# Patient Record
Sex: Male | Born: 1940 | Race: Black or African American | Hispanic: No | Marital: Single | Smoking: Former smoker
Health system: Southern US, Community
[De-identification: ages and names within clinical notes are randomized; demographics above are authoritative.]

## PROBLEM LIST (undated history)

## (undated) DIAGNOSIS — C679 Malignant neoplasm of bladder, unspecified: Secondary | ICD-10-CM

## (undated) DIAGNOSIS — N183 Chronic kidney disease, stage 3 unspecified: Secondary | ICD-10-CM

## (undated) DIAGNOSIS — E119 Type 2 diabetes mellitus without complications: Secondary | ICD-10-CM

## (undated) DIAGNOSIS — I739 Peripheral vascular disease, unspecified: Secondary | ICD-10-CM

## (undated) DIAGNOSIS — Z9189 Other specified personal risk factors, not elsewhere classified: Secondary | ICD-10-CM

## (undated) DIAGNOSIS — I10 Essential (primary) hypertension: Secondary | ICD-10-CM

## (undated) DIAGNOSIS — E11319 Type 2 diabetes mellitus with unspecified diabetic retinopathy without macular edema: Secondary | ICD-10-CM

## (undated) DIAGNOSIS — Z87442 Personal history of urinary calculi: Secondary | ICD-10-CM

## (undated) DIAGNOSIS — H409 Unspecified glaucoma: Secondary | ICD-10-CM

## (undated) DIAGNOSIS — Z89519 Acquired absence of unspecified leg below knee: Secondary | ICD-10-CM

## (undated) HISTORY — PX: OTHER SURGICAL HISTORY: SHX169

## (undated) HISTORY — PX: CATARACT EXTRACTION W/ INTRAOCULAR LENS  IMPLANT, BILATERAL: SHX1307

## (undated) HISTORY — PX: TRANSURETHRAL RESECTION OF BLADDER TUMOR: SHX2575

---

## 1996-03-30 HISTORY — PX: BELOW KNEE LEG AMPUTATION: SUR23

## 1997-07-25 ENCOUNTER — Other Ambulatory Visit: Admission: RE | Admit: 1997-07-25 | Discharge: 1997-07-25 | Payer: Self-pay | Admitting: Nephrology

## 1997-07-27 ENCOUNTER — Other Ambulatory Visit: Admission: RE | Admit: 1997-07-27 | Discharge: 1997-07-27 | Payer: Self-pay | Admitting: Nephrology

## 1998-03-27 ENCOUNTER — Ambulatory Visit (HOSPITAL_COMMUNITY): Admission: RE | Admit: 1998-03-27 | Discharge: 1998-03-27 | Payer: Self-pay | Admitting: Gastroenterology

## 2001-03-04 ENCOUNTER — Encounter: Payer: Self-pay | Admitting: Ophthalmology

## 2001-03-07 ENCOUNTER — Ambulatory Visit (HOSPITAL_COMMUNITY): Admission: RE | Admit: 2001-03-07 | Discharge: 2001-03-07 | Payer: Self-pay | Admitting: Ophthalmology

## 2001-05-31 ENCOUNTER — Ambulatory Visit (HOSPITAL_COMMUNITY): Admission: RE | Admit: 2001-05-31 | Discharge: 2001-05-31 | Payer: Self-pay | Admitting: Ophthalmology

## 2002-04-29 ENCOUNTER — Encounter: Payer: Self-pay | Admitting: Nephrology

## 2002-04-29 ENCOUNTER — Ambulatory Visit (HOSPITAL_COMMUNITY): Admission: RE | Admit: 2002-04-29 | Discharge: 2002-04-29 | Payer: Self-pay | Admitting: Nephrology

## 2002-05-08 ENCOUNTER — Ambulatory Visit (HOSPITAL_COMMUNITY): Admission: RE | Admit: 2002-05-08 | Discharge: 2002-05-08 | Payer: Self-pay | Admitting: Ophthalmology

## 2002-05-08 ENCOUNTER — Encounter: Payer: Self-pay | Admitting: Ophthalmology

## 2002-11-27 ENCOUNTER — Ambulatory Visit (HOSPITAL_COMMUNITY): Admission: RE | Admit: 2002-11-27 | Discharge: 2002-11-27 | Payer: Self-pay | Admitting: Ophthalmology

## 2003-02-05 ENCOUNTER — Ambulatory Visit (HOSPITAL_COMMUNITY): Admission: RE | Admit: 2003-02-05 | Discharge: 2003-02-05 | Payer: Self-pay | Admitting: Ophthalmology

## 2004-03-06 ENCOUNTER — Ambulatory Visit (HOSPITAL_COMMUNITY): Admission: RE | Admit: 2004-03-06 | Discharge: 2004-03-06 | Payer: Self-pay | Admitting: Gastroenterology

## 2004-09-22 ENCOUNTER — Ambulatory Visit (HOSPITAL_COMMUNITY): Admission: RE | Admit: 2004-09-22 | Discharge: 2004-09-22 | Payer: Self-pay | Admitting: Ophthalmology

## 2004-10-28 ENCOUNTER — Ambulatory Visit (HOSPITAL_COMMUNITY): Admission: RE | Admit: 2004-10-28 | Discharge: 2004-10-28 | Payer: Self-pay | Admitting: Ophthalmology

## 2005-01-07 ENCOUNTER — Ambulatory Visit (HOSPITAL_COMMUNITY): Admission: RE | Admit: 2005-01-07 | Discharge: 2005-01-07 | Payer: Self-pay | Admitting: Ophthalmology

## 2005-06-09 ENCOUNTER — Ambulatory Visit (HOSPITAL_COMMUNITY): Admission: RE | Admit: 2005-06-09 | Discharge: 2005-06-09 | Payer: Self-pay | Admitting: Ophthalmology

## 2006-04-19 ENCOUNTER — Encounter: Payer: Self-pay | Admitting: Vascular Surgery

## 2006-04-19 ENCOUNTER — Ambulatory Visit: Admission: RE | Admit: 2006-04-19 | Discharge: 2006-04-19 | Payer: Self-pay | Admitting: Orthopedic Surgery

## 2006-04-22 ENCOUNTER — Ambulatory Visit (HOSPITAL_COMMUNITY): Admission: RE | Admit: 2006-04-22 | Discharge: 2006-04-23 | Payer: Self-pay | Admitting: Orthopedic Surgery

## 2006-04-22 HISTORY — PX: OTHER SURGICAL HISTORY: SHX169

## 2006-11-18 ENCOUNTER — Encounter: Admission: RE | Admit: 2006-11-18 | Discharge: 2006-11-18 | Payer: Self-pay | Admitting: Nephrology

## 2008-03-05 ENCOUNTER — Ambulatory Visit (HOSPITAL_COMMUNITY): Admission: RE | Admit: 2008-03-05 | Discharge: 2008-03-05 | Payer: Self-pay | Admitting: Ophthalmology

## 2009-01-07 ENCOUNTER — Encounter: Admission: RE | Admit: 2009-01-07 | Discharge: 2009-01-07 | Payer: Self-pay | Admitting: Orthopedic Surgery

## 2010-02-03 ENCOUNTER — Encounter: Admission: RE | Admit: 2010-02-03 | Discharge: 2010-02-03 | Payer: Self-pay | Admitting: Nephrology

## 2010-03-12 ENCOUNTER — Ambulatory Visit
Admission: RE | Admit: 2010-03-12 | Discharge: 2010-03-13 | Payer: Self-pay | Source: Home / Self Care | Attending: Urology | Admitting: Urology

## 2010-06-10 LAB — GLUCOSE, CAPILLARY
Glucose-Capillary: 171 mg/dL — ABNORMAL HIGH (ref 70–99)
Glucose-Capillary: 226 mg/dL — ABNORMAL HIGH (ref 70–99)

## 2010-06-10 LAB — POCT I-STAT 4, (NA,K, GLUC, HGB,HCT)
Hemoglobin: 11.6 g/dL — ABNORMAL LOW (ref 13.0–17.0)
Potassium: 3.7 mEq/L (ref 3.5–5.1)

## 2010-07-16 ENCOUNTER — Ambulatory Visit (HOSPITAL_BASED_OUTPATIENT_CLINIC_OR_DEPARTMENT_OTHER)
Admission: RE | Admit: 2010-07-16 | Discharge: 2010-07-16 | Disposition: A | Discharge status: Home / Self Care | Payer: Medicare Other | Source: Ambulatory Visit | Attending: Urology | Admitting: Urology

## 2010-07-16 DIAGNOSIS — Z01812 Encounter for preprocedural laboratory examination: Secondary | ICD-10-CM | POA: Insufficient documentation

## 2010-07-16 DIAGNOSIS — C679 Malignant neoplasm of bladder, unspecified: Secondary | ICD-10-CM | POA: Insufficient documentation

## 2010-07-16 LAB — POCT I-STAT 4, (NA,K, GLUC, HGB,HCT)
Glucose, Bld: 127 mg/dL — ABNORMAL HIGH (ref 70–99)
HCT: 35 % — ABNORMAL LOW (ref 39.0–52.0)

## 2010-07-22 NOTE — Op Note (Signed)
NAMEKRISTINE, TILEY NO.:  [id number]  MEDICAL RECORD NO.:  [id number]          PATIENT TYPE:  LOCATION:                                 FACILITY:  PHYSICIAN:  Bertram Millard. Mckaylee Dimalanta, M.D.DATE OF BIRTH:  1941/02/22  DATE OF PROCEDURE:  07/16/2010 DATE OF DISCHARGE:                              OPERATIVE REPORT   PREOPERATIVE DIAGNOSIS:  Recurrent stage Ta, low-grade urothelial carcinoma of the bladder.  POSTOPERATIVE DIAGNOSIS:  Recurrent stage Ta, low-grade urothelial carcinoma of the bladder.  PRINCIPAL PROCEDURE:  Transurethral resection of bladder tumor/cauterization of anterolateral bladder lesion.  SURGEON:  Bertram Millard. Navina Wohlers, MD  ANESTHESIA:  General with LMA.  COMPLICATIONS:  None.  BRIEF HISTORY:  This 70 year old male presents at this time for treatment of recurrent urothelial carcinoma.  He underwent TURBT on March 12, 2010.  Pathology revealed low-grade urothelial papillary carcinoma with several areas of focal high-grade morphology.  There was no evidence of high-grade disease.  Recent followup revealed the patient to have a recurrence in his anterolateral bladder neck.  It was recommended that he undergo resection/cauterization of this.  Risks and complications have been discussed with the patient who presents today for that procedure.  DESCRIPTION OF PROCEDURE:  The patient was identified in the holding area and received preoperative IV antibiotics.  He was taken to the operating room where general anesthetic was administered using the LMA. He was placed in the dorsal lithotomy position.  Genitalia and perineum were prepped and draped.  Time-out was then performed.  The procedure then commenced.  A 28-French resectoscope sheath was placed transurethrally with the obturator.  I then placed the resectoscope element with the cutting loop.  Survey of the bladder was then performed.  There were several areas of old resection scar,  but no evidence of recurrence except for the anterolateral bladder wall at approximately 2 o'clock.  This was pretty tough to get to with the resectoscope, but using suprapubic pressure, I was able to cauterize this area.  The recurrence looked fairly broad, but it was papillary in nature.  Due to the difficulty of resecting this, as the patient had voluntary inspirations, I basically just cauterized this area.  Attempt at resection was made, but because of the patient's breathing, a small perforation was made, and I felt it safest to just cauterize this.  The entire 2-3 cm long and 1 cm wide lesion was then cauterized down to the muscular layer.  One small specimen was primarily just cautery artifact and I did not send that for as permanent specimen.  I copiously irrigated the bladder with water following cauterization. No bleeding was seen.  No other lesions were noted.  At this point, the scope was removed, and a 20-French Foley catheter was placed and the balloon filled with 10 cc of water.  It was hooked to dependent drainage.  The patient tolerated this procedure well.  He was awakened and then taken to PACU in a stable condition.  He will follow up in my office on Aug 07, 2010, and was sent home on hydrocodone and Cipro.     Bertram Millard. Khaleesi Gruel, M.D.  SMD/MEDQ  D:  07/16/2010  T:  07/16/2010  Job:  [id number]  cc:   Mindi Slicker. Lowell Guitar, M.D. Fax: [phone]  Electronically Signed by Marcine Matar M.D. on 07/22/2010 08:36:00 PM

## 2010-08-12 NOTE — Op Note (Signed)
Hector Sloan, HINDERLITER             ACCOUNT NO.:  [id number]   MEDICAL RECORD NO.:  [id number]          PATIENT TYPE:  AMB   LOCATION:  SDS                          FACILITY:  [facility]   PHYSICIAN:  Alford Highland. Rankin, M.D.   DATE OF BIRTH:  November 04, 1940   DATE OF PROCEDURE:  DATE OF DISCHARGE:  03/05/2008                               OPERATIVE REPORT   PREOPERATIVE DIAGNOSIS:  1. 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  2. History of proliferative diabetic retinopathy, left eye and      previous vitrectomy.   POSTOPERATIVE DIAGNOSIS:  1. 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  2. History of proliferative diabetic retinopathy, left eye and      previous vitrectomy.  3. Mild vitreous hemorrhage of left eye from placement and insertion      of pars plana seton.  4. Retained silicone oil in the anterior chamber, which has resolved      with anterior chamber washout today.   PROCEDURE:  1. Posterior vitrectomy - 20 gauge left eye.  2. Insertion of glaucoma aqueous shunt - glaucoma seton, Baerveldt of      pars plana.   SURGEON:  Alford Highland. Rankin, MD   ANESTHESIA:  Local retrobulbar, monitored anesthesia control.   INDICATIONS FOR PROCEDURE:  The patient is a 70 year old man who has  profound visual loss from proliferative diabetic retinopathy and now has  progressive optic nerve peripheral vision loss, optic nerve diameter  from glaucoma, which is on maximum tolerated medical therapy in left  eye, but requires surgical intervention from his underlying scarring  disease.  He understands this is an attempt to place a glaucoma aqueous  shunt, so as to allow for intraocular pressure control measure.  He  understands the risk of anesthesia including rate occurrence of death,  loss to the eye including but not limited to hemorrhage, infection,  scarring, need for another surgery, no change in vision, loss of vision,  progressive disease despite intervention.   PROCEDURE IN  DETAIL:  Appropriate signed consent was obtained. The  patient was taken to the operating room.  In the operating room,  appropriate monitors followed by mild sedation.  A 2% Xylocaine  injected, 5 mL retrobulbar with additional 5 mL laterally in fashion of  modified Darel Hong. The left periocular region was then sterilely prepped  and draped in usual sterile fashion.  The lid speculum applied.  Conjunctival peritomy was then fashioned superiorly.  The superior  rectus and lateral rectus muscles isolated with 2-0 silk ties.  Superior  temporal quadrant was opened.  The BG 102-350 Baerveldt implant was  selected for placement via the pars plana.  This was secured into the  superior temporal quadrant with wings under each of the rectus muscles  as mentioned above.  A 4-mm posterior limbus MVR, 20-gauge MVR blade  were used to create an opening for the foot plate and a seton.  This was  placed and secured in place with three 2-0 nylon sutures.  Thereafter,  the appropriate placement of the foot plate was secured also with a 8-0  nylon suture to the sclera.  The intraocular pressure had been  controlled during this portion of procedure by placement of Lewicky  anterior chamber maintainer prior to placement of the seton into the  vitreous cavity.  Separate 20-gauge incisions were then made in superior  temporal and the superior nasal quadrant, so as to allow vitrectomy.  Vitrectomy was then carried out with the BIOM attachment on the  microscope.  It was clear that there was a small amount of vitreous  hemorrhage that must have been introduced with the glaucoma procedure.  Previous vitreous had been removed.  The preretinal hemorrhage was thus  removed in this fashion.  Peripheral retina inspected and found to be  free of retinal holes and tears.  Prior to placement of seton at the  sclerectomy site, vitrectomy was performed so as to clear any small  residual vitreous on the vitreous base.  This  was not confirmed  visually.   At this time, the instruments were removed from the interior of the eye,  superior sclerotomies were closed with 7-0 Vicryl sutures.  Infusion was  controlled and maintained so as to allow for egress through the seton  with pressure above 30.  This was titrated and suture was placed around  the free tube as so as to control flow below the pressures of 30.   A Tutoplast was then placed overlying the foot plate, secured in place  with 7-0 Vicryl sutures.  The Bugbee had been used to soak the Tutoplast  as well as the implant.  Now, the conjunctiva was then brought for and  closed without tension with multiple interrupted 7-0 Vicryl sutures.  Excellent closures were obtained.  Subconjunctival Decadron applied  inferonasally.  Intraocular pressures were assessed and were adequate.  Sterile patch and Fox shield were applied.  The patient tolerated the  procedure well without complication.  The patient was taken to the  recovery room in good stable condition.      Alford Highland Rankin, M.D.  Electronically Signed     GAR/MEDQ  D:  03/05/2008  T:  03/06/2008  Job:  [id number]

## 2010-08-15 NOTE — Op Note (Signed)
[place]  Patient:    Hector Sloan, Hector Sloan Visit Number: [id number] MRN: [id number]          Service Type: DSU Location: [facility] 2873 [id number] Attending Physician:  Karenann Cai Dictated by:   Marya Landry Carlyle Lipa., M.D. Admit Date:  03/07/2001                             Operative Report  PREOPERATIVE DIAGNOSIS:  Immature cataract, left eye.  POSTOPERATIVE DIAGNOSIS:  Immature cataract, left eye.  PROCEDURE:  Kelman phacoemulsification of cataract, left eye, with intraocular lens implantation.  ANESTHESIA:  Local using Xylocaine 2% with Marcaine 0.75% and Wydase.  JUSTIFICATION FOR PROCEDURE:  This is a 70 year old diabetic gentleman who complains of blurring of vision with difficulty seeing to read.  He has been treated in both eyes with laser treatment for diabetic eye disease.  His visual acuity is best corrected at 20/70 on the right, 20/100 on the left. There are bilateral immature cataracts.  Cataract extraction with intraocular lens implantation is recommended, and he is admitted at this time for that purpose.  DESCRIPTION OF PROCEDURE:  Under the influence of IV sedation and Van Lint akinesia, a retrobulbar anesthesia was given.  The patient was prepped and draped in the usual manner.  The lid speculum was inserted under the upper and lower lid of the left eye, and a 4-0 silk traction suture was passed through the belly of the superior rectus muscle for traction.  A fornix-based conjunctival flap was turned and hemostasis achieved by using cautery.  An incision made in the sclera approximately 1 mm posterior to the limbus.  This incision was dissected down to clear cornea using the crescent blade.  A side port incision was made at the 1:30 clock hour position, and Occucoat was injected into the eye through the side port incision.  The anterior chamber was then entered through the corneoscleral tunnel incision at the  11:30 position using a 2.7 mm keratome.  An anterior capsulotomy was done using a bent 25-gauge needle.  The nucleus was hydrodissected using Xylocaine.  The KPE handpiece was passed into the eye, and the nucleus was emulsified without difficulty.  The residual cortical material was aspirated.  The posterior capsule was polished using the olive-tip polisher.  The wound was widened slightly to accommodate an oval 5 x 6 intraocular lens.  This lens was seated into the eye behind the iris without difficulty.  The anterior chamber was reformed, and the pupil was constricted using Miochol.  The corneoscleral wound was closed by using a single horizontal suture of 10-0 nylon.  Before closing the suture, the residual Occucoat was aspirated from the eye. After it was ascertained that it was watertight, the conjunctiva was closed over the wounds using thermal cautery.  Celestone 1 cc and 0.5 cc of gentamicin were injected subconjunctivally.  Maxitrol ophthalmic ointment and Pilopine ointment were applied along with a patch and Fox shield.  The patient tolerated the procedure well, was discharged to postanesthesia recovery room in satisfactory condition.  He was instructed to rest today, to take Vicodin every four hours as needed for pain, and to see me in my office tomorrow for further evaluation.  DISCHARGE DIAGNOSIS:  Immature cataract, left eye. Dictated by:   Marya Landry Carlyle Lipa., M.D. Attending Physician:  Karenann Cai DD:  03/07/01 TD:  03/07/01 Job: [id number]

## 2010-08-15 NOTE — Op Note (Signed)
Hector Sloan, Hector Sloan             ACCOUNT NO.:  [id number]   MEDICAL RECORD NO.:  [id number]          PATIENT TYPE:  AMB   LOCATION:  SDS                          FACILITY:  [facility]   PHYSICIAN:  Alford Highland. Rankin, M.D.   DATE OF BIRTH:  04/30/1940   DATE OF PROCEDURE:  06/09/2005  DATE OF DISCHARGE:  06/09/2005                                 OPERATIVE REPORT   PREOPERATIVE DIAGNOSES:  1.  Retained silicone oil, foreign body, left eye.  2.  Progressive proliferative diabetic retinopathy, left eye.  3.  Secondary glaucoma, left eye, secondary to #1.   POSTOPERATIVE DIAGNOSES:  1.  Retained silicone oil, foreign body, left eye.  2.  Progressive proliferative diabetic retinopathy, left eye.  3.  Secondary glaucoma, left eye, secondary to #1.  4.  Oil retained in the anterior chamber, left eye.   OPERATION PERFORMED:  1.  Posterior vitrectomy with endolaser panphotocoagulation, left eye.  2.  Removal of retained oil, nonmagnetic foreign body, left eye.  3.  Anterior chamber washout, therapeutic.   SURGEON:  Alford Highland. Rankin, M.D.   ANESTHESIA:  Local retrobulbar with monitored anesthesia control.   INDICATIONS FOR PROCEDURE:  The patient is a 70 year old man who has complex  retinal detachment, proliferative diabetic retinopathy, tractional  detachment requiring previous vitrectomy, placement of silicone oil in the  vitreous cavity.  He now has early signs of secondary glaucoma with  elevation of intraocular pressure.  This is an attempt to remove the oil and  to diminish that risk of secondary glaucoma developing and worsening.  The  patient understands the risks of anesthesia including the rare occurrence of  death but also to the eye including but not limited to hemorrhage,  infection, scarring, need for another surgery, no change in vision, loss of  vision and progression of disease despite intervention.  After appropriate  signed consent was obtained, the patient was taken to the  operating room.   DESCRIPTION OF PROCEDURE:  In the operating room, appropriate monitoring was  followed by mild sedation.  0.75% Marcaine was delivered 5 mL retrobulbar  followed by an additional 5 mL laterally in the fashion of modified Darel Hong.  The  periocular region was sterilely prepped and draped in the usual  ophthalmic fashion.  A lid speculum was applied.  Conjunctival peritomy was  fashioned in each of the quadrants with the exception of inferonasally.  The  infusion cannula was placed in the infratemporal quadrant.  Placement in the  vitreous cavity verified visually.  At this time, viscous fluid extraction  was then carried out with an 18 gauge needle attached.  The oil was removed  rapidly and quickly.  Multiple emulsified bubbles were found in the vitreous  cavity.  Multiple fluid-air exchanges were then carried out to help mobilize  and to remove the surface oil that accumulates.  Under fluid, endolaser  panphotocoagulation was attempted and portions were completed but there did  appear later to be a laser failure.  This portion of the procedure had to be  halted.  There was no bleeding.  There was  no active NV.  An area nasal to  the optic nerve that had not been previously treated, would need to be  treated and was done partially so.  At this time  superior sclerotomies were then closed with 7-0 Vicryl.  The infusion  removed and similarly closed after an anterior chamber  washout had been  carried out.  Conjunctiva closed with 7-0 Vicryl.  Subconjunctival injection  of antibiotic and steroid applied.  The patient tolerated the procedure well  without complication.      Alford Highland Rankin, M.D.  Electronically Signed     GAR/MEDQ  D:  06/09/2005  T:  06/10/2005  Job:  [id number]
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
## 2010-08-15 NOTE — Op Note (Signed)
Hector Sloan, Hector Sloan NO.:  [id number]   MEDICAL RECORD NO.:  [id number]                   PATIENT TYPE:  OIB   LOCATION:  2861                                 FACILITY:  [facility]   PHYSICIAN:  Alford Highland. Rankin, M.D.                DATE OF BIRTH:  08/31/1940   DATE OF PROCEDURE:  05/08/2002  DATE OF DISCHARGE:                                 OPERATIVE REPORT   PREOPERATIVE DIAGNOSES:  1. Retinal detachment, left eye.  2. Vitreous hemorrhage, left eye, secondary to No. 1 and No. 4.  3. 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  4. Progressive proliferative diabetic retinopathy, left eye.   POSTOPERATIVE DIAGNOSES:  1. Retinal detachment, left eye.  2. Vitreous hemorrhage, left, eye, secondary to No. 1 and No. 4.  3. Rhegmatogenous, left eye, secondary to No. 1.  4. Progressive proliferative diabetic retinopathy, left eye.   PROCEDURES:  1. Posterior vitrectomy with membrane peel, left eye.  2. Endolaser photocoagulation, left eye.  3. Injection of vitreous substitute to reattached retina using SF6 by 15%.   SURGEON:  Alford Highland. Rankin, M.D.   ANESTHESIA:  Local retrobulbar anesthesia with control.   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  This is an attempt to release the dense  fibrovascular attachments in these areas and to reattach retina, as well as  preserve visual functioning and induce quiescence of proliferative diabetic  retinopathy.  He understands the risks of anesthesia including, but not  limited to, hemorrhage, infection, scarring, need for more surgery, no  change in vision, loss of vision, progression of disease despite  intervention, as well as death under the anesthesia.  The ocular conditions  are  obvious side effects of the progress disease and/or surgical  intervention in this attempt to induce quiescence.  He wishes to proceed  with anesthesia as well as surgical repair.   DESCRIPTION OF PROCEDURE:  After appropriate signed consent was obtained he  was taken to the operating room.  In the operating room, appropriate  monitoring was followed by mild sedation.  Then, 0.5% Marcaine 5 cc.  retrobulbar with additional 5 cc. in the fashion of modified Darel Hong.  The  left periocular regions were sterilely prepped and draped in the usual  sterile fashion.  A lid speculum was applied.  Conjunctival peritomy was  fashioned temporally and superonasally.  A 4 mm. infusion was secured 3.5  mm. posterior to the limbus in the inferotemporal quadrant.  Placement in  the vitreous cavity verified visually.  The superior sclerotomy was then  fashioned.  The [employer] was placed into position and BIOM attached.  Core vitrectomy was then begun.  Modified En bloc dissection was then  carried out to remove the dense fibrovascular attachments along the  supratemporal, infratemporal and the nasal vascular cage.  High anterior  posterior traction was released.  The vitreous skirt was trimmed 360 degrees  using scleral depression as well.  At this time, fluid exchange was then  carried out through the base of the retinal holes at the base of the  fibrovascular proliferations.  Thereafter, fluid air exchange was then  completed.  The retinal reattached nicely.  Endolaser photocoagulation was  then carried out peripherally, as well in the bed of the previous  detachments. The retina was reattached nicely.   At this time the instruments were removed from the eye.  Superior sclerotomy  was then closed.  An air _____ [facility] by 15% exchange was then completed.  At  this time the remaining sclerotomies were then closed, and the infusion  removed and similarly closed with 7-0 Vicryl suture. The conjunctivae  closed  with 7-0 Vicryl suture.  Subconjunctival injection of antibiotic and steroid  applied.  The patient tolerated the procedure without complication.                                                 Alford Highland Rankin, M.D.    GAR/MEDQ  D:  05/08/2002  T:  05/08/2002  Job:  [id number]

## 2010-08-15 NOTE — Op Note (Signed)
Hector Sloan, Hector Sloan NO.:  [id number]   MEDICAL RECORD NO.:  [id number]          PATIENT TYPE:  INP   LOCATION:  2899                         FACILITY:  [facility]   PHYSICIAN:  Alford Highland. Rankin, M.D.   DATE OF BIRTH:  April 08, 1940   DATE OF PROCEDURE:  01/07/2005  DATE OF DISCHARGE:  01/07/2005                                 OPERATIVE REPORT   PREOPERATIVE DIAGNOSIS:  1.  Neovascular glaucoma, OD.  2.  Progressive proliferative diabetic retinopathy OD despite previous      panphotocoagulation.  3.  Recent pseudophakia, OD.  4.  Vitreous hemorrhage, OD.   POSTOPERATIVE DIAGNOSIS:  1.  Neovascular glaucoma, OD.  2.  Progressive proliferative diabetic retinopathy OD despite previous      panphotocoagulation.  3.  Recent pseudophakia, OD.  4.  Vitreous hemorrhage, OD.   PROCEDURE:  1.  Posterior vitrectomy with endolaser panphotocoagulation, OD.  2.  Insertion of glaucoma-aqueous shunt, Baerveldt via the pars plana, OD.   ANESTHESIA:  Local retrobulbar block with monitored anesthesia care.   SURGEON:  Alford Highland. Rankin, M.D.   INDICATIONS FOR PROCEDURE:  The patient is a 70 year old man with recent  pseudophakia and rapid progressive diabetic retinopathy with neovascular  glaucoma and iris rubeosis.  This is an attempt to therapeutically lower the  intraocular pressure but also to deliver definitive panphotocoagulation  additional to previous that had been applied in order to induce quiescence  of diabetic retinopathy and to salvage the globe and to salvage useful  vision in this right eye.  The patient understands the risks of anesthesia  including the rare occurrence of death and also to the eye including but not  limited to hemorrhage, infection, scarring, need for further surgery, no  change in vision, loss of vision, progression of disease despite  intervention.  Appropriate signed consent was obtained.  The patient was  taken to the operating room.   DESCRIPTION OF PROCEDURE:  In the operating room, appropriate monitoring was  followed by mild sedation.  0.75% Marcaine was delivered 5 mL retrobulbar  and an additional 5 mL laterally fashioned in modified [employer]  The right  periocular region was sterilely prepped and draped in the usual ophthalmic  fashion.  A lid speculum was applied.  A conjunctival peritomy was then  fashioned superonasally and temporally.  Inferotemporally, as well.  The  infusion cannula was placed 3.5 mm posterior to the limbus in the  inferotemporal quadrant.  Placement was verified visually in the vitreous  cavity.  A superior sclerotomy was fashioned.  The rectus muscle was  isolated on 2-0 silk ties, the superior and lateral rectus muscles prior to  the placement of the superior sclerotomy.  At this time, core vitrectomy was  then begun using the BIOM attachment to the microscope.  It was necessary to  create iatrogenic posterior hyaloid detachment with active suction nasal to  the optic nerve and the hyaloid was then elevated off the optic nerve,  posterior pole, and elevated anterior to the equator 360 degrees.  The  vitreous skirt trimmed 360 degrees.  At this  time, endolaser  photocoagulation was placed 360 degrees.   At this time, care was taken to make sure that the superior vitreous was  clear of the superotemporal sclerotomy.  The instruments were removed from  the eye and the superonasal sclerotomy was closed with 7-0 Vicryl.  The  Baerveldt implant was placed, the wings were placed under each of the rectus  muscles in the superotemporal quadrant and the footplate was secured and fed  to the pars plana and secured with 8-0 nylon.  The implant had been  irrigated previous to this with BSS.  At this time, the baseplate was also  secured with 8-0 nylon with the appropriate tension applied to the tube.  A  ligature of 7-0 Vicryl was placed along the tube to allow for egress of  aqueous and fluid from  the vitreous cavity at pressures approximately above  30.  At this time, a Tutoplast was used to cover the footplate and secured  with vertical mattress sutures of 8-0 nylon.  The conjunctiva was then  irrigated with the bug juice.  The conjunctiva was then brought forward and  closed to the limbus with interrupted 7-0 Vicryl suture.  The 2-0 silks had  been previously removed.  The infusion was also removed and similarly closed  with 7-0 Vicryl.   At this time, subconjunctival injection of antibiotics were applied.  A  sterile patch and Fox shield were applied to the right eye.  The patient was  taken to the PACU in good, stable condition.      Alford Highland Rankin, M.D.  Electronically Signed     GAR/MEDQ  D:  01/07/2005  T:  01/07/2005  Job:  [id number]   cc:   Salley Scarlet., M.D.  Fax: [phone]

## 2010-08-15 NOTE — Op Note (Signed)
Hector Sloan, Hector Sloan NO.:  [id number]   MEDICAL RECORD NO.:  [id number]                   PATIENT TYPE:  OIB   LOCATION:  2899                                 FACILITY:  [facility]   PHYSICIAN:  Alford Highland. Rankin, M.D.                DATE OF BIRTH:  [birth year]   DATE OF PROCEDURE:  11/27/2002  DATE OF DISCHARGE:                                 OPERATIVE REPORT   PREOPERATIVE DIAGNOSES:  1. Epiretinal membrane, left eye, secondary to old fibrovascular     proliferative diabetic retinopathy.  2. Traction retinal detachment, macular region, left eye, as verified by     ultrasound and optical coherence tomography (OCT) testing.   POSTOPERATIVE DIAGNOSES:  1. Epiretinal membrane, left eye, secondary to old fibrovascular     proliferative diabetic retinopathy.  2. Traction retinal detachment, macular region, left eye, as verified by     ultrasound and optical coherence tomography (OCT) testing.   PROCEDURES:  1. Posterior vitrectomy with membrane peel, left eye.  2. Endolaser panretinal photocoagulation, left eye.  3. Injection of vitreous substitute, SF6 15%, OS.   SURGEON:  Alford Highland. Rankin, M.D.   ANESTHESIA:  Local retrobulbar with monitored anesthesia control.   INDICATION FOR PROCEDURE:  The patient is a 70 year old man who has  persistent profound vision loss on the basis of traction retinal detachment  in the macular region secondary to recurrent fibrosis, progressive over the  macular region from the underlying disease of proliferative diabetic  retinopathy.  Status post previous vitrectomy and laser photocoagulation.  The patient understands that this is an attempt to release the traction  changes, flatten the surface of the macula so as to allow for the retina to  flatten if not to at least stabilize vision and perhaps improve the vision.  He understands the risks of anesthesia, including the rare occurrence of  death, and losses to the eye,  including but not limited to hemorrhage,  infection, scarring, need for another surgery, no change in vision, loss of  vision, and progression of disease despite intervention.  After appropriate  signed consent was obtained he was taken to the operating room.   In the operating room appropriate monitoring was followed by mild sedation.  Marcaine 0.75% delivered 5 mL retrobulbar followed by an additional 5 mL  laterally in the fashion of modified Darel Hong.  The left periocular region  was sterilely prepped and draped in the usual ophthalmic fashion.  A lid  speculum applied.  A conjunctival peritomy was then fashioned in each of the  quadrants with the exception of the inferonasal.  A 4 mm infusion was  secured 3.5 mm posterior to the limbus in the inferotemporal quadrant.  Placement in the vitreous cavity verified visually.  Superior sclerotomy was  then fashioned, the wall microscope placed in position with BIOM attached.  Core vitrectomy was then begun.  The previous vitrectomy  had previously been  completed.  Vitreous skirt was then inspected and found to be free of  retinal holes or tears and further trimmed slightly.  The [facility] forceps were  then used to engage the epiretinal membrane which was in a plaque-like  fashion along the superotemporal arcade and superior to the temporal arcade.  Mobilization allowed for use of modified en bloc technique to remove these  portions of the membrane with scissors delamination.   No complications occurred.  At this time a fluid-air exchange then completed  to flatten the retina.  This was done without difficulty.  Endolaser  photocoagulation was then placed in the bed of the macular detachment  temporal to the foveal region.  It was also carried out superiorly.   At this time the instruments were removed from the eye.  The superior  sclerotomy was closed with 7-0 Vicryl suture.  The infusion removed and  similarly closed with 7-0 Vicryl suture.   Conjunctiva closed.  Subconjunctival injections of antibiotic and steroid applied.  It must be  noted that after the air-fluid exchange had been completed that an air-SF6  15% exchange had been completed.   At this time a sterile patch and Fox shield applied to the left eye.  The  patient tolerated the procedure well without complication.  Discharged as an  outpatient to the short stay unit.                                               Alford Highland Rankin, M.D.    GAR/MEDQ  D:  11/27/2002  T:  11/27/2002  Job:  [id number]

## 2010-08-15 NOTE — Op Note (Signed)
Hector Sloan, Hector Sloan             ACCOUNT NO.:  [id number]   MEDICAL RECORD NO.:  [id number]          PATIENT TYPE:  AMB   LOCATION:  ENDO                         FACILITY:  [facility]   PHYSICIAN:  Jordan Hawks. Elnoria Howard, MD    DATE OF BIRTH:  Aug 05, 1940   DATE OF PROCEDURE:  03/06/2004  DATE OF DISCHARGE:                                 OPERATIVE REPORT   PROCEDURE:  Colonoscopy.   INDICATIONS:  Screening and family history of colon cancer.   REFERRING PHYSICIAN:  Merlene Laughter. Renae Gloss, M.D.   CONSENT:  Informed consent was obtained from the patient, describing the  risks of bleeding, infection, perforation, medication reactions, a 10% miss  rate for a small colon cancer or polyp, the risk of death, all of which are  not exclusive of any other complications that may occur.   PHYSICAL EXAMINATION:  CARDIAC:  Regular rate and rhythm.  CHEST:  Lungs are clear to auscultation bilaterally.  ABDOMEN:  Soft, nontender, nondistended.   MEDICATIONS:  75 mcg of fentanyl IV and 7 mg of Versed IV.   PROCEDURE:  After adequate sedation was achieved, a rectal examination was  performed, which was negative for any palpable abnormalities.  The  colonoscope was then inserted under direct visualization to the terminal  ileum with ease.  The patient was noted to have a very good prep.  Upon slow  withdrawal of the colonoscope, there was no evidence of any masses, polyps,  inflammation, ulcerations, erosions, diverticula, or vascular abnormalities  in the cecum, ascending, transverse, descending, or sigmoid colon.  Retroflexion in the rectum revealed large internal and external hemorrhoids.  The colonoscope was then straightened and withdrawn from the patient.   PLAN:  1.  Repeat colonoscopy in five years because of the family history of colon      cancer.  2.  Maintain a high-fiber diet.       PDH/MEDQ  D:  03/06/2004  T:  03/06/2004  Job:  [id number]   cc:   Merlene Laughter. Renae Gloss, M.D.  [address]  [address]  [place]  [place] [address]  Fax: [phone]

## 2010-08-15 NOTE — Op Note (Signed)
NAMEREYNALD, WOODS NO.:  [id number]   MEDICAL RECORD NO.:  [id number]          PATIENT TYPE:  OIB   LOCATION:  2872                         FACILITY:  [facility]   PHYSICIAN:  Salley Scarlet., M.D.DATE OF BIRTH:  June 16, 1940   DATE OF PROCEDURE:  09/22/2004  DATE OF DISCHARGE:  09/22/2004                                 OPERATIVE REPORT   PREOPERATIVE DIAGNOSIS:  Immature cataract right eye.   POSTOPERATIVE DIAGNOSIS:  Immature cataract right eye.   OPERATION:  Extracapsular cataract extraction with intraoperative lens  implantation.   ANESTHESIA:  Local with the use of Xylocaine, 2% Marcaine, 0.75% Wydase.   JUSTIFICATION FOR PROCEDURE:  This is a 70 year old gentleman with diabetes  and glaucoma who underwent a cataract extraction from the left eye several  years ago.  He subsequently developed a retinal detachment in that eye  associated with severe diabetic retinopathy.  He has had retinal detachment  surgery as well as treatment of his diabetic eye disease by Dr. Luciana Axe.  He  has had a gradual decrease in visual acuity on the right side so that now he  has a visual acuity best corrected to 20/100 on the right and hand motion on  the left.  He has a dense posterior subcapsular cataract on the right,  intraocular lens present on the left.  The patient requested the cataract to  be extracted from the right eye.  After thoroughly counseling him of the  risks and benefits of cataract surgery in the only eye, the patient is  admitted now for cataract surgery with intraocular lens implantation.   PROCEDURE:  Under the influence of IV sedation, the Van Lint akinesia and  retrobulbar anesthesia was given.  The patient was prepped and draped in the  usual manner.  A lid speculum was inserted under the upper and lower lids of  the right eye, and a 4-0 silk traction suture was passed through the belly  of the superior rectus muscle retraction.  A fornix-based  conjunctival flap  was turned, and hemostasis was achieved using cautery.  An incision was made  in the sclera at the limbus.  This incision was dissected down into clear  cornea using a crescent blade.  A flap over the incision was made at the  1:30 o'clock position.  Ocucoat was injected into the eye through the  sideport incision.  Anterior chamber was then entered through the  corneoscleral tunnel incision at the 11:30 o'clock position.  An anterior  capsulotomy was then done with a bent 25-gauge needle.  The nucleus was  hydrosected using Xylocaine.  The lens was then begun to be emulsified with  the KPE handpiece, but then the pupil began to be extremely myotic.  Although the phacoemulsification procedure continued, we could not prolapse  the nucleus into the anterior chamber.  Therefore, the decision was made to  convert the procedure to an extracapsular procedure so that the remaining  nucleus could be expressed from the eye.  The corneoscleral wound was then  extended first toward the left, then toward the right, placing a single 8-0  Vicryl suture across the top of the incision, as it was made toward the  left, then toward the right.  The nucleus was then manually expressed from  the eye.  The two previously placed sutures were closed with a third one  placed at the 12:00 position.  The I/A handpiece was passed into the eye.  The residual material was aspirated.  The posterior capsule was polished  with the olive tipped polisher.  The eye was filled with Ocucoat, and an  EZ60 PMMA lens was seated into the posterior chamber without difficulty.  The anterior chamber was reformed and the pupil was constricted using  Miochol.  The corneoscleral wound was closed by using a combination of  interrupted sutures of 8-0 Vicryl and 10-0 nylon.  After ascertaining that  the wound was airtight and watertight, the conjunctiva was closed using  thermo cautery.  One cc of Celestone and 0.5 cc of  gentamicin were injected  in the subconjunctiva.  Maxitrol ophthalmic ointment and prilocaine ointment  were applied along with the patch and Fox shield.  The patient tolerated the  procedure well and was discharged to the postanesthesia care unit in  satisfactory condition.  He is instructed to keep the eye patched today, to  take Vicodin q.4 h. as needed for pain.  He is to have assistance for 24  hours around the clock today because he is unable to see from the other eye,  and his good eye is patched.  He is to see me in the office tomorrow for  further evaluation.   DISCHARGE DIAGNOSIS:  Immature cataract right eye.       TB/MEDQ  D:  09/22/2004  T:  09/23/2004  Job:  [id number]

## 2010-08-15 NOTE — Op Note (Signed)
NAMEADOLF, Hector Sloan                         ACCOUNT NO.:  [id number]   MEDICAL RECORD NO.:  [id number]                   PATIENT TYPE:  OIB   LOCATION:  2550                                 FACILITY:  [facility]   PHYSICIAN:  Alford Highland. Rankin, M.D.                DATE OF BIRTH:  June 12, 1940   DATE OF PROCEDURE:  02/05/2003  DATE OF DISCHARGE:  02/05/2003                                 OPERATIVE REPORT   PREOPERATIVE DIAGNOSIS:  1. Recurrent tractional rhegmatogenous detachment, left eye - macula.  2. Progressive proliferative diabetic retinopathy, left eye.   POSTOPERATIVE DIAGNOSIS:  1. Recurrent tractional rhegmatogenous detachment, left eye - macula.  2. Progressive proliferative diabetic retinopathy, left eye.   PROCEDURE:  1. Posterior vitrectomy with membrane peel left eye.  2. Endolaser pan photocoagulation left eye with reattachment of complex     retinal detachment.  3. Injection of vitreous substitute - silicon 1000 centistokes.   SURGEON:  Alford Highland. Rankin, M.D.   ANESTHESIA:  Local retrobulbar with monitored anesthesia care.   INDICATIONS FOR PROCEDURE:  The patient is a 70 year old man with profound  vision loss on the basis of combined tractional retinal detachment of the  left eye, recurrent.  This is an attempt to reattach the retina, removing  proliferative vitreal retinopathy tissues and to hold this in place with  laser retinopexy followed by instillation of permanent vitreous substitute.   The patient understands the risks of anesthesia including the rare  occurrence of death, but also to the eye including, but not limited to  hemorrhage, infection, scarring, need for another surgery, no change in  vision, loss of vision, and progression despite intervention.  Appropriate  signed consent was obtained.  The patient was taken to the operating room.   DESCRIPTION OF PROCEDURE:  In the operating room, appropriate monitors  followed by mild sedation.  0.75%  Marcaine delivered 5 mL retrobulbar and  additional 5 mL laterally in fashion modified Darel Hong.  Left periocular  region was sterilely prepped and draped in the usual ophthalmic fashion.  Lid speculum applied.  Conjunctival peritomy was then fashioned temporally  and superonasally.  A 4 mm infusion was secured 3.5 mm posterior to the  limbus in the inferotemporal quadrant.  Placement in the vitreous cavity  verified visually.  Superior sclerotomies were then fashion.  Wilde  microscope placed in position with Biome attachment.  Core vitrectomy was  then begun.  This had been done previously.  The vitreous skirt trimmed 360  degrees.  At this time, a large fibrovascular attachment which was atrophic  and overlying the retina which appeared to be contractile in nature and  occluding retinal attachment was then removed in modified en block  technique. This was carried out without difficulty.  Fluid/air exchange was  then completed.  The retina was reattached nicely.  Endolaser  photocoagulation was then applied in a very tight pattern  around the macula  posterior pole.  An air to silicon exchange 1000 centistokes was then  carried out.  The superior sclerotomies were then closed with 7-0 Vicryl  suture.  The infusion was removed and similarly closed with 7-0 Vicryl  suture.  At this time the superior sclerotomies were closed with 7-0 Vicryl.  Infusion  removed and similarly closed with 7-0 Vicryl.  The conjunctiva closed with 7-  0 Vicryl.  Subconjunctival injection with antibiotics and steroid applied.  The patient tolerated the procedure well without complications.  Taken to  the shortstay to be discharged and followed as an outpatient.                                               Alford Highland Rankin, M.D.    GAR/MEDQ  D:  02/05/2003  T:  02/05/2003  Job:  [id number]

## 2010-12-03 ENCOUNTER — Ambulatory Visit (HOSPITAL_BASED_OUTPATIENT_CLINIC_OR_DEPARTMENT_OTHER)
Admission: RE | Admit: 2010-12-03 | Discharge: 2010-12-03 | Disposition: A | Discharge status: Home / Self Care | Payer: Medicare Other | Source: Ambulatory Visit | Attending: Urology | Admitting: Urology

## 2010-12-03 DIAGNOSIS — I10 Essential (primary) hypertension: Secondary | ICD-10-CM | POA: Insufficient documentation

## 2010-12-03 DIAGNOSIS — E119 Type 2 diabetes mellitus without complications: Secondary | ICD-10-CM | POA: Insufficient documentation

## 2010-12-03 DIAGNOSIS — Z01812 Encounter for preprocedural laboratory examination: Secondary | ICD-10-CM | POA: Insufficient documentation

## 2010-12-03 DIAGNOSIS — C675 Malignant neoplasm of bladder neck: Secondary | ICD-10-CM | POA: Insufficient documentation

## 2010-12-03 LAB — POCT I-STAT 4, (NA,K, GLUC, HGB,HCT)
HCT: 37 % — ABNORMAL LOW (ref 39.0–52.0)
Hemoglobin: 12.6 g/dL — ABNORMAL LOW (ref 13.0–17.0)
Sodium: 137 mEq/L (ref 135–145)

## 2011-01-01 LAB — CBC
HCT: 39.5 % (ref 39.0–52.0)
Hemoglobin: 13 g/dL (ref 13.0–17.0)
MCV: 87.6 fL (ref 78.0–100.0)
RBC: 4.51 MIL/uL (ref 4.22–5.81)
RDW: 15.1 % (ref 11.5–15.5)

## 2011-01-01 LAB — BASIC METABOLIC PANEL
Calcium: 9.2 mg/dL (ref 8.4–10.5)
Potassium: 4.4 mEq/L (ref 3.5–5.1)
Sodium: 137 mEq/L (ref 135–145)

## 2011-01-01 LAB — GLUCOSE, CAPILLARY: Glucose-Capillary: 100 mg/dL — ABNORMAL HIGH (ref 70–99)

## 2011-01-01 NOTE — Op Note (Signed)
  NAMECASTEN, Hector Sloan NO.:  [id number]  MEDICAL RECORD NO.:  [id number]  LOCATION:                                 FACILITY:  PHYSICIAN:  Bertram Millard. Kerrington Sova, M.D.  DATE OF BIRTH:  DATE OF PROCEDURE:  12/03/2010 DATE OF DISCHARGE:                              OPERATIVE REPORT   PREOPERATIVE DIAGNOSIS:  Recurrent urothelial carcinoma of the bladder.  POSTOPERATIVE DIAGNOSIS:  Recurrent urothelial carcinoma of the bladder.  PRINCIPAL PROCEDURE:  TURBT, 3 cm lesion/bladder tumor fulguration.  SURGEON:  Bertram Millard. Amai Cappiello, M.D.  ANESTHESIA:  General LMA.  COMPLICATIONS:  None.  BRIEF HISTORY:  A 70 year old male who has recurrent urothelial carcinoma of the bladder.  He has had an initial resection of a very large tumor in 2011.  He had a recurrence in April 2012 and underwent repeat TURBT.  He was found on recent cystoscopy to have an anterior bladder recurrence.  There were several small tumors as well as a "carpet-like" effect with a papillary carcinoma.  At this point, as recommended that he undergo repeat resection.  He is aware of risks and complications and desires to proceed.  DESCRIPTION OF PROCEDURE:  The patient was identified in the holding area.  He received preoperative IV antibiotics.  He was taken to the operating room where general anesthetic was administered using LMA.  He was placed in the dorsal lithotomy position.  Genitalia and perineum were prepped and draped.  Time-out was then performed.  Procedure was then commenced.  28-French resectoscope sheath was then placed within the bladder.  The resectoscope loop was then placed.  The bladder was inspected circumferentially.  There were no further tumors either in the posterolateral bladder.  There were none at the dome, but anteriorly at the bladder neck, there was an approximate 2.5-3.0 cm area of recurrence.  There were several small papillary tumors, but most of the recurrence  was very low lying and had a carpet-like defect.  I used the loop to resect couple of the small papillary areas.  I was unable to recover any of these biopsies, however.  The cutting loop was then used to cauterize the entire recurrent area.  It was quite difficult to get through some of these, and I had to totally empty the bladder to get through some of these areas, as they were quite anterior.  Following adequate fulguration/resection, I did not see any further tumors.  At this point, the bladder was irrigated copiously with water and I eventually placed a 20-French Foley catheter.  The patient tolerated the procedure well.  The patient was awakened and then taken to PACU in stable condition.     Bertram Millard. Retta Diones, M.D.     SMD/MEDQ  D:  12/03/2010  T:  12/04/2010  Job:  [id number]  Electronically Signed by Marcine Matar M.D. on 01/01/2011 05:27:33 AM

## 2011-05-22 ENCOUNTER — Other Ambulatory Visit: Payer: Self-pay | Admitting: Urology

## 2011-05-28 ENCOUNTER — Encounter (HOSPITAL_BASED_OUTPATIENT_CLINIC_OR_DEPARTMENT_OTHER): Payer: Self-pay | Admitting: *Deleted

## 2011-05-29 ENCOUNTER — Encounter (HOSPITAL_BASED_OUTPATIENT_CLINIC_OR_DEPARTMENT_OTHER): Payer: Self-pay | Admitting: *Deleted

## 2011-05-29 NOTE — Progress Notes (Signed)
To [facility] at 1100.Istat,Ekg on arrival.Npo after mn-

## 2011-06-03 ENCOUNTER — Encounter (HOSPITAL_BASED_OUTPATIENT_CLINIC_OR_DEPARTMENT_OTHER): Payer: Self-pay | Admitting: Anesthesiology

## 2011-06-03 ENCOUNTER — Ambulatory Visit (HOSPITAL_BASED_OUTPATIENT_CLINIC_OR_DEPARTMENT_OTHER)
Admission: RE | Admit: 2011-06-03 | Discharge: 2011-06-03 | Disposition: A | Discharge status: Home Health | Payer: Medicare Other | Source: Ambulatory Visit | Attending: Urology | Admitting: Urology

## 2011-06-03 ENCOUNTER — Encounter (HOSPITAL_BASED_OUTPATIENT_CLINIC_OR_DEPARTMENT_OTHER): Payer: Self-pay | Admitting: *Deleted

## 2011-06-03 ENCOUNTER — Encounter (HOSPITAL_BASED_OUTPATIENT_CLINIC_OR_DEPARTMENT_OTHER)
Admission: RE | Disposition: A | Discharge status: Home Health | Payer: Self-pay | Source: Ambulatory Visit | Attending: Urology

## 2011-06-03 ENCOUNTER — Other Ambulatory Visit: Payer: Self-pay

## 2011-06-03 ENCOUNTER — Ambulatory Visit (HOSPITAL_BASED_OUTPATIENT_CLINIC_OR_DEPARTMENT_OTHER): Payer: Medicare Other | Admitting: Anesthesiology

## 2011-06-03 DIAGNOSIS — I10 Essential (primary) hypertension: Secondary | ICD-10-CM | POA: Insufficient documentation

## 2011-06-03 DIAGNOSIS — Z7982 Long term (current) use of aspirin: Secondary | ICD-10-CM | POA: Insufficient documentation

## 2011-06-03 DIAGNOSIS — E119 Type 2 diabetes mellitus without complications: Secondary | ICD-10-CM | POA: Insufficient documentation

## 2011-06-03 DIAGNOSIS — C679 Malignant neoplasm of bladder, unspecified: Secondary | ICD-10-CM

## 2011-06-03 DIAGNOSIS — Z79899 Other long term (current) drug therapy: Secondary | ICD-10-CM | POA: Insufficient documentation

## 2011-06-03 HISTORY — PX: CYSTOSCOPY WITH BIOPSY: SHX5122

## 2011-06-03 HISTORY — DX: Essential (primary) hypertension: I10

## 2011-06-03 LAB — POCT I-STAT, CHEM 8
Calcium, Ion: 1.29 mmol/L (ref 1.12–1.32)
HCT: 34 % — ABNORMAL LOW (ref 39.0–52.0)
TCO2: 29 mmol/L (ref 0–100)

## 2011-06-03 LAB — GLUCOSE, CAPILLARY: Glucose-Capillary: 150 mg/dL — ABNORMAL HIGH (ref 70–99)

## 2011-06-03 SURGERY — CYSTOSCOPY, WITH BIOPSY
Anesthesia: General | Site: Bladder | Wound class: Clean Contaminated

## 2011-06-03 MED ORDER — FENTANYL CITRATE 0.05 MG/ML IJ SOLN
INTRAMUSCULAR | Status: DC | PRN
  Administered 2011-06-03: 50 ug via INTRAVENOUS
  Administered 2011-06-03 (×2): 25 ug via INTRAVENOUS

## 2011-06-03 MED ORDER — STERILE WATER FOR IRRIGATION IR SOLN
Status: DC | PRN
  Administered 2011-06-03: 3000 mL

## 2011-06-03 MED ORDER — MEPERIDINE HCL 25 MG/ML IJ SOLN: 6.2500 mg | INTRAMUSCULAR | Status: DC | PRN

## 2011-06-03 MED ORDER — FENTANYL CITRATE 0.05 MG/ML IJ SOLN: 25.0000 ug | INTRAMUSCULAR | Status: DC | PRN

## 2011-06-03 MED ORDER — ONDANSETRON HCL 4 MG/2ML IJ SOLN
INTRAMUSCULAR | Status: DC | PRN
  Administered 2011-06-03: 4 mg via INTRAVENOUS

## 2011-06-03 MED ORDER — CEFAZOLIN SODIUM 1-5 GM-% IV SOLN: 1.0000 g | INTRAVENOUS | Status: DC

## 2011-06-03 MED ORDER — BELLADONNA-OPIUM 16.2-30 MG RE SUPP
RECTAL | Status: DC | PRN
  Administered 2011-06-03: 15 mg via RECTAL

## 2011-06-03 MED ORDER — LACTATED RINGERS IV SOLN: INTRAVENOUS | Status: DC

## 2011-06-03 MED ORDER — CEFAZOLIN SODIUM 1-5 GM-% IV SOLN
INTRAVENOUS | Status: DC | PRN
  Administered 2011-06-03: 2 g via INTRAVENOUS

## 2011-06-03 MED ORDER — SULFAMETHOXAZOLE-TRIMETHOPRIM 800-160 MG PO TABS: 1.0000 | ORAL_TABLET | Freq: Two times a day (BID) | ORAL | Status: AC

## 2011-06-03 MED ORDER — LIDOCAINE HCL (CARDIAC) 20 MG/ML IV SOLN
INTRAVENOUS | Status: DC | PRN
  Administered 2011-06-03: 80 mg via INTRAVENOUS

## 2011-06-03 MED ORDER — LACTATED RINGERS IV SOLN
INTRAVENOUS | Status: DC | PRN
  Administered 2011-06-03: 11:00:00 via INTRAVENOUS

## 2011-06-03 MED ORDER — LACTATED RINGERS IV SOLN
INTRAVENOUS | Status: DC
  Administered 2011-06-03: 11:00:00 via INTRAVENOUS

## 2011-06-03 MED ORDER — PROPOFOL 10 MG/ML IV EMUL
INTRAVENOUS | Status: DC | PRN
  Administered 2011-06-03: 180 mg via INTRAVENOUS

## 2011-06-03 MED ORDER — PROMETHAZINE HCL 25 MG/ML IJ SOLN: 6.2500 mg | INTRAMUSCULAR | Status: DC | PRN

## 2011-06-03 SURGICAL SUPPLY — 24 items
BAG DRAIN URO-CYSTO SKYTR STRL (DRAIN) ×2 IMPLANT
BAG URINE DRAINAGE (UROLOGICAL SUPPLIES) ×2 IMPLANT
CANISTER SUCT LVC 12 LTR MEDI- (MISCELLANEOUS) IMPLANT
CANISTER SUCTION 2500CC (MISCELLANEOUS) ×2 IMPLANT
CATH FOLEY 2WAY SLVR  5CC 20FR (CATHETERS) ×1
CATH FOLEY 2WAY SLVR 5CC 20FR (CATHETERS) ×1 IMPLANT
CLOTH BEACON ORANGE TIMEOUT ST (SAFETY) ×2 IMPLANT
DRAPE CAMERA CLOSED 9X96 (DRAPES) ×2 IMPLANT
ELECT REM PT RETURN 9FT ADLT (ELECTROSURGICAL) ×2
ELECTRODE REM PT RTRN 9FT ADLT (ELECTROSURGICAL) ×1 IMPLANT
GLOVE BIO SURGEON STRL SZ 6.5 (GLOVE) ×2 IMPLANT
GLOVE BIO SURGEON STRL SZ8 (GLOVE) ×2 IMPLANT
GLOVE BIOGEL M 6.5 STRL (GLOVE) ×2 IMPLANT
GLOVE ECLIPSE 6.0 STRL STRAW (GLOVE) ×4 IMPLANT
GOWN PREVENTION PLUS LG XLONG (DISPOSABLE) ×2 IMPLANT
GOWN STRL REIN XL XLG (GOWN DISPOSABLE) ×2 IMPLANT
HOLDER FOLEY CATH W/STRAP (MISCELLANEOUS) ×2 IMPLANT
NDL SAFETY ECLIPSE 18X1.5 (NEEDLE) IMPLANT
NEEDLE HYPO 18GX1.5 SHARP (NEEDLE)
NEEDLE HYPO 22GX1.5 SAFETY (NEEDLE) IMPLANT
NS IRRIG 500ML POUR BTL (IV SOLUTION) IMPLANT
PACK CYSTOSCOPY (CUSTOM PROCEDURE TRAY) ×2 IMPLANT
SYR 20CC LL (SYRINGE) IMPLANT
WATER STERILE IRR 3000ML UROMA (IV SOLUTION) ×2 IMPLANT

## 2011-06-03 NOTE — H&P (Signed)
Urology History and Physical Exam  CC: Recurrent bladder cancer  HPI: 71 year old male presents for repeat anesthetic cystoscopy, bladder biopsy and cauterization of bladder tumors.  He originally presented in December of 2011 with multifocal bladder tumors. He underwent TURBT on 03/12/2010. Pathology revealed cautery artifact, but mostly there was a low-grade urothelial, papillary carcinoma present. There were focal areas of high-grade morphology. There was no evidence of invasive carcinoma. He underwent repeat TURBT on 07/16/2010 at that time, his tumors were cauterized as this all seems superficial. He underwent repeat cauterization of superficial tumors on 12/03/2010.  Because of his recurrent urothelial carcinoma, he completed induction BCG on November 52, 2012.  Recent cystoscopy revealed several areas of small, confluent papillary lesions. To me, this appears to be early recurrences of his urothelial carcinoma.   PMH: Past Medical History  Diagnosis Date  . Hx of bladder cancer   . Hypertension   . Diabetes mellitus     oral agents    PSH: Past Surgical History  Procedure Date  . Transurethral resection of bladder tumor 12-03-2010 ,  APR 2012,  2011  . Multiple eye surg's (most of them of left) LAST ONE 2009  VITRECTOMY    INCLUDING VITRECTOMY/ REPAIR DETACHED RETINA  . 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 aspirin 81 MG tablet Take 81 mg by mouth as needed.      . bimatoprost (LUMIGAN) 0.03 % ophthalmic solution 1 drop at bedtime.      . brimonidine-timolol (COMBIGAN) 0.2-0.5 % ophthalmic solution Place 1 drop into both eyes every 12 (twelve) hours.      . cholecalciferol (VITAMIN D) 400 UNITS TABS Take 1,000 Units by mouth. Takes 2000units      . glyBURIDE-metformin (GLUCOVANCE) 5-500 MG per tablet Take 1 tablet by mouth 2 (two) times daily with a meal.       . olmesartan-hydrochlorothiazide (BENICAR HCT) 40-25 MG per tablet Take 1 tablet by mouth daily.      . pioglitazone (ACTOS) 15 MG tablet Take 15 mg by mouth daily.         Social History: History   Social History  . Marital Status: Single    Spouse Name: N/A    Number of Children: N/A  . Years of Education: N/A   Occupational History  . Not on file.   Social History Main Topics  . Smoking status: Former Smoker    Quit date: 05/29/1990  . Smokeless tobacco: Not on file  . Alcohol Use: No  . Drug Use: No  . Sexually Active:    Other Topics Concern  . Not on file   Social History Narrative  . No narrative on file    Family History: History reviewed. No pertinent family history.  Review of Systems: Positive:  Negative: .  A further 10 point review of systems was negative except what is listed in the HPI.  Physical Exam: @VITALS2 @ General: No acute distress.  Awake. Head:  Normocephalic.  Atraumatic. ENT:  EOMI.  Mucous membranes moist. He wears dentures Neck:  Supple.  No lymphadenopathy. CV:  S1 present. S2 present. Regular rate. Pulmonary: Equal effort bilaterally.  Clear to auscultation bilaterally. Abdomen: Soft.  Non- tender to palpation. Skin:  Normal turgor.  No visible rash. Extremity: No gross deformity of bilateral upper extremities.  No gross deformity of    bilateral  lower extremities. Neurologic: Alert. Appropriate mood.  Penis:  Uncircumcised.  No lesions. Urethra:   Orthotopic meatus. Scrotum: No lesions.  No ecchymosis.  No erythema. Testicles: Descended bilaterally.  No masses bilaterally. Epididymis: Palpable bilaterally.  Non Tender to palpation.  Studies:  No results found for this basename: HGB:2,WBC:2,PLT:2 in the last 72 hours  No results found for this basename: NA:2,K:2,CL:2,CO2:2,BUN:2,CREATININE:2,CALCIUM:2,MAGNESIUM:2,GFRNONAA:2,GFRAA:2 in the last 72 hours   No results found for this basename: PT:2,INR:2,APTT:2 in the last  72 hours   No components found with this basename: ABG:2    Assessment:  Recurrent urothelial carcinoma bladder, low grade, noninvasive  Plan: Cystoscopy, bladder biopsy, cautery of recurrent bladder tumors

## 2011-06-03 NOTE — Anesthesia Postprocedure Evaluation (Signed)
  Anesthesia Post-op Note  Patient: Hector Sloan  Procedure(s) Performed: Procedure(s) (LRB): CYSTOSCOPY WITH BIOPSY (N/A)  Patient Location: PACU  Anesthesia Type: General  Level of Consciousness: awake and alert   Airway and Oxygen Therapy: Patient Spontanous Breathing  Post-op Pain: mild  Post-op Assessment: Post-op Vital signs reviewed, Patient's Cardiovascular Status Stable, Respiratory Function Stable, Patent Airway and No signs of Nausea or vomiting  Post-op Vital Signs: stable  Complications: No apparent anesthesia complications

## 2011-06-03 NOTE — Op Note (Signed)
Preoperative diagnosis: Urothelial carcinoma the bladder, low grade, noninvasive, stage TA  Postoperative diagnosis: Same  Procedure: Anesthetic cystoscopy, TURBT of 15 mm bladder tumor, as well as satellite lesions, fulguration of bladder lesions  Surgeon: Skylar Priest  Anesthesia: Gen. with LMA  Specimen: Bladder tumors  Drain: 20 French Foley catheter to bedside bag  Complications: None  Indications: 71 year-old male with a history of recurrent urothelial carcinoma of the bladder. He has had induction BCG which was completed in December, 2012. Followup cystoscopy after that revealed obvious recurrence of bladder tumors. He presents at this time for TURBT and fulguration of what appear to be superficial lesions. He is aware of risks and complications, and desires to proceed.  Description of procedure: The patient was identified in the holding area and received preoperative IV antibiotics. He was taken to the operating room where general anesthetic was administered with the LMA. He was placed in the dorsolithotomy position. Genitalia and perineum were prepped and draped. Timeout was then performed.  A 22 French panendoscope was advanced to the urethra. Urethra was free of lesions or strictures. Prostate was nonobstructive and without lesions. The bladder was entered and inspected circumferentially. There were minimal trabeculations. No foreign bodies were noted. There was one dominant papillary lesion at the midline dome slightly posteriorly, approximately 15 mm in size. There were several other smaller lesions which appear to be recurrent urothelial carcinoma, anteriorly and to the right on the lateral side of the bladder. Approximately 5 cold cup biopsies were taken of the dominant tumor, with what I felt to be muscular biopsies as well. This totally removed the tumor there. The Bugbee electrode was then used to cauterize the base/biopsied area. One other recurrent area was biopsied, on the right  side of the bladder slightly posteriorly. These 2 specimens together were sent as "bladder tumor". The other smaller areas which I felt might represent recurrences were cauterized with the Bugbee electrode. Both the 12 and the 70 lenses were used to identify all areas that needed to be treated. I also used the Bugbee electrode to treat the trigone area in some areas it appeared to be slightly erythematous. These were totally obliterated with cautery. At this point, all urothelium appeared to be normal that remained. I felt that a catheter needed to be left in, and after irrigating the bladder for several minutes with water, a 20 [place], 5 cc balloon Foley catheter was placed and hooked to dependent drainage.  The patient tolerated the procedure well. He was awakened and taken to the PACU in stable condition.

## 2011-06-03 NOTE — Anesthesia Preprocedure Evaluation (Signed)
Anesthesia Evaluation  Patient identified by MRN, date of birth, ID band Patient awake    Reviewed: Allergy & Precautions, H&P , NPO status , Patient's Chart, lab work & pertinent test results  Airway Mallampati: II TM Distance: >3 FB Neck ROM: Full    Dental No notable dental hx.    Pulmonary neg pulmonary ROS,  breath sounds clear to auscultation  Pulmonary exam normal       Cardiovascular hypertension, negative cardio ROS  Rhythm:Regular Rate:Normal     Neuro/Psych negative neurological ROS  negative psych ROS   GI/Hepatic negative GI ROS, Neg liver ROS,   Endo/Other  negative endocrine ROSDiabetes mellitus-  Renal/GU negative Renal ROS  negative genitourinary   Musculoskeletal negative musculoskeletal ROS (+)   Abdominal   Peds negative pediatric ROS (+)  Hematology negative hematology ROS (+)   Anesthesia Other Findings   Reproductive/Obstetrics negative OB ROS                           Anesthesia Physical Anesthesia Plan  ASA: III  Anesthesia Plan: General   Post-op Pain Management:    Induction: Intravenous  Airway Management Planned:   Additional Equipment:   Intra-op Plan:   Post-operative Plan: Extubation in OR  Informed Consent: I have reviewed the patients History and Physical, chart, labs and discussed the procedure including the risks, benefits and alternatives for the proposed anesthesia with the patient or authorized representative who has indicated his/her understanding and acceptance.   Dental advisory given  Plan Discussed with: CRNA  Anesthesia Plan Comments:         Anesthesia Quick Evaluation

## 2011-06-03 NOTE — Progress Notes (Signed)
Report given to Matilde Haymaker for lunch relief

## 2011-06-03 NOTE — Discharge Instructions (Addendum)
1. You may see some blood in the urine and may have some burning with urination for 48-72 hours. You also may notice that you have to urinate more frequently or urgently after your procedure which is normal.   2. You should call should you develop an inability urinate, fever > 101, persistent nausea and vomiting that prevents you from eating or drinking to stay hydrated.   3. You can remove your catheter on Thursday morning. He will be instructed by the nurses how to do this. Cut the small tab off the end of the catheter, near where the catheter drains into the bag tubing. Let all the water drain out, then slowly removed the catheter  4. If you have a catheter, you will be taught how to take care of the catheter by the nursing staff prior to discharge from the hospital.  You may periodically feel a strong urge to void with the catheter in place.  This is a bladder spasm and most often can occur when having a bowel movement or moving around. It is typically self-limited and usually will stop after a few minutes.  You may use some Vaseline or Neosporin around the tip of the catheter to reduce friction at the tip of the penis. You may also see some blood in the urine.  A very small amount of blood can make the urine look quite red.  As long as the catheter is draining well, there usually is not a problem.  However, if the catheter is not draining well and is bloody, you should call the office [phone] to notify us.   5. Take the sulfa pill one twice daily for 3 days.

## 2011-06-03 NOTE — Transfer of Care (Signed)
Immediate Anesthesia Transfer of Care Note  Patient: Hector Sloan  Procedure(s) Performed: Procedure(s) (LRB): CYSTOSCOPY WITH BIOPSY (N/A)  Patient Location: PACU  Anesthesia Type: General  Level of Consciousness: awake, sedated, patient cooperative and responds to stimulation  Airway & Oxygen Therapy: Patient Spontanous Breathing and Patient connected to face mask oxygen  Post-op Assessment: Report given to PACU RN, Post -op Vital signs reviewed and stable and Patient moving all extremities  Post vital signs: Reviewed and stable  Complications: No apparent anesthesia complications

## 2011-06-03 NOTE — Anesthesia Procedure Notes (Signed)
Procedure Name: LMA Insertion Date/Time: 06/03/2011 11:27 AM Performed by: Iline Oven Pre-anesthesia Checklist: Patient identified, Emergency Drugs available, Suction available and Patient being monitored Patient Re-evaluated:Patient Re-evaluated prior to inductionOxygen Delivery Method: Circle System Utilized Preoxygenation: Pre-oxygenation with 100% oxygen Intubation Type: IV induction Ventilation: Mask ventilation without difficulty LMA: LMA with gastric port inserted LMA Size: 5.0 Number of attempts: 1 Placement Confirmation: positive ETCO2 Tube secured with: Tape Dental Injury: Teeth and Oropharynx as per pre-operative assessment

## 2011-06-03 NOTE — Op Note (Signed)
Preoperative diagnosis: Urothelial carcinoma the bladder, low grade, noninvasive, stage TA  Postoperative diagnosis: Same  Procedure: Anesthetic cystoscopy, TURBT of 15 mm bladder tumor, as well as satellite lesions, fulguration of bladder lesions  Surgeon: Marya Lowden  Anesthesia: Gen. with LMA  Specimen: Bladder tumors  Drain: 20 French Foley catheter to bedside bag  Complications: None  Indications: 70 year-old male with a history of recurrent urothelial carcinoma of the bladder. He has had induction BCG which was completed in December, 2012. Followup cystoscopy after that revealed obvious recurrence of bladder tumors. He presents at this time for TURBT and fulguration of what appear to be superficial lesions. He is aware of risks and complications, and desires to proceed.  Description of procedure: The patient was identified in the holding area and received preoperative IV antibiotics. He was taken to the operating room where general anesthetic was administered with the LMA. He was placed in the dorsolithotomy position. Genitalia and perineum were prepped and draped. Timeout was then performed.  A 22 French panendoscope was advanced to the urethra. Urethra was free of lesions or strictures. Prostate was nonobstructive and without lesions. The bladder was entered and inspected circumferentially. There were minimal trabeculations. No foreign bodies were noted. There was one dominant papillary lesion at the midline dome slightly posteriorly, approximately 15 mm in size. There were several other smaller lesions which appear to be recurrent urothelial carcinoma, anteriorly and to the right on the lateral side of the bladder. Approximately 5 cold cup biopsies were taken of the dominant tumor, with what I felt to be muscular biopsies as well. This totally removed the tumor there. The Bugbee electrode was then used to cauterize the base/biopsied area. One other recurrent area was biopsied, on the right  side of the bladder slightly posteriorly. These 2 specimens together were sent as "bladder tumor". The other smaller areas which I felt might represent recurrences were cauterized with the Bugbee electrode. Both the 12 and the 70 lenses were used to identify all areas that needed to be treated. I also used the Bugbee electrode to treat the trigone area in some areas it appeared to be slightly erythematous. These were totally obliterated with cautery. At this point, all urothelium appeared to be normal that remained. I felt that a catheter needed to be left in, and after irrigating the bladder for several minutes with water, a 20 French, 5 cc balloon Foley catheter was placed and hooked to dependent drainage.  The patient tolerated the procedure well. He was awakened and taken to the PACU in stable condition.  

## 2011-06-04 ENCOUNTER — Encounter (HOSPITAL_BASED_OUTPATIENT_CLINIC_OR_DEPARTMENT_OTHER): Payer: Self-pay | Admitting: Urology

## 2011-10-22 ENCOUNTER — Other Ambulatory Visit: Payer: Self-pay | Admitting: Urology

## 2011-10-23 MED ORDER — MITOMYCIN CHEMO FOR BLADDER INSTILLATION 40 MG: 40.0000 mg | Freq: Once | INTRAVENOUS | Status: DC

## 2011-10-26 ENCOUNTER — Encounter (HOSPITAL_BASED_OUTPATIENT_CLINIC_OR_DEPARTMENT_OTHER): Payer: Self-pay | Admitting: *Deleted

## 2011-10-26 NOTE — Progress Notes (Signed)
NPO AFTER MN WITH EXCEPTION CLEAR LIQUIDS UNTIL 0800 (NO CREAM/ MILK PRODUCTS). ARRIVES AT 1300. NEEDS ISTAT. CURRENT EKG IN EPIC AND CHART.

## 2011-10-29 ENCOUNTER — Encounter (HOSPITAL_BASED_OUTPATIENT_CLINIC_OR_DEPARTMENT_OTHER): Payer: Self-pay | Admitting: *Deleted

## 2011-10-29 ENCOUNTER — Encounter (HOSPITAL_BASED_OUTPATIENT_CLINIC_OR_DEPARTMENT_OTHER): Payer: Self-pay | Admitting: Anesthesiology

## 2011-10-29 ENCOUNTER — Ambulatory Visit (HOSPITAL_BASED_OUTPATIENT_CLINIC_OR_DEPARTMENT_OTHER): Payer: Medicare Other | Admitting: Anesthesiology

## 2011-10-29 ENCOUNTER — Encounter (HOSPITAL_BASED_OUTPATIENT_CLINIC_OR_DEPARTMENT_OTHER)
Admission: RE | Disposition: A | Discharge status: Home / Self Care | Payer: Self-pay | Source: Ambulatory Visit | Attending: Urology

## 2011-10-29 ENCOUNTER — Ambulatory Visit (HOSPITAL_BASED_OUTPATIENT_CLINIC_OR_DEPARTMENT_OTHER)
Admission: RE | Admit: 2011-10-29 | Discharge: 2011-10-29 | Disposition: A | Discharge status: Home / Self Care | Payer: Medicare Other | Source: Ambulatory Visit | Attending: Urology | Admitting: Urology

## 2011-10-29 DIAGNOSIS — E119 Type 2 diabetes mellitus without complications: Secondary | ICD-10-CM | POA: Insufficient documentation

## 2011-10-29 DIAGNOSIS — Z79899 Other long term (current) drug therapy: Secondary | ICD-10-CM | POA: Insufficient documentation

## 2011-10-29 DIAGNOSIS — I10 Essential (primary) hypertension: Secondary | ICD-10-CM | POA: Insufficient documentation

## 2011-10-29 DIAGNOSIS — C679 Malignant neoplasm of bladder, unspecified: Secondary | ICD-10-CM | POA: Insufficient documentation

## 2011-10-29 DIAGNOSIS — S88119A Complete traumatic amputation at level between knee and ankle, unspecified lower leg, initial encounter: Secondary | ICD-10-CM | POA: Insufficient documentation

## 2011-10-29 HISTORY — DX: Peripheral vascular disease, unspecified: I73.9

## 2011-10-29 HISTORY — DX: Acquired absence of unspecified leg below knee: Z89.519

## 2011-10-29 HISTORY — PX: TRANSURETHRAL RESECTION OF BLADDER TUMOR: SHX2575

## 2011-10-29 HISTORY — DX: Personal history of urinary calculi: Z87.442

## 2011-10-29 HISTORY — DX: Malignant neoplasm of bladder, unspecified: C67.9

## 2011-10-29 HISTORY — PX: CYSTOSCOPY W/ RETROGRADES: SHX1426

## 2011-10-29 HISTORY — DX: Unspecified glaucoma: H40.9

## 2011-10-29 LAB — POCT I-STAT 4, (NA,K, GLUC, HGB,HCT)
Glucose, Bld: 227 mg/dL — ABNORMAL HIGH (ref 70–99)
Hemoglobin: 11.9 g/dL — ABNORMAL LOW (ref 13.0–17.0)
Potassium: 4.1 mEq/L (ref 3.5–5.1)

## 2011-10-29 SURGERY — CYSTOSCOPY, WITH RETROGRADE PYELOGRAM
Anesthesia: General

## 2011-10-29 MED ORDER — IOHEXOL 350 MG/ML SOLN
INTRAVENOUS | Status: DC | PRN
  Administered 2011-10-29: 50 mL via INTRAVENOUS

## 2011-10-29 MED ORDER — FENTANYL CITRATE 0.05 MG/ML IJ SOLN: 25.0000 ug | INTRAMUSCULAR | Status: DC | PRN

## 2011-10-29 MED ORDER — EPHEDRINE SULFATE 50 MG/ML IJ SOLN
INTRAMUSCULAR | Status: DC | PRN
  Administered 2011-10-29: 15 mg via INTRAVENOUS

## 2011-10-29 MED ORDER — PROPOFOL 10 MG/ML IV EMUL
INTRAVENOUS | Status: DC | PRN
  Administered 2011-10-29: 180 mg via INTRAVENOUS
  Administered 2011-10-29 (×2): 10 mg via INTRAVENOUS

## 2011-10-29 MED ORDER — ONDANSETRON HCL 4 MG/2ML IJ SOLN
INTRAMUSCULAR | Status: DC | PRN
  Administered 2011-10-29: 4 mg via INTRAVENOUS

## 2011-10-29 MED ORDER — FENTANYL CITRATE 0.05 MG/ML IJ SOLN
INTRAMUSCULAR | Status: DC | PRN
  Administered 2011-10-29: 25 ug via INTRAVENOUS
  Administered 2011-10-29: 50 ug via INTRAVENOUS
  Administered 2011-10-29: 25 ug via INTRAVENOUS

## 2011-10-29 MED ORDER — CEFAZOLIN SODIUM 1-5 GM-% IV SOLN: 1.0000 g | INTRAVENOUS | Status: DC

## 2011-10-29 MED ORDER — STERILE WATER FOR IRRIGATION IR SOLN
Status: DC | PRN
  Administered 2011-10-29: 3000 mL

## 2011-10-29 MED ORDER — CEFAZOLIN SODIUM-DEXTROSE 2-3 GM-% IV SOLR
2.0000 g | INTRAVENOUS | Status: AC
  Administered 2011-10-29: 2 g via INTRAVENOUS

## 2011-10-29 MED ORDER — LIDOCAINE HCL (CARDIAC) 20 MG/ML IV SOLN
INTRAVENOUS | Status: DC | PRN
  Administered 2011-10-29: 75 mg via INTRAVENOUS

## 2011-10-29 MED ORDER — LACTATED RINGERS IV SOLN
INTRAVENOUS | Status: DC
  Administered 2011-10-29 (×3): via INTRAVENOUS

## 2011-10-29 MED ORDER — LACTATED RINGERS IV SOLN: INTRAVENOUS | Status: DC

## 2011-10-29 MED ORDER — BELLADONNA ALKALOIDS-OPIUM 16.2-60 MG RE SUPP
RECTAL | Status: DC | PRN
  Administered 2011-10-29: 1 via RECTAL

## 2011-10-29 MED ORDER — CEPHALEXIN 250 MG PO CAPS: 250.0000 mg | ORAL_CAPSULE | Freq: Four times a day (QID) | ORAL | Status: AC

## 2011-10-29 SURGICAL SUPPLY — 39 items
ADAPTER CATH URET PLST 4-6FR (CATHETERS) ×3 IMPLANT
BAG DRAIN URO-CYSTO SKYTR STRL (DRAIN) ×3 IMPLANT
BAG URINE DRAINAGE (UROLOGICAL SUPPLIES) IMPLANT
BAG URINE LEG 19OZ MD ST LTX (BAG) ×3 IMPLANT
CANISTER SUCT LVC 12 LTR MEDI- (MISCELLANEOUS) ×3 IMPLANT
CATH FOLEY 2WAY SLVR  5CC 20FR (CATHETERS) ×1
CATH FOLEY 2WAY SLVR  5CC 22FR (CATHETERS)
CATH FOLEY 2WAY SLVR 5CC 20FR (CATHETERS) ×2 IMPLANT
CATH FOLEY 2WAY SLVR 5CC 22FR (CATHETERS) IMPLANT
CATH INTERMIT  6FR 70CM (CATHETERS) ×3 IMPLANT
CATH URET 5FR 28IN CONE TIP (BALLOONS)
CATH URET 5FR 28IN OPEN ENDED (CATHETERS) IMPLANT
CATH URET 5FR 70CM CONE TIP (BALLOONS) IMPLANT
CLOTH BEACON ORANGE TIMEOUT ST (SAFETY) ×3 IMPLANT
DRAPE CAMERA CLOSED 9X96 (DRAPES) ×3 IMPLANT
ELECT BUTTON BIOP 24F 90D PLAS (MISCELLANEOUS) IMPLANT
ELECT LOOP HF 26F 30D .35MM (CUTTING LOOP) IMPLANT
ELECT NEEDLE 45D HF 24-28F 12D (CUTTING LOOP) IMPLANT
ELECT REM PT RETURN 9FT ADLT (ELECTROSURGICAL) ×3
ELECTRODE REM PT RTRN 9FT ADLT (ELECTROSURGICAL) ×2 IMPLANT
EVACUATOR MICROVAS BLADDER (UROLOGICAL SUPPLIES) IMPLANT
GLOVE BIO SURGEON STRL SZ8 (GLOVE) ×3 IMPLANT
GOWN PREVENTION PLUS LG XLONG (DISPOSABLE) ×3 IMPLANT
GOWN PREVENTION PLUS XLARGE (GOWN DISPOSABLE) ×3 IMPLANT
GOWN STRL NON-REIN LRG LVL3 (GOWN DISPOSABLE) ×3 IMPLANT
GOWN STRL REIN XL XLG (GOWN DISPOSABLE) ×3 IMPLANT
GOWN XL W/COTTON TOWEL STD (GOWNS) ×3 IMPLANT
GUIDEWIRE 0.038 PTFE COATED (WIRE) IMPLANT
GUIDEWIRE ANG ZIPWIRE 038X150 (WIRE) IMPLANT
GUIDEWIRE STR DUAL SENSOR (WIRE) ×3 IMPLANT
HOLDER FOLEY CATH W/STRAP (MISCELLANEOUS) IMPLANT
KIT ASPIRATION TUBING (SET/KITS/TRAYS/PACK) IMPLANT
LOOP CUTTING 24FR OLYMPUS (CUTTING LOOP) IMPLANT
NS IRRIG 500ML POUR BTL (IV SOLUTION) IMPLANT
PACK CYSTOSCOPY (CUSTOM PROCEDURE TRAY) ×3 IMPLANT
PLUG CATH AND CAP STER (CATHETERS) IMPLANT
SET ASPIRATION TUBING (TUBING) IMPLANT
SYRINGE 10CC LL (SYRINGE) ×3 IMPLANT
SYRINGE IRR TOOMEY STRL 70CC (SYRINGE) IMPLANT

## 2011-10-29 NOTE — Anesthesia Preprocedure Evaluation (Addendum)
Anesthesia Evaluation  Patient identified by MRN, date of birth, ID band Patient awake    Reviewed: Allergy & Precautions, H&P , NPO status , Patient's Chart, lab work & pertinent test results, reviewed documented beta blocker date and time   Airway Mallampati: II TM Distance: >3 FB Neck ROM: full    Dental No notable dental hx. (+) Teeth Intact and Dental Advisory Given   Pulmonary neg pulmonary ROS,  breath sounds clear to auscultation  Pulmonary exam normal       Cardiovascular Exercise Tolerance: Good hypertension, Pt. on medications negative cardio ROS  Rhythm:regular Rate:Normal     Neuro/Psych negative neurological ROS  negative psych ROS   GI/Hepatic negative GI ROS, Neg liver ROS,   Endo/Other  negative endocrine ROSWell Controlled, Type 2, Oral Hypoglycemic Agents  Renal/GU negative Renal ROS  negative genitourinary   Musculoskeletal   Abdominal   Peds  Hematology negative hematology ROS (+)   Anesthesia Other Findings   Reproductive/Obstetrics negative OB ROS                          Anesthesia Physical Anesthesia Plan  ASA: III  Anesthesia Plan: General   Post-op Pain Management:    Induction: Intravenous  Airway Management Planned: LMA  Additional Equipment:   Intra-op Plan:   Post-operative Plan:   Informed Consent: I have reviewed the patients History and Physical, chart, labs and discussed the procedure including the risks, benefits and alternatives for the proposed anesthesia with the patient or authorized representative who has indicated his/her understanding and acceptance.   Dental Advisory Given  Plan Discussed with: CRNA and Surgeon  Anesthesia Plan Comments:         Anesthesia Quick Evaluation

## 2011-10-29 NOTE — Op Note (Signed)
Preoperative diagnosis:recurrent urothelial carcinoma of the bladder Postoperative diagnosis:same  Procedure:cystoscopy, left retrograde ureteral pyelogram, attempted right retrograde ureteral pyelogram, bladder biopsy/TURBT   Surgeon: Bertram Millard. Cartier Mapel, M.D.  Anesthesia: Gen.  Specimen: Bladder biopsies to pathology Indications:71 year old male with recurrent urothelial carcinoma the bladder. He presents at this time for repeat biopsy/TURBT, as well as bilateral retrograde ureteropyelograms to rule out upper tract pathology.     Technique and findings:the patient was properly identified in the holding area and received preoperative IV antibiotics. He was taken to the operating room where general anesthetic was administered using the LMA. He was placed in the dorsal lithotomy position. Genitalia and perineum were prepped and draped. Timeout was then performed.  The procedure then commenced. A 22 French panendoscope was advanced under direct vision through his urethra. The prostate was nonobstructive and there were no urethral lesions. The bladder was entered and inspected circumferentially. There were multiple scarred areas from prior biopsies. The left renal orifice was quite small and easily identified. The right ureteral orifice was not easily identified. There were scattered areas around the bladder with small micropapillary configurations which were fairly flat. These were approximately 5-6 mm in size each, and there were approximately 4of these, most on the right bladder wall and anterior bladder.no significanty large bladder lesions were seen. All these lesions were taken off with the cold cup biopsy forceps, and sent as "bladder biopsies". This essentially excised all the abnormal urothelium seen. I then cauterized the base of the biopsied areas with the Bugbee electrode. I attempted a right retrograde ureteral pyelogram, but the ureteral orifice was not seen due to some small urothelial  abnormality in the right trigonal region. After multiple attempts at cannulating, I then cauterized this area. The left ureteral orifice was cannulated for retrograde which was then performed.  The retrograde revealed a left ureter which was totally normal, without filling defects. The left renal pelvis and calyceal system was normal.  Following this, and after inspection of the bladder and seeing no bleeding, 20 French Foley catheter was placed with 10 cc of water placed in the balloon. This was then hooked to dependent drainage. The patient was awakened and taken to the PACU in stable condition.
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
## 2011-10-29 NOTE — Transfer of Care (Signed)
Immediate Anesthesia Transfer of Care Note  Patient: Hector Sloan  Procedure(s) Performed: Procedure(s) (LRB): CYSTOSCOPY WITH RETROGRADE PYELOGRAM (Bilateral) TRANSURETHRAL RESECTION OF BLADDER TUMOR (TURBT) (N/A)  Patient Location: Patient transported to PACU with oxygen via face mask at 4 Liters / Min  Anesthesia Type: General  Level of Consciousness: awake and alert   Airway & Oxygen Therapy: Patient Spontanous Breathing and Patient connected to face mask oxygen  Post-op Assessment: Report given to PACU RN and Post -op Vital signs reviewed and stable  Post vital signs: Reviewed and stable  Dentition: Teeth and oropharynx remain in pre-op condition  Complications: No apparent anesthesia complications

## 2011-10-29 NOTE — Anesthesia Postprocedure Evaluation (Addendum)
  Anesthesia Post-op Note  Patient: Hector Sloan  Procedure(s) Performed: Procedure(s) (LRB): CYSTOSCOPY WITH RETROGRADE PYELOGRAM (Bilateral) TRANSURETHRAL RESECTION OF BLADDER TUMOR (TURBT) (N/A)  Patient Location: PACU  Anesthesia Type: General  Level of Consciousness: awake and alert   Airway and Oxygen Therapy: Patient Spontanous Breathing  Post-op Pain: mild  Post-op Assessment: Post-op Vital signs reviewed, Patient's Cardiovascular Status Stable, Respiratory Function Stable, Patent Airway and No signs of Nausea or vomiting  Post-op Vital Signs: stable  Complications: No apparent anesthesia complications

## 2011-10-29 NOTE — Anesthesia Procedure Notes (Signed)
Procedure Name: LMA Insertion Date/Time: 10/29/2011 2:19 PM Performed by: Fran Lowes Pre-anesthesia Checklist: Patient identified, Emergency Drugs available, Suction available and Patient being monitored Patient Re-evaluated:Patient Re-evaluated prior to inductionOxygen Delivery Method: Circle System Utilized Preoxygenation: Pre-oxygenation with 100% oxygen Intubation Type: IV induction Ventilation: Mask ventilation without difficulty LMA: LMA inserted LMA Size: 4.0 Number of attempts: 1 Airway Equipment and Method: bite block Placement Confirmation: positive ETCO2 Tube secured with: Tape Dental Injury: Teeth and Oropharynx as per pre-operative assessment

## 2011-10-30 ENCOUNTER — Encounter (HOSPITAL_BASED_OUTPATIENT_CLINIC_OR_DEPARTMENT_OTHER): Payer: Self-pay | Admitting: Urology

## 2011-10-30 LAB — GLUCOSE, CAPILLARY: Glucose-Capillary: 181 mg/dL — ABNORMAL HIGH (ref 70–99)

## 2012-03-14 ENCOUNTER — Other Ambulatory Visit: Payer: Self-pay | Admitting: Urology

## 2012-04-01 ENCOUNTER — Encounter (HOSPITAL_BASED_OUTPATIENT_CLINIC_OR_DEPARTMENT_OTHER): Payer: Self-pay | Admitting: *Deleted

## 2012-04-01 NOTE — Progress Notes (Signed)
Pt instructed npo p mn 1/5.  To [facility] 1/6 @ 0900.  Needs istat on arrival.  ekg in epic

## 2012-04-04 ENCOUNTER — Ambulatory Visit (HOSPITAL_BASED_OUTPATIENT_CLINIC_OR_DEPARTMENT_OTHER)
Admission: RE | Admit: 2012-04-04 | Discharge: 2012-04-04 | Disposition: A | Discharge status: Home / Self Care | Payer: Medicare Other | Source: Ambulatory Visit | Attending: Urology | Admitting: Urology

## 2012-04-04 ENCOUNTER — Encounter (HOSPITAL_BASED_OUTPATIENT_CLINIC_OR_DEPARTMENT_OTHER): Payer: Self-pay | Admitting: Anesthesiology

## 2012-04-04 ENCOUNTER — Encounter (HOSPITAL_BASED_OUTPATIENT_CLINIC_OR_DEPARTMENT_OTHER): Payer: Self-pay | Admitting: *Deleted

## 2012-04-04 ENCOUNTER — Ambulatory Visit (HOSPITAL_BASED_OUTPATIENT_CLINIC_OR_DEPARTMENT_OTHER): Payer: Medicare Other | Admitting: Anesthesiology

## 2012-04-04 ENCOUNTER — Encounter (HOSPITAL_BASED_OUTPATIENT_CLINIC_OR_DEPARTMENT_OTHER)
Admission: RE | Disposition: A | Discharge status: Home / Self Care | Payer: Self-pay | Source: Ambulatory Visit | Attending: Urology

## 2012-04-04 DIAGNOSIS — N289 Disorder of kidney and ureter, unspecified: Secondary | ICD-10-CM | POA: Insufficient documentation

## 2012-04-04 DIAGNOSIS — Z79899 Other long term (current) drug therapy: Secondary | ICD-10-CM | POA: Insufficient documentation

## 2012-04-04 DIAGNOSIS — I10 Essential (primary) hypertension: Secondary | ICD-10-CM | POA: Insufficient documentation

## 2012-04-04 DIAGNOSIS — C672 Malignant neoplasm of lateral wall of bladder: Secondary | ICD-10-CM | POA: Insufficient documentation

## 2012-04-04 DIAGNOSIS — S88119A Complete traumatic amputation at level between knee and ankle, unspecified lower leg, initial encounter: Secondary | ICD-10-CM | POA: Insufficient documentation

## 2012-04-04 DIAGNOSIS — H409 Unspecified glaucoma: Secondary | ICD-10-CM | POA: Insufficient documentation

## 2012-04-04 DIAGNOSIS — E119 Type 2 diabetes mellitus without complications: Secondary | ICD-10-CM | POA: Insufficient documentation

## 2012-04-04 DIAGNOSIS — I739 Peripheral vascular disease, unspecified: Secondary | ICD-10-CM | POA: Insufficient documentation

## 2012-04-04 DIAGNOSIS — Z7982 Long term (current) use of aspirin: Secondary | ICD-10-CM | POA: Insufficient documentation

## 2012-04-04 DIAGNOSIS — Z87891 Personal history of nicotine dependence: Secondary | ICD-10-CM | POA: Insufficient documentation

## 2012-04-04 DIAGNOSIS — C679 Malignant neoplasm of bladder, unspecified: Secondary | ICD-10-CM

## 2012-04-04 HISTORY — PX: TRANSURETHRAL RESECTION OF BLADDER TUMOR: SHX2575

## 2012-04-04 LAB — POCT I-STAT, CHEM 8
BUN: 25 mg/dL — ABNORMAL HIGH (ref 6–23)
Calcium, Ion: 1.28 mmol/L (ref 1.13–1.30)
Chloride: 106 mEq/L (ref 96–112)
Glucose, Bld: 247 mg/dL — ABNORMAL HIGH (ref 70–99)
TCO2: 24 mmol/L (ref 0–100)

## 2012-04-04 SURGERY — TURBT (TRANSURETHRAL RESECTION OF BLADDER TUMOR)
Anesthesia: General | Site: Bladder | Wound class: Clean Contaminated

## 2012-04-04 MED ORDER — ONDANSETRON HCL 4 MG/2ML IJ SOLN
4.0000 mg | Freq: Four times a day (QID) | INTRAMUSCULAR | Status: DC | PRN
  Filled 2012-04-04: qty 2

## 2012-04-04 MED ORDER — SODIUM CHLORIDE 0.9 % IJ SOLN
3.0000 mL | Freq: Two times a day (BID) | INTRAMUSCULAR | Status: DC
  Filled 2012-04-04: qty 3

## 2012-04-04 MED ORDER — OXYCODONE HCL 5 MG/5ML PO SOLN
5.0000 mg | Freq: Once | ORAL | Status: DC | PRN
  Filled 2012-04-04: qty 5

## 2012-04-04 MED ORDER — ACETAMINOPHEN 10 MG/ML IV SOLN
1000.0000 mg | Freq: Four times a day (QID) | INTRAVENOUS | Status: DC
  Filled 2012-04-04: qty 100

## 2012-04-04 MED ORDER — LACTATED RINGERS IV SOLN
INTRAVENOUS | Status: DC
  Administered 2012-04-04 (×2): via INTRAVENOUS
  Filled 2012-04-04: qty 1000

## 2012-04-04 MED ORDER — SODIUM CHLORIDE 0.9 % IJ SOLN
3.0000 mL | INTRAMUSCULAR | Status: DC | PRN
  Filled 2012-04-04: qty 3

## 2012-04-04 MED ORDER — PROMETHAZINE HCL 25 MG/ML IJ SOLN
6.2500 mg | INTRAMUSCULAR | Status: DC | PRN
  Filled 2012-04-04: qty 1

## 2012-04-04 MED ORDER — DEXAMETHASONE SODIUM PHOSPHATE 4 MG/ML IJ SOLN
INTRAMUSCULAR | Status: DC | PRN
  Administered 2012-04-04: 8 mg via INTRAVENOUS

## 2012-04-04 MED ORDER — ACETAMINOPHEN 10 MG/ML IV SOLN
1000.0000 mg | Freq: Once | INTRAVENOUS | Status: DC | PRN
  Filled 2012-04-04: qty 100

## 2012-04-04 MED ORDER — FENTANYL CITRATE 0.05 MG/ML IJ SOLN
INTRAMUSCULAR | Status: DC | PRN
  Administered 2012-04-04: 25 ug via INTRAVENOUS
  Administered 2012-04-04: 50 ug via INTRAVENOUS
  Administered 2012-04-04: 25 ug via INTRAVENOUS

## 2012-04-04 MED ORDER — CEFAZOLIN SODIUM 1-5 GM-% IV SOLN
1.0000 g | INTRAVENOUS | Status: DC
  Filled 2012-04-04: qty 50

## 2012-04-04 MED ORDER — PROPOFOL 10 MG/ML IV BOLUS
INTRAVENOUS | Status: DC | PRN
  Administered 2012-04-04: 180 mg via INTRAVENOUS

## 2012-04-04 MED ORDER — ACETAMINOPHEN 325 MG PO TABS
650.0000 mg | ORAL_TABLET | ORAL | Status: DC | PRN
  Filled 2012-04-04: qty 2

## 2012-04-04 MED ORDER — STERILE WATER FOR IRRIGATION IR SOLN
Status: DC | PRN
  Administered 2012-04-04: 3000 mL

## 2012-04-04 MED ORDER — EPHEDRINE SULFATE 50 MG/ML IJ SOLN
INTRAMUSCULAR | Status: DC | PRN
  Administered 2012-04-04 (×2): 10 mg via INTRAVENOUS

## 2012-04-04 MED ORDER — CEFAZOLIN SODIUM-DEXTROSE 2-3 GM-% IV SOLR
INTRAVENOUS | Status: DC | PRN
  Administered 2012-04-04: 2 g via INTRAVENOUS

## 2012-04-04 MED ORDER — KETOROLAC TROMETHAMINE 30 MG/ML IJ SOLN
INTRAMUSCULAR | Status: DC | PRN
  Administered 2012-04-04: 15 mg via INTRAVENOUS

## 2012-04-04 MED ORDER — HYDROMORPHONE HCL PF 1 MG/ML IJ SOLN
0.2500 mg | INTRAMUSCULAR | Status: DC | PRN
  Filled 2012-04-04: qty 1

## 2012-04-04 MED ORDER — LIDOCAINE HCL (CARDIAC) 20 MG/ML IV SOLN
INTRAVENOUS | Status: DC | PRN
  Administered 2012-04-04: 80 mg via INTRAVENOUS

## 2012-04-04 MED ORDER — MEPERIDINE HCL 25 MG/ML IJ SOLN
6.2500 mg | INTRAMUSCULAR | Status: DC | PRN
  Filled 2012-04-04: qty 1

## 2012-04-04 MED ORDER — OXYCODONE HCL 5 MG PO TABS
5.0000 mg | ORAL_TABLET | ORAL | Status: DC | PRN
  Filled 2012-04-04: qty 2

## 2012-04-04 MED ORDER — CEFAZOLIN SODIUM-DEXTROSE 2-3 GM-% IV SOLR
2.0000 g | INTRAVENOUS | Status: DC
  Filled 2012-04-04: qty 50

## 2012-04-04 MED ORDER — MITOMYCIN CHEMO FOR BLADDER INSTILLATION 40 MG
40.0000 mg | Freq: Once | INTRAVENOUS | Status: DC
  Administered 2012-04-04: 40 mg via INTRAVESICAL
  Filled 2012-04-04: qty 40

## 2012-04-04 MED ORDER — ACETAMINOPHEN 650 MG RE SUPP
650.0000 mg | RECTAL | Status: DC | PRN
  Filled 2012-04-04: qty 1

## 2012-04-04 MED ORDER — OXYCODONE HCL 5 MG PO TABS
5.0000 mg | ORAL_TABLET | Freq: Once | ORAL | Status: DC | PRN
  Filled 2012-04-04: qty 1

## 2012-04-04 MED ORDER — CEFAZOLIN SODIUM-DEXTROSE 2-3 GM-% IV SOLR: INTRAVENOUS | Status: DC | PRN

## 2012-04-04 MED ORDER — ONDANSETRON HCL 4 MG/2ML IJ SOLN
INTRAMUSCULAR | Status: DC | PRN
  Administered 2012-04-04: 4 mg via INTRAVENOUS

## 2012-04-04 MED ORDER — SODIUM CHLORIDE 0.9 % IV SOLN
250.0000 mL | INTRAVENOUS | Status: DC | PRN
  Filled 2012-04-04: qty 250

## 2012-04-04 SURGICAL SUPPLY — 28 items
BAG DRAIN URO-CYSTO SKYTR STRL (DRAIN) ×3 IMPLANT
BAG URINE DRAINAGE (UROLOGICAL SUPPLIES) ×3 IMPLANT
BAG URINE LEG 19OZ MD ST LTX (BAG) IMPLANT
CANISTER SUCT LVC 12 LTR MEDI- (MISCELLANEOUS) ×3 IMPLANT
CATH FOLEY 2WAY SLVR  5CC 20FR (CATHETERS) ×1
CATH FOLEY 2WAY SLVR 5CC 20FR (CATHETERS) ×2 IMPLANT
CLOTH BEACON ORANGE TIMEOUT ST (SAFETY) ×3 IMPLANT
DRAPE CAMERA CLOSED 9X96 (DRAPES) ×3 IMPLANT
ELECT LOOP HF 26F 30D .35MM (CUTTING LOOP) IMPLANT
ELECT REM PT RETURN 9FT ADLT (ELECTROSURGICAL) ×3
ELECTRODE REM PT RTRN 9FT ADLT (ELECTROSURGICAL) ×2 IMPLANT
GLOVE BIO SURGEON STRL SZ8 (GLOVE) ×3 IMPLANT
GLOVE ECLIPSE 7.0 STRL STRAW (GLOVE) ×3 IMPLANT
GLOVE INDICATOR 7.0 STRL GRN (GLOVE) ×3 IMPLANT
GOWN PREVENTION PLUS LG XLONG (DISPOSABLE) ×3 IMPLANT
GOWN STRL REIN XL XLG (GOWN DISPOSABLE) ×3 IMPLANT
GOWN XL W/COTTON TOWEL STD (GOWNS) ×3 IMPLANT
HOLDER FOLEY CATH W/STRAP (MISCELLANEOUS) IMPLANT
NDL SAFETY ECLIPSE 18X1.5 (NEEDLE) IMPLANT
NEEDLE HYPO 18GX1.5 SHARP (NEEDLE)
NEEDLE HYPO 22GX1.5 SAFETY (NEEDLE) IMPLANT
NS IRRIG 500ML POUR BTL (IV SOLUTION) IMPLANT
PACK CYSTOSCOPY (CUSTOM PROCEDURE TRAY) ×3 IMPLANT
PLUG CATH AND CAP STER (CATHETERS) ×3 IMPLANT
SET ASPIRATION TUBING (TUBING) IMPLANT
SYR 20CC LL (SYRINGE) IMPLANT
SYRINGE IRR TOOMEY STRL 70CC (SYRINGE) IMPLANT
WATER STERILE IRR 3000ML UROMA (IV SOLUTION) ×3 IMPLANT

## 2012-04-04 NOTE — H&P (Signed)
Urology History and Physical Exam  CC: Recurrent bladder cancer  HPI: 72 year old male presents for repeat TUR-BT for recurrent urothelial carcinoma of the bladder. He has had several resections as well as BCG Rx's.  PMH: Past Medical History  Diagnosis Date  . Hypertension   . Diabetes mellitus     oral agents  . 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 Hx of bladder cancer   . Bladder cancer RECURRENT UROTHELIAL CARCINOMA  . History of kidney stones   . Renal insufficiency   . Peripheral vascular disease S/P LEFT BKA 1998  . Glaucoma of both eyes     PSH: Past Surgical History  Procedure Date  . Transurethral resection of bladder tumor 12-03-2010 ;   07-16-2010;,  03-12-2010    BLADDER CANCER (2011 W/ INSTILLATION CHEMO IN BLADDER)  . Multiple eye surg's (most of them of left) LAST ONE 2009  (LEFT EYE)    INCLUDING VITRECTOMY'S / REPAIR'S  DETACHED RETINA/  GLAUCOMA SETON'S  . Below knee leg amputation 1998    RIGHT  . Cystoscopy with biopsy 06/03/2011    Procedure: CYSTOSCOPY WITH BIOPSY;  Surgeon: Marcine Matar, MD;  Location: [facility];  Service: Urology;  Laterality: N/A;  . I & d left foot 04-22-2006    SECOND DEGREE BURN  . Cataract extraction w/ intraocular lens  implant, bilateral   . Cystoscopy w/ retrogrades 10/29/2011    Procedure: CYSTOSCOPY WITH RETROGRADE PYELOGRAM;  Surgeon: Marcine Matar, MD;  Location: [facility];  Service: Urology;  Laterality: Bilateral;  30 MIN REQUESTED   . Transurethral resection of bladder tumor 10/29/2011    Procedure: TRANSURETHRAL RESECTION OF BLADDER TUMOR (TURBT);  Surgeon: Marcine Matar, MD;  Location: [facility];  Service: Urology;  Laterality: N/A;  . I&d right foot, not left     Allergies: No Known Allergies  Medications: Prescriptions prior to admission  Medication Sig Dispense Refill  . aspirin 81 MG tablet Take 81 mg by mouth as needed.       . bimatoprost (LUMIGAN) 0.03 % ophthalmic solution Place 1 drop into both eyes at bedtime.       . brimonidine-timolol (COMBIGAN) 0.2-0.5 % ophthalmic solution Place 1 drop into both eyes every 12 (twelve) hours.      . Cholecalciferol (VITAMIN D3) 1000 UNITS CAPS Take 1 capsule by mouth daily.      Marland Kitchen glyBURIDE-metformin (GLUCOVANCE) 5-500 MG per tablet Take 1 tablet by mouth 2 (two) times daily with a meal.      . olmesartan-hydrochlorothiazide (BENICAR HCT) 40-25 MG per tablet Take 1 tablet by mouth daily.      . pioglitazone (ACTOS) 15 MG tablet Take 15 mg by mouth daily.         Social History: History   Social History  . Marital Status: Single    Spouse Name: N/A    Number of Children: N/A  . Years of Education: N/A   Occupational History  . Not on file.   Social History Main Topics  . Smoking status: Former Smoker -- 1.0 packs/day for 5 years    Quit date: 05/29/1990  . Smokeless tobacco: Not on file  . Alcohol Use: No  . Drug Use: No  . Sexually Active:    Other Topics Concern  . Not on file   Social History Narrative  . No narrative on file    Family History: History reviewed. No pertinent  family history.  Review of Systems: Positive: N/A Negative:   A further 10 point review of systems was negative except what is listed in the HPI.  Physical Exam: @VITALS2 @ General: No acute distress.  Awake. Head:  Normocephalic.  Atraumatic. ENT:  EOMI.  Mucous membranes moist Neck:  Supple.  No lymphadenopathy. CV:  S1 present. S2 present. Regular rate. Pulmonary: Equal effort bilaterally.  Clear to auscultation bilaterally. Abdomen: Soft.  Non tender to palpation. Skin:  Normal turgor.  No visible rash. Extremity: No gross deformity of bilateral upper extremities.  Neurologic: Alert. Appropriate mood.  .  Studies:  No results found for this basename: HGB:2,WBC:2,PLT:2 in the last 72 hours  No results found for this basename:  NA:2,K:2,CL:2,CO2:2,BUN:2,CREATININE:2,CALCIUM:2,MAGNESIUM:2,GFRNONAA:2,GFRAA:2 in the last 72 hours   No results found for this basename: PT:2,INR:2,APTT:2 in the last 72 hours   No components found with this basename: ABG:2    Assessment:  Recurrent urothelial carcinoma of the bladder  Plan: Repeat TUR-BT

## 2012-04-04 NOTE — Anesthesia Preprocedure Evaluation (Addendum)
Anesthesia Evaluation  Patient identified by MRN, date of birth, ID band Patient awake    Reviewed: Allergy & Precautions, H&P , NPO status , Patient's Chart, lab work & pertinent test results, reviewed documented beta blocker date and time   Airway Mallampati: II TM Distance: >3 FB Neck ROM: full    Dental No notable dental hx. (+) Dental Advisory Given, Edentulous Upper and Partial Lower   Pulmonary neg pulmonary ROS, former smoker,  breath sounds clear to auscultation  Pulmonary exam normal       Cardiovascular Exercise Tolerance: Good hypertension, Pt. on medications + Peripheral Vascular Disease negative cardio ROS  Rhythm:regular Rate:Normal     Neuro/Psych negative neurological ROS  negative psych ROS   GI/Hepatic negative GI ROS, Neg liver ROS,   Endo/Other  negative endocrine ROSdiabetes, Poorly Controlled, Type 2, Oral Hypoglycemic Agents  Renal/GU Renal InsufficiencyRenal diseasenegative Renal ROS  negative genitourinary   Musculoskeletal negative musculoskeletal ROS (+)   Abdominal (+) - obese,   Peds  Hematology negative hematology ROS (+)   Anesthesia Other Findings   Reproductive/Obstetrics negative OB ROS                        Anesthesia Physical Anesthesia Plan  ASA: III  Anesthesia Plan: General   Post-op Pain Management:    Induction: Intravenous  Airway Management Planned: LMA  Additional Equipment:   Intra-op Plan:   Post-operative Plan: Extubation in OR  Informed Consent: I have reviewed the patients History and Physical, chart, labs and discussed the procedure including the risks, benefits and alternatives for the proposed anesthesia with the patient or authorized representative who has indicated his/her understanding and acceptance.   Dental advisory given  Plan Discussed with: CRNA  Anesthesia Plan Comments:         Anesthesia Quick Evaluation                                   Anesthesia Evaluation  Patient identified by MRN, date of birth, ID band Patient awake    Reviewed: Allergy & Precautions, H&P , NPO status , Patient's Chart, lab work & pertinent test results, reviewed documented beta blocker date and time   Airway Mallampati: II TM Distance: >3 FB Neck ROM: full    Dental No notable dental hx. (+) Teeth Intact and Dental Advisory Given   Pulmonary neg pulmonary ROS,  breath sounds clear to auscultation  Pulmonary exam normal       Cardiovascular Exercise Tolerance: Good hypertension, Pt. on medications negative cardio ROS  Rhythm:regular Rate:Normal     Neuro/Psych negative neurological ROS  negative psych ROS   GI/Hepatic negative GI ROS, Neg liver ROS,   Endo/Other  negative endocrine ROSWell Controlled, Type 2, Oral Hypoglycemic Agents  Renal/GU negative Renal ROS  negative genitourinary   Musculoskeletal   Abdominal   Peds  Hematology negative hematology ROS (+)   Anesthesia Other Findings   Reproductive/Obstetrics negative OB ROS                          Anesthesia Physical Anesthesia Plan  ASA: III  Anesthesia Plan: General   Post-op Pain Management:    Induction: Intravenous  Airway Management Planned: LMA  Additional Equipment:   Intra-op Plan:   Post-operative Plan:   Informed Consent: I have reviewed the patients History and Physical, chart, labs  and discussed the procedure including the risks, benefits and alternatives for the proposed anesthesia with the patient or authorized representative who has indicated his/her understanding and acceptance.   Dental Advisory Given  Plan Discussed with: CRNA and Surgeon  Anesthesia Plan Comments:         Anesthesia Quick Evaluation

## 2012-04-04 NOTE — Transfer of Care (Signed)
Immediate Anesthesia Transfer of Care Note  Patient: Hector Sloan  Procedure(s) Performed: Procedure(s) (LRB): TRANSURETHRAL RESECTION OF BLADDER TUMOR (TURBT) (N/A)  Patient Location: PACU  Anesthesia Type: General  Level of Consciousness: awake, sedated, patient cooperative and responds to stimulation  Airway & Oxygen Therapy: Patient Spontanous Breathing and Patient connected to face mask oxygen  Post-op Assessment: Report given to PACU RN, Post -op Vital signs reviewed and stable and Patient moving all extremities  Post vital signs: Reviewed and stable  Complications: No apparent anesthesia complications

## 2012-04-04 NOTE — Anesthesia Postprocedure Evaluation (Signed)
Anesthesia Post Note  Patient: Hector Sloan  Procedure(s) Performed: Procedure(s) (LRB): TRANSURETHRAL RESECTION OF BLADDER TUMOR (TURBT) (N/A)  Anesthesia type: General  Patient location: PACU  Post pain: Pain level controlled  Post assessment: Post-op Vital signs reviewed  Last Vitals: BP 175/90  Pulse 96  Temp 36.8 C (Oral)  Resp 14  Ht 6\' 1"  (1.854 m)  Wt 182 lb (82.555 kg)  BMI 24.01 kg/m2  SpO2 99%  Post vital signs: Reviewed  Level of consciousness: sedated  Complications: No apparent anesthesia complications

## 2012-04-04 NOTE — Anesthesia Procedure Notes (Signed)
Procedure Name: LMA Insertion Date/Time: 04/04/2012 10:26 AM Performed by: Jessica Priest Pre-anesthesia Checklist: Patient identified, Emergency Drugs available, Suction available and Patient being monitored Patient Re-evaluated:Patient Re-evaluated prior to inductionOxygen Delivery Method: Circle System Utilized Preoxygenation: Pre-oxygenation with 100% oxygen Intubation Type: IV induction Ventilation: Mask ventilation without difficulty LMA: LMA with gastric port inserted LMA Size: 5.0 Number of attempts: 1 Placement Confirmation: positive ETCO2 Tube secured with: Tape Dental Injury: Teeth and Oropharynx as per pre-operative assessment

## 2012-04-05 ENCOUNTER — Encounter (HOSPITAL_BASED_OUTPATIENT_CLINIC_OR_DEPARTMENT_OTHER): Payer: Self-pay | Admitting: Urology

## 2012-04-22 NOTE — Op Note (Signed)
Preoperative diagnosis:recurrent urothelial carcinoma of the bladder  Postoperative diagnosis:same   Procedure:TURBT 3 cm lesion  , placement of mitomycin  Surgeon: Bertram Millard. Aristea Posada, M.D.   Anesthesia: Gen.   Complications:none  Specimen(s):bladder tumor, to pathology  Drains: Foley catheter  Indications:this 72 year old male presents for repeat TURBT. He has a long history of superficial, low-grade urothelial carcinoma the bladder appeared on routine surveillance recently he was found to have a recurrence on his right bladder wall approximately 3 cm in size. He presents for repeat TURBT.    Technique and findings:the patient was properly identified in the holding area and was taken of the operating room following antibiotic administration. He received general anesthetic and was placed in the dorsal lithotomy position. Genitalia and perineum were prepped and draped. Timeout was then performed.  I then passed a 22 [place] cystoscope. Urethra was normal without lesions or obstruction. Prostate was nonobstructive. The bladder was entered and inspected circumferentially. Several scars were noted from prior resections. On the right lateral wall, there was a 3 cm x 1 cm Pap leary lesion. No other lesions were seen following circumferentialinspection of the bladder. Ureteral orifices were normal in configuration location. I then removed the cystoscope and placed a 27 French resectoscope sheath with the obturator. The cutting loop was placed with the resectoscope, and the 12 lens. I then resected the right lateral wall lesion down to the muscle layer. The entire lesion was resected, and then cauterized using the loop.there was no evidence of perforation grossly. Small tumor fragments were sent for pathology. The bladder was again inspected no further lesions were seen. And placed a 20 French Foley catheter, filled the balloon with 10 cc water, and drain the bladder. A plug was then placed.  The  patient was awakened and taken to the PACU in stable condition. 40 mg of mitomycin were then placed within the bladder and the PACU, and left indwelling for one hour.  The patient tolerated the procedure well and was sent home in stable condition.

## 2012-09-06 IMAGING — US US RENAL
1 series · 14 of 25 positions shown · non-contrast
Comparison: None.

CLINICAL DATA: Microhematuria, chronic kidney disease

RENAL/URINARY TRACT ULTRASOUND COMPLETE

[Series 1: us renal · 0.27mm/px · 14 of 29 slices shown]
[im 1/29]
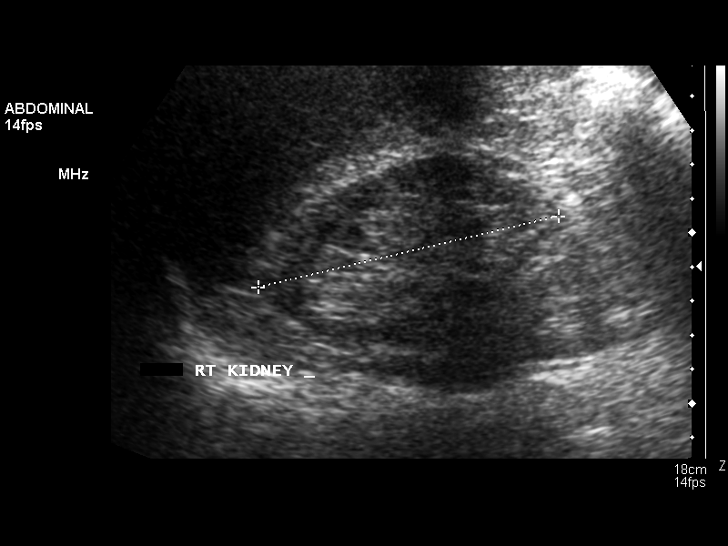
[im 3/29]
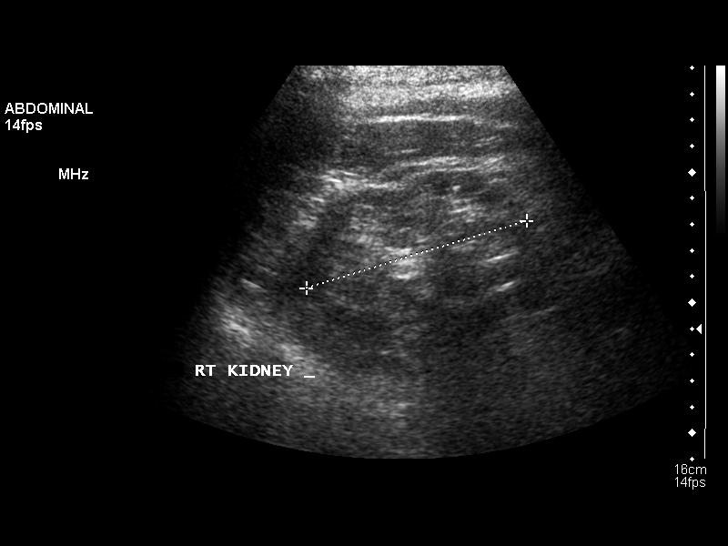
[im 5/29]
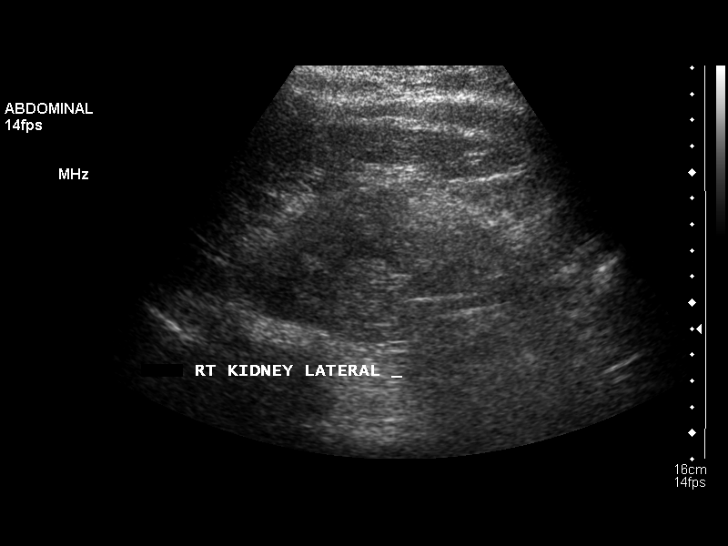
[im 8/29]
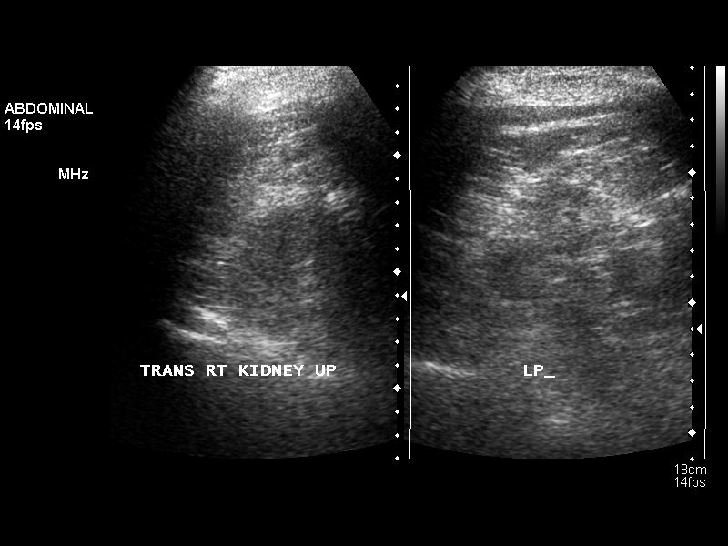
[im 10/29]
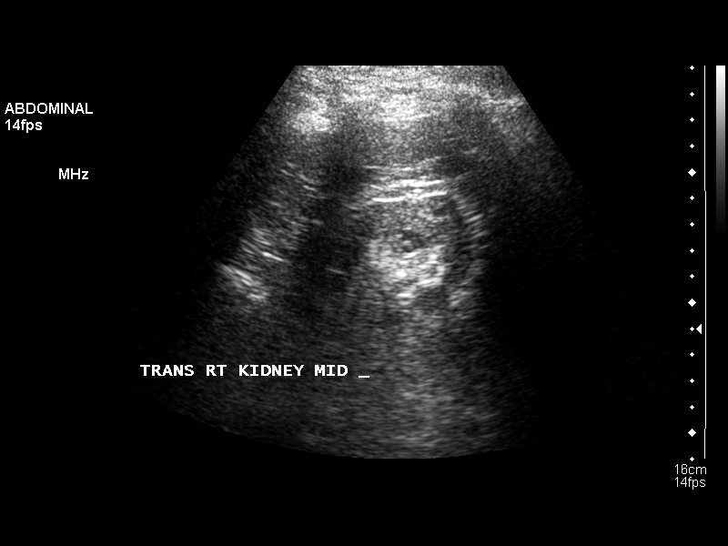
[im 11/29]
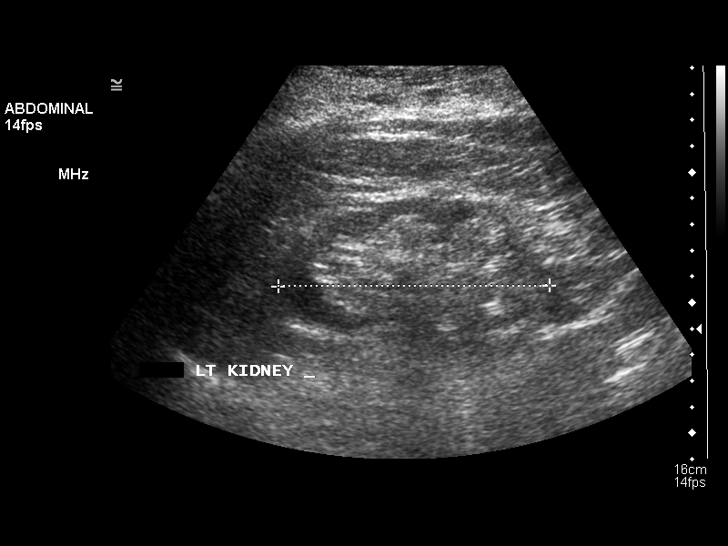
[im 13/29]
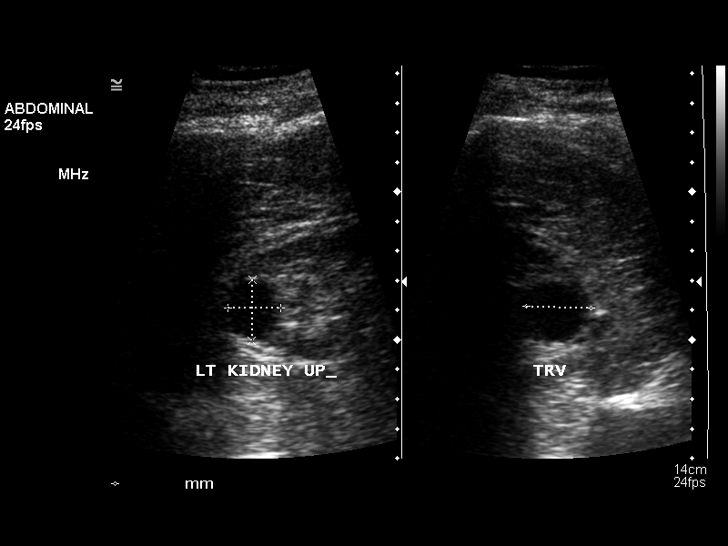
[im 16/29]
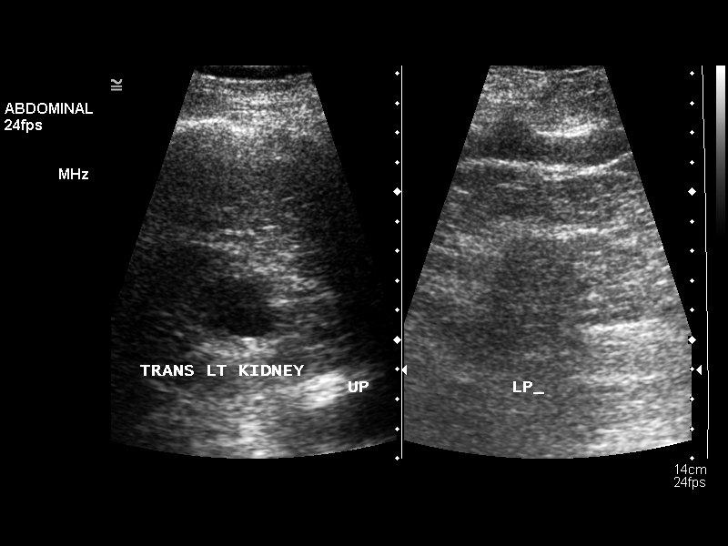
[im 18/29]
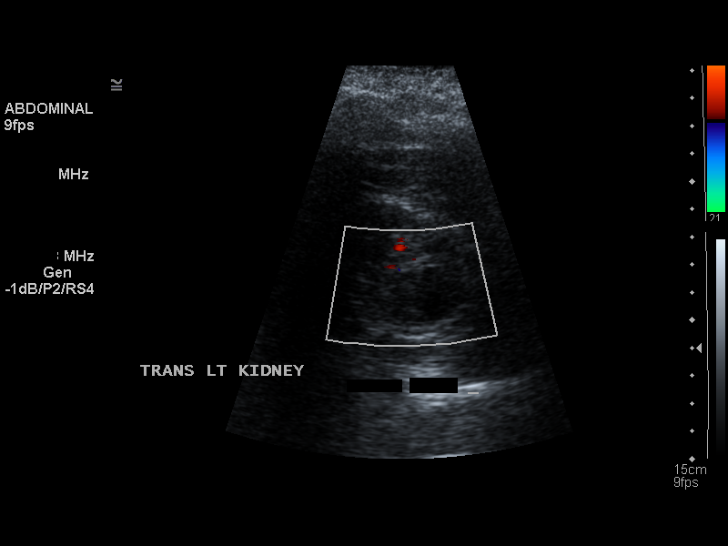
[im 19/29]
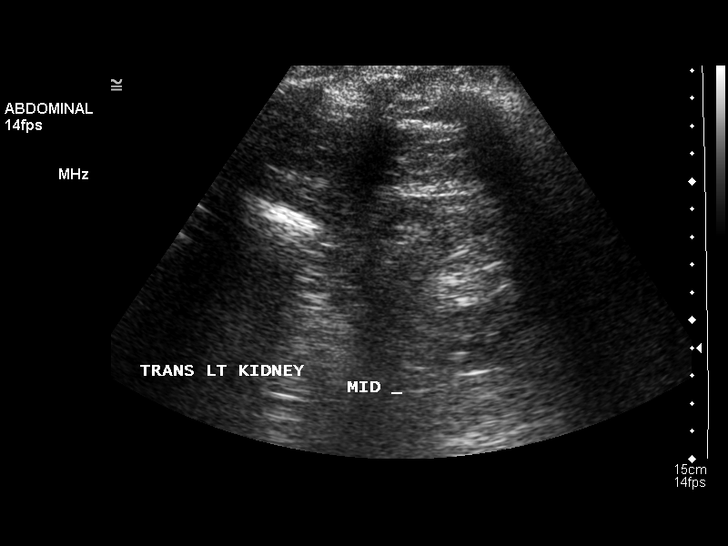
[im 22/29]
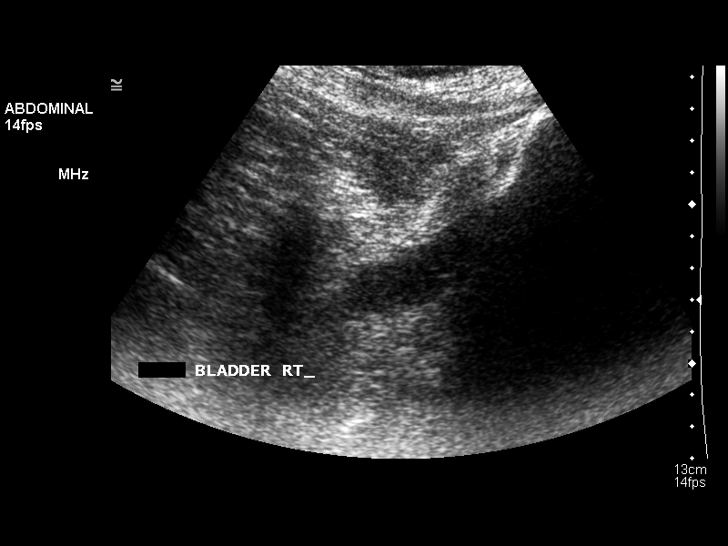
[im 24/29]
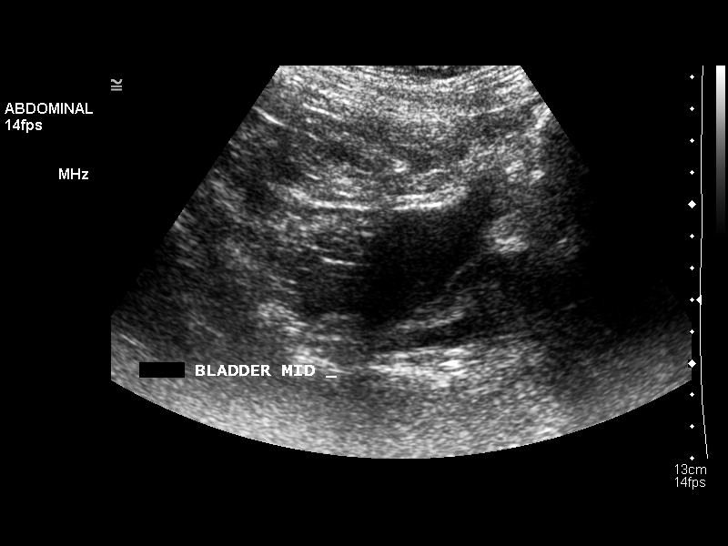
[im 26/29]
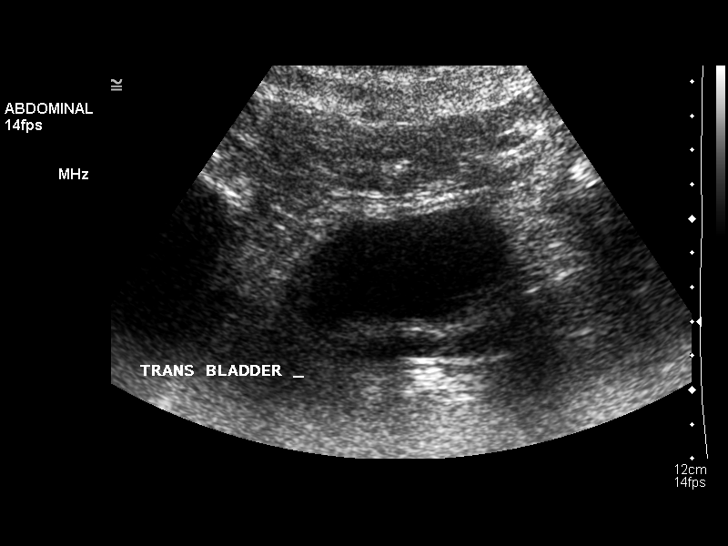
[im 29/29]
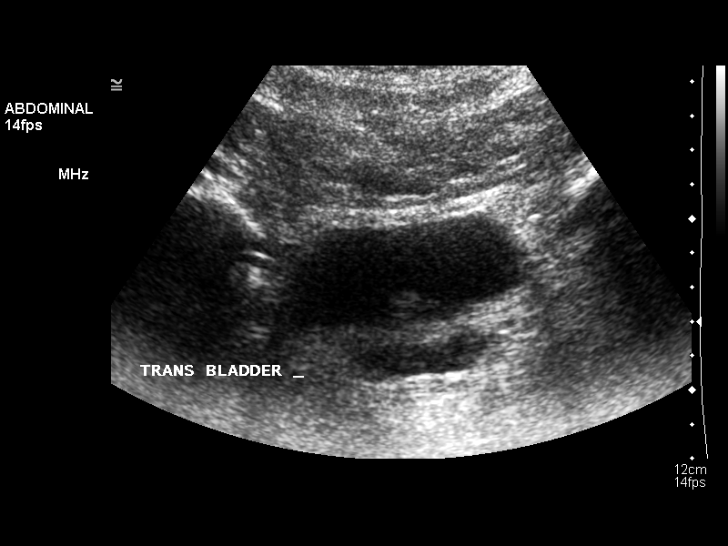

[14 of 25 positions shown; findings below may reference images not displayed]

FINDINGS: Right Kidney:  No hydronephrosis is seen.  The right kidney
measures 9.1 cm sagittally.  There is some renal cortical thinning
present.

Left Kidney:  No hydronephrosis.  The left kidney measures 10.4 cm.
There is a tiny cyst in the upper pole of 2.2 cm maximum diameter.
Some renal cortical thinning is noted.

Bladder:  The urinary bladder is not well distended but is
unremarkable for
IMPRESSION: It no hydronephrosis.  Some renal cortical thinning bilaterally.

## 2013-02-13 ENCOUNTER — Other Ambulatory Visit: Payer: Self-pay | Admitting: Urology

## 2013-02-27 ENCOUNTER — Encounter (HOSPITAL_BASED_OUTPATIENT_CLINIC_OR_DEPARTMENT_OTHER): Payer: Self-pay | Admitting: *Deleted

## 2013-02-28 ENCOUNTER — Encounter (HOSPITAL_BASED_OUTPATIENT_CLINIC_OR_DEPARTMENT_OTHER): Payer: Self-pay | Admitting: *Deleted

## 2013-02-28 NOTE — Progress Notes (Signed)
NPO AFTER MN.  ARRIVE AT 0715.  NEEDS ISTAT AND EKG.  

## 2013-03-01 ENCOUNTER — Encounter (HOSPITAL_BASED_OUTPATIENT_CLINIC_OR_DEPARTMENT_OTHER): Payer: Self-pay | Admitting: *Deleted

## 2013-03-01 NOTE — H&P (Signed)
Urology History and Physical Exam  CC: Bladder cancer  HPI: 72 year old male presents for repeat cystoscopy and bladder biopsy. His history is as follows:   This man comes in today for followup of urothelial carcinoma of the bladder. He originally presented in December of 2011 with multifocal bladder tumors. He underwent TURBT on 03/12/2010. Pathology revealed cautery artifact, but mostly there was a low-grade urothelial, papillary carcinoma present. There were focal areas of high-grade morphology. There was no evidence of invasive carcinoma. He underwent repeat TURBT on 07/16/2010 at that time, his tumors were cauterized as this all seems superficial. He underwent repeat cauterization of superficial tumors on 12/03/2010.  Because of his recurrent urothelial carcinoma, he completed induction BCG on January 30, 2011.  Followup cystoscopy in February, 2012 revealed multifocal areas of recurrence. He underwent repeat anesthetic cystoscopy, cauterization and TURBT on 06/03/2011.  He completed his second induction course of BCG on 08/21/2011.  On cystoscopy in late July, 2013 was found to have several areas of recurrence. He underwent repeat biopsy/cauterization on 10/29/2011. Left retrograde pyelogram was normal, right was unable to be completed to inability to cannulate the ureteral orifice.  His last maintenance BCG was in September, 2013. With those 3 of maintenance BCG as, he was given one third dose of the BCG +50,000,000 units of Intron.  He had cystoscopy in December, 2013. This revealed a recurrence on the right wall. He underwent resection on 04/04/2012. Pathology revealed high-grade urothelial carcinoma without invasion. This was a fairly small recurrence. Muscular tissue is not identified in the specimen.  He started BCG with Intron, 50 million units on 05/18/2012. He received 6 treatments, the last of which was 07/05/2012. He tolerated these well.  Cystoscopy on September 19, 2012 revealed a couple of  small recurrences.  These were treated in the office.Cystoscopy 01/20/2013 revealed a nodular growth on his bladder neck as well as a positive FISH.  PMH: Past Medical History  Diagnosis Date  . Hypertension   . S/P BKA (below knee amputation) IN 1998    LEFT/  HAS PROTHESIS  . History of kidney stones   . Peripheral vascular disease S/P LEFT BKA 1998  . Glaucoma of both eyes   . Type 2 diabetes mellitus   . Diabetic retinopathy   . Bladder cancer RECURRENT UROTHELIAL CARCINOMA    UROLOGIST--  DR Retta Diones---  s/p turbt's   and  bcg tx's  . CKD (chronic kidney disease), stage III     nephrologist--  dr Lowell Guitar    PSH: Past Surgical History  Procedure Laterality Date  . Transurethral resection of bladder tumor  12-03-2010 ;   07-16-2010;,  03-12-2010    BLADDER CANCER (2011 W/ INSTILLATION CHEMO IN BLADDER)  . Multiple eye surg's (most of them of left)  LAST ONE 2009  (LEFT EYE)    INCLUDING VITRECTOMY'S / REPAIR'S  DETACHED RETINA/  GLAUCOMA SETON'S  . Below knee leg amputation Left 1998  . Cystoscopy with biopsy  06/03/2011    Procedure: CYSTOSCOPY WITH BIOPSY;  Surgeon: Marcine Matar, MD;  Location: [facility];  Service: Urology;  Laterality: N/A;  . Cataract extraction w/ intraocular lens  implant, bilateral    . Cystoscopy w/ retrogrades  10/29/2011    Procedure: CYSTOSCOPY WITH RETROGRADE PYELOGRAM;  Surgeon: Marcine Matar, MD;  Location: [facility];  Service: Urology;  Laterality: Bilateral;  30 MIN REQUESTED   . Transurethral resection of bladder tumor  10/29/2011    Procedure: TRANSURETHRAL  RESECTION OF BLADDER TUMOR (TURBT);  Surgeon: Marcine Matar, MD;  Location: [facility];  Service: Urology;  Laterality: N/A;  . Transurethral resection of bladder tumor  04/04/2012    Procedure: TRANSURETHRAL RESECTION OF BLADDER TUMOR (TURBT);  Surgeon: Marcine Matar, MD;  Location: [facility];  Service: Urology;   Laterality: N/A;  COLD CUP BIOPSY   . I  &  d right foot  04-22-2006    SECOND DEGREE BURN    Allergies: No Known Allergies  Medications: No prescriptions prior to admission     Social History: History   Social History  . Marital Status: Single    Spouse Name: N/A    Number of Children: N/A  . Years of Education: N/A   Occupational History  . Not on file.   Social History Main Topics  . Smoking status: Former Smoker -- 1.00 packs/day for 5 years    Types: Cigarettes    Quit date: 05/29/1990  . Smokeless tobacco: Never Used  . Alcohol Use: No  . Drug Use: No  . Sexual Activity: Not on file   Other Topics Concern  . Not on file   Social History Narrative  . No narrative on file    Family History: History reviewed. No pertinent family history.  Review of Systems: Positive:N/A Negative:   A further 10 point review of systems was negative except what is listed in the HPI.  Physical Exam: @VITALS2 @ General: No acute distress.  Awake. Head:  Normocephalic.  Atraumatic. ENT:  EOMI.  Mucous membranes moist Neck:  Supple.  No lymphadenopathy. CV:  S1 present. S2 present. Regular rate. Pulmonary: Equal effort bilaterally.  Clear to auscultation bilaterally. Abdomen: Soft.  Non tender to palpation. Skin:  Normal turgor.  No visible rash. Extremity: No gross deformity of bilateral upper extremities.  No gross deformity of    bilateral lower extremities. Neurologic: Alert. Appropriate mood.   Studies:  No results found for this basename: HGB, WBC, PLT,  in the last 72 hours  No results found for this basename: NA, K, CL, CO2, BUN, CREATININE, CALCIUM, MAGNESIUM, GFRNONAA, GFRAA,  in the last 72 hours   No results found for this basename: PT, INR, APTT,  in the last 72 hours   No components found with this basename: ABG,     Assessment:  Recurrent bladder cancer  Plan: Cystoscopy, bilateral retrograde pyelograms, bladder biopsy

## 2013-03-02 ENCOUNTER — Encounter (HOSPITAL_BASED_OUTPATIENT_CLINIC_OR_DEPARTMENT_OTHER): Payer: Medicare Other | Admitting: Anesthesiology

## 2013-03-02 ENCOUNTER — Other Ambulatory Visit: Payer: Self-pay

## 2013-03-02 ENCOUNTER — Ambulatory Visit (HOSPITAL_BASED_OUTPATIENT_CLINIC_OR_DEPARTMENT_OTHER)
Admission: RE | Admit: 2013-03-02 | Discharge: 2013-03-02 | Disposition: A | Discharge status: Home / Self Care | Payer: Medicare Other | Source: Ambulatory Visit | Attending: Urology | Admitting: Urology

## 2013-03-02 ENCOUNTER — Encounter (HOSPITAL_BASED_OUTPATIENT_CLINIC_OR_DEPARTMENT_OTHER): Payer: Self-pay

## 2013-03-02 ENCOUNTER — Encounter (HOSPITAL_BASED_OUTPATIENT_CLINIC_OR_DEPARTMENT_OTHER)
Admission: RE | Disposition: A | Discharge status: Home / Self Care | Payer: Self-pay | Source: Ambulatory Visit | Attending: Urology

## 2013-03-02 ENCOUNTER — Ambulatory Visit (HOSPITAL_BASED_OUTPATIENT_CLINIC_OR_DEPARTMENT_OTHER): Payer: Medicare Other | Admitting: Anesthesiology

## 2013-03-02 DIAGNOSIS — C679 Malignant neoplasm of bladder, unspecified: Secondary | ICD-10-CM

## 2013-03-02 DIAGNOSIS — N183 Chronic kidney disease, stage 3 unspecified: Secondary | ICD-10-CM | POA: Insufficient documentation

## 2013-03-02 DIAGNOSIS — E11319 Type 2 diabetes mellitus with unspecified diabetic retinopathy without macular edema: Secondary | ICD-10-CM | POA: Insufficient documentation

## 2013-03-02 DIAGNOSIS — I129 Hypertensive chronic kidney disease with stage 1 through stage 4 chronic kidney disease, or unspecified chronic kidney disease: Secondary | ICD-10-CM | POA: Insufficient documentation

## 2013-03-02 DIAGNOSIS — C675 Malignant neoplasm of bladder neck: Secondary | ICD-10-CM | POA: Insufficient documentation

## 2013-03-02 DIAGNOSIS — Z87891 Personal history of nicotine dependence: Secondary | ICD-10-CM | POA: Insufficient documentation

## 2013-03-02 DIAGNOSIS — E1139 Type 2 diabetes mellitus with other diabetic ophthalmic complication: Secondary | ICD-10-CM | POA: Insufficient documentation

## 2013-03-02 HISTORY — PX: CYSTOSCOPY W/ RETROGRADES: SHX1426

## 2013-03-02 HISTORY — DX: Chronic kidney disease, stage 3 unspecified: N18.30

## 2013-03-02 HISTORY — DX: Type 2 diabetes mellitus with unspecified diabetic retinopathy without macular edema: E11.319

## 2013-03-02 HISTORY — DX: Chronic kidney disease, stage 3 (moderate): N18.3

## 2013-03-02 HISTORY — DX: Type 2 diabetes mellitus without complications: E11.9

## 2013-03-02 LAB — POCT I-STAT 4, (NA,K, GLUC, HGB,HCT)
HCT: 31 % — ABNORMAL LOW (ref 39.0–52.0)
Hemoglobin: 10.5 g/dL — ABNORMAL LOW (ref 13.0–17.0)
Potassium: 4 mEq/L (ref 3.5–5.1)

## 2013-03-02 LAB — GLUCOSE, CAPILLARY: Glucose-Capillary: 111 mg/dL — ABNORMAL HIGH (ref 70–99)

## 2013-03-02 SURGERY — CYSTOSCOPY, WITH RETROGRADE PYELOGRAM
Anesthesia: General | Site: Bladder | Laterality: Bilateral

## 2013-03-02 MED ORDER — CEPHALEXIN 500 MG PO CAPS: 500.0000 mg | ORAL_CAPSULE | Freq: Two times a day (BID) | ORAL | Status: DC

## 2013-03-02 MED ORDER — LACTATED RINGERS IV SOLN
INTRAVENOUS | Status: DC | PRN
  Administered 2013-03-02: 08:00:00 via INTRAVENOUS

## 2013-03-02 MED ORDER — CEFAZOLIN SODIUM 1-5 GM-% IV SOLN
1.0000 g | INTRAVENOUS | Status: DC
  Filled 2013-03-02: qty 50

## 2013-03-02 MED ORDER — STERILE WATER FOR IRRIGATION IR SOLN
Status: DC | PRN
  Administered 2013-03-02: 3000 mL

## 2013-03-02 MED ORDER — IOHEXOL 350 MG/ML SOLN
INTRAVENOUS | Status: DC | PRN
  Administered 2013-03-02: 7 mL

## 2013-03-02 MED ORDER — LIDOCAINE HCL (CARDIAC) 20 MG/ML IV SOLN
INTRAVENOUS | Status: DC | PRN
  Administered 2013-03-02: 80 mg via INTRAVENOUS

## 2013-03-02 MED ORDER — ONDANSETRON HCL 4 MG/2ML IJ SOLN
INTRAMUSCULAR | Status: DC | PRN
  Administered 2013-03-02: 4 mg via INTRAVENOUS

## 2013-03-02 MED ORDER — FENTANYL CITRATE 0.05 MG/ML IJ SOLN
25.0000 ug | INTRAMUSCULAR | Status: DC | PRN
  Filled 2013-03-02: qty 1

## 2013-03-02 MED ORDER — CEFAZOLIN SODIUM-DEXTROSE 2-3 GM-% IV SOLR
2.0000 g | INTRAVENOUS | Status: DC
  Filled 2013-03-02: qty 50

## 2013-03-02 MED ORDER — CEFAZOLIN SODIUM-DEXTROSE 2-3 GM-% IV SOLR
INTRAVENOUS | Status: DC | PRN
  Administered 2013-03-02: 2 g via INTRAVENOUS

## 2013-03-02 MED ORDER — MITOMYCIN CHEMO FOR BLADDER INSTILLATION 40 MG
40.0000 mg | Freq: Once | INTRAVENOUS | Status: AC
  Administered 2013-03-02: 40 mg via INTRAVESICAL
  Filled 2013-03-02: qty 40

## 2013-03-02 MED ORDER — PROPOFOL 10 MG/ML IV BOLUS
INTRAVENOUS | Status: DC | PRN
  Administered 2013-03-02: 150 mg via INTRAVENOUS

## 2013-03-02 MED ORDER — LACTATED RINGERS IV SOLN
INTRAVENOUS | Status: DC
  Administered 2013-03-02: 08:00:00 via INTRAVENOUS
  Filled 2013-03-02: qty 1000

## 2013-03-02 MED ORDER — FENTANYL CITRATE 0.05 MG/ML IJ SOLN
INTRAMUSCULAR | Status: DC | PRN
  Administered 2013-03-02 (×6): 25 ug via INTRAVENOUS

## 2013-03-02 MED ORDER — FENTANYL CITRATE 0.05 MG/ML IJ SOLN
INTRAMUSCULAR | Status: AC
  Filled 2013-03-02: qty 4

## 2013-03-02 MED ORDER — BELLADONNA ALKALOIDS-OPIUM 16.2-60 MG RE SUPP
RECTAL | Status: AC
  Filled 2013-03-02: qty 1

## 2013-03-02 MED ORDER — KETOROLAC TROMETHAMINE 30 MG/ML IJ SOLN
INTRAMUSCULAR | Status: DC | PRN
  Administered 2013-03-02: 30 mg via INTRAVENOUS

## 2013-03-02 MED ORDER — HYDROCODONE-ACETAMINOPHEN 5-325 MG PO TABS: 1.0000 | ORAL_TABLET | ORAL | Status: DC | PRN

## 2013-03-02 MED ORDER — LACTATED RINGERS IV SOLN
INTRAVENOUS | Status: DC
  Filled 2013-03-02: qty 1000

## 2013-03-02 MED ORDER — BELLADONNA ALKALOIDS-OPIUM 16.2-60 MG RE SUPP
RECTAL | Status: DC | PRN
  Administered 2013-03-02: 1 via RECTAL

## 2013-03-02 SURGICAL SUPPLY — 24 items
ADAPTER CATH URET PLST 4-6FR (CATHETERS) IMPLANT
BAG DRAIN URO-CYSTO SKYTR STRL (DRAIN) ×2 IMPLANT
BARD ×2 IMPLANT
CANISTER SUCT LVC 12 LTR MEDI- (MISCELLANEOUS) ×2 IMPLANT
CATH INTERMIT  6FR 70CM (CATHETERS) ×2 IMPLANT
CATH URET 5FR 28IN CONE TIP (BALLOONS)
CATH URET 5FR 28IN OPEN ENDED (CATHETERS) IMPLANT
CATH URET 5FR 70CM CONE TIP (BALLOONS) IMPLANT
CLOTH BEACON ORANGE TIMEOUT ST (SAFETY) ×2 IMPLANT
DRAPE CAMERA CLOSED 9X96 (DRAPES) ×2 IMPLANT
GLOVE BIO SURGEON STRL SZ 6 (GLOVE) ×2 IMPLANT
GLOVE BIO SURGEON STRL SZ 6.5 (GLOVE) ×4 IMPLANT
GLOVE BIO SURGEON STRL SZ8 (GLOVE) ×2 IMPLANT
GOWN PREVENTION PLUS LG XLONG (DISPOSABLE) ×2 IMPLANT
GOWN STRL REIN XL XLG (GOWN DISPOSABLE) ×2 IMPLANT
GUIDEWIRE 0.038 PTFE COATED (WIRE) IMPLANT
GUIDEWIRE ANG ZIPWIRE 038X150 (WIRE) IMPLANT
GUIDEWIRE STR DUAL SENSOR (WIRE) ×2 IMPLANT
LOOP CUTTING 24FR OLYMPUS (CUTTING LOOP) ×2 IMPLANT
NS IRRIG 500ML POUR BTL (IV SOLUTION) IMPLANT
PACK CYSTOSCOPY (CUSTOM PROCEDURE TRAY) ×2 IMPLANT
SYRINGE IRR TOOMEY STRL 70CC (SYRINGE) ×2 IMPLANT
WATER STERILE IRR 3000ML UROMA (IV SOLUTION) ×4 IMPLANT
WATER STERILE IRR 500ML POUR (IV SOLUTION) ×2 IMPLANT

## 2013-03-02 NOTE — Transfer of Care (Signed)
Immediate Anesthesia Transfer of Care Note  Patient: Hector Sloan  Procedure(s) Performed: Procedure(s) (LRB): CYSTOSCOPY WITH RETROGRADE PYELOGRAM AND BLADDER BIOPSY (Bilateral)  Patient Location: PACU  Anesthesia Type: General  Level of Consciousness: awake, sedated, patient cooperative and responds to stimulation  Airway & Oxygen Therapy: Patient Spontanous Breathing and Patient connected to face mask oxygen  Post-op Assessment: Report given to PACU RN, Post -op Vital signs reviewed and stable and Patient moving all extremities  Post vital signs: Reviewed and stable  Complications: No apparent anesthesia complications

## 2013-03-02 NOTE — Interval H&P Note (Signed)
History and Physical Interval Note:  03/02/2013 8:21 AM  Hector Sloan  has presented today for surgery, with the diagnosis of BLADDER CANCER  The various methods of treatment have been discussed with the patient and family. After consideration of risks, benefits and other options for treatment, the patient has consented to  Procedure(s): CYSTOSCOPY WITH RETROGRADE PYELOGRAM AND BLADDER BIOPSY (N/A) as a surgical intervention .  The patient's history has been reviewed, patient examined, no change in status, stable for surgery.  I have reviewed the patient's chart and labs.  Questions were answered to the patient's satisfaction.     Chelsea Aus

## 2013-03-02 NOTE — Anesthesia Postprocedure Evaluation (Signed)
  Anesthesia Post-op Note  Patient: Hector Sloan  Procedure(s) Performed: Procedure(s) (LRB): CYSTOSCOPY WITH RETROGRADE PYELOGRAM AND BLADDER BIOPSY (Bilateral)  Patient Location: PACU  Anesthesia Type: General  Level of Consciousness: awake and alert   Airway and Oxygen Therapy: Patient Spontanous Breathing  Post-op Pain: mild  Post-op Assessment: Post-op Vital signs reviewed, Patient's Cardiovascular Status Stable, Respiratory Function Stable, Patent Airway and No signs of Nausea or vomiting  Last Vitals:  Filed Vitals:   03/02/13 0915  BP: 146/77  Pulse: 93  Temp:   Resp: 9    Post-op Vital Signs: stable   Complications: No apparent anesthesia complications

## 2013-03-02 NOTE — Anesthesia Procedure Notes (Signed)
Procedure Name: LMA Insertion Date/Time: 03/02/2013 8:29 AM Performed by: Jessica Priest Pre-anesthesia Checklist: Patient identified, Emergency Drugs available, Suction available and Patient being monitored Patient Re-evaluated:Patient Re-evaluated prior to inductionOxygen Delivery Method: Circle System Utilized Preoxygenation: Pre-oxygenation with 100% oxygen Intubation Type: IV induction Ventilation: Mask ventilation without difficulty LMA: LMA inserted LMA Size: 5.0 Number of attempts: 1 Airway Equipment and Method: bite block Placement Confirmation: positive ETCO2 Tube secured with: Tape Dental Injury: Teeth and Oropharynx as per pre-operative assessment

## 2013-03-02 NOTE — Op Note (Signed)
Preoperative diagnosis: History of bladder cancer with recent positive cytologies  Postoperative diagnosis: Same   Procedure: Cystoscopy, bilateral retrograde ureteropyelograms, interpretive fluoroscopy, TUR of bladder neck/biopsies, installation of mitomycin    Surgeon: Bertram Millard. Vanity Larsson, M.D.   Anesthesia: Gen.   Complications: None  Specimen(s):Bladder neck biopsies  Drain(s): 20 French Foley catheter  Indications: 72 year old male with recurrent urothelial carcinoma the bladder. Please see his H&P for his prior history. Because of recent positive cytologies on followup cystoscopy, as well as some nodular growth at his bladder neck, he presents at this time for cystoscopy, retrogrades, bladder biopsy and mitomycin instillation.    Technique and findings: The patient was properly identified in the holding area. He received preoperative Ancef intravenously. He was taken to the operating room where general anesthetic was administered with the LMA. He was placed in the dorsolithotomy position. Genitalia and perineum were prepped and draped. Proper timeout was performed.  I then passed a 22 [place] panendoscope under direct vision through his urethra which was free of lesions or strictures. As there was minimal bladder neck contracture. Bladder was entered and inspected circumferentially with both the 12 and 70 lenses. There were multiple areas of scarring consistent with prior bladder biopsy/TUR. I did not see any urothelial lesions in the bladder. The bladder neck, especially on the right side just at the junction of the prostate, there was minimal nodularity and some irregular urothelium. There was also some and is located at the 12:00 position. I felt that it would be best to resect this with the cutting loop, rather than just performed cold cup biopsies.  At this point, using the 12 lens, I performed bilateral retrograde ureteropyelograms using a 6 [place] open-ended catheter.  On the  right, the ureter was normal throughout. There was no evidence of filling defect or hydroureter. The pyelocalyceal system was normal, free of any pyelocaliectasis or filling defects.  The left side looked the same-no evidence of filling defects or hydroureter. Pyelo-calyceal system was normal as well. I elected not to perform renal washings.  Following this, I placed a resectoscope sheath, and using the cutting loop of the resectoscope, resected the patient's bladder neck circumferentially. This encompassed all the tissue anteriorly and on the right bladder neck, as well as a left bladder neck. This was resected well into the prostatic urethra. There was a significant amount of bleeding, but care was taken to achieve immaculate hemostasis with the coag unit. Following this resection, down into the prostatic urethra, the chips were irrigated from the bladder. Inspection was made of the prostatic fossa/bladder neck area, with hemostasis seen. At this point, the scope was removed. I placed a 20 French Foley catheter and hooked this to a bedside bag.  The patient tolerated procedure well. He was taken to the PACU in stable condition. In the PACU, I placed 40 mg of mitomycin in 40 cc of diluent. This will be left indwelling for one hour.

## 2013-03-02 NOTE — Anesthesia Preprocedure Evaluation (Signed)
Anesthesia Evaluation  Patient identified by MRN, date of birth, ID band Patient awake    Reviewed: Allergy & Precautions, H&P , NPO status , Patient's Chart, lab work & pertinent test results  Airway Mallampati: II TM Distance: >3 FB Neck ROM: full    Dental  (+) Edentulous Upper, Dental Advisory Given and Missing Only has several front lower teeth left:   Pulmonary neg pulmonary ROS, former smoker,  breath sounds clear to auscultation  Pulmonary exam normal       Cardiovascular Exercise Tolerance: Good hypertension, Pt. on medications Rhythm:regular Rate:Normal     Neuro/Psych glaucoma negative neurological ROS  negative psych ROS   GI/Hepatic negative GI ROS, Neg liver ROS,   Endo/Other  diabetes, Well Controlled, Type 2, Oral Hypoglycemic Agents  Renal/GU negative Renal ROS  negative genitourinary   Musculoskeletal   Abdominal   Peds  Hematology negative hematology ROS (+) anemia , hgb 10.5   Anesthesia Other Findings   Reproductive/Obstetrics negative OB ROS                           Anesthesia Physical Anesthesia Plan  ASA: III  Anesthesia Plan: General   Post-op Pain Management:    Induction: Intravenous  Airway Management Planned: LMA  Additional Equipment:   Intra-op Plan:   Post-operative Plan:   Informed Consent: I have reviewed the patients History and Physical, chart, labs and discussed the procedure including the risks, benefits and alternatives for the proposed anesthesia with the patient or authorized representative who has indicated his/her understanding and acceptance.   Dental Advisory Given  Plan Discussed with: CRNA and Surgeon  Anesthesia Plan Comments:         Anesthesia Quick Evaluation

## 2013-03-03 ENCOUNTER — Encounter (HOSPITAL_BASED_OUTPATIENT_CLINIC_OR_DEPARTMENT_OTHER): Payer: Self-pay | Admitting: Urology

## 2013-11-14 ENCOUNTER — Other Ambulatory Visit: Payer: Self-pay | Admitting: Urology

## 2013-11-22 ENCOUNTER — Encounter (HOSPITAL_BASED_OUTPATIENT_CLINIC_OR_DEPARTMENT_OTHER): Payer: Self-pay | Admitting: *Deleted

## 2013-11-22 NOTE — Progress Notes (Signed)
NPO AFTER MN. ARRIVE AT 0600. NEEDS ISTAT.  CURRENT EKG IN CHART AND EPIC.

## 2013-11-22 NOTE — Progress Notes (Signed)
11/22/13 0938  OBSTRUCTIVE SLEEP APNEA  Have you ever been diagnosed with sleep apnea through a sleep study? No  Do you snore loudly (loud enough to be heard through closed doors)?  0  Do you often feel tired, fatigued, or sleepy during the daytime? 0  Has anyone observed you stop breathing during your sleep? 0  Do you have, or are you being treated for high blood pressure? 1  BMI more than 35 kg/m2? 0  Age over 75 years old? 1  Neck circumference greater than 40 cm/16 inches? 1  Gender: 1  Obstructive Sleep Apnea Score 4  Score 4 or greater  Results sent to PCP

## 2013-11-26 NOTE — Anesthesia Preprocedure Evaluation (Addendum)
Anesthesia Evaluation  Patient identified by MRN, date of birth, ID band Patient awake    Reviewed: Allergy & Precautions, H&P , NPO status , Patient's Chart, lab work & pertinent test results  Airway Mallampati: II TM Distance: >3 FB Neck ROM: Full    Dental no notable dental hx.    Pulmonary neg pulmonary ROS, former smoker,  breath sounds clear to auscultation  Pulmonary exam normal       Cardiovascular hypertension, Pt. on medications Rhythm:Regular Rate:Normal     Neuro/Psych negative neurological ROS  negative psych ROS   GI/Hepatic negative GI ROS, Neg liver ROS,   Endo/Other  diabetes, Type 2  Renal/GU Renal InsufficiencyRenal disease  negative genitourinary   Musculoskeletal negative musculoskeletal ROS (+)   Abdominal   Peds negative pediatric ROS (+)  Hematology negative hematology ROS (+)   Anesthesia Other Findings   Reproductive/Obstetrics negative OB ROS                          Anesthesia Physical Anesthesia Plan  ASA: III  Anesthesia Plan: General   Post-op Pain Management:    Induction: Intravenous  Airway Management Planned: LMA  Additional Equipment:   Intra-op Plan:   Post-operative Plan: Extubation in OR  Informed Consent: I have reviewed the patients History and Physical, chart, labs and discussed the procedure including the risks, benefits and alternatives for the proposed anesthesia with the patient or authorized representative who has indicated his/her understanding and acceptance.   Dental advisory given  Plan Discussed with: CRNA and Surgeon  Anesthesia Plan Comments:         Anesthesia Quick Evaluation

## 2013-11-27 ENCOUNTER — Encounter (HOSPITAL_BASED_OUTPATIENT_CLINIC_OR_DEPARTMENT_OTHER): Payer: Self-pay | Admitting: Anesthesiology

## 2013-11-27 ENCOUNTER — Encounter (HOSPITAL_BASED_OUTPATIENT_CLINIC_OR_DEPARTMENT_OTHER): Payer: Medicare Other | Admitting: Anesthesiology

## 2013-11-27 ENCOUNTER — Ambulatory Visit (HOSPITAL_BASED_OUTPATIENT_CLINIC_OR_DEPARTMENT_OTHER)
Admission: RE | Admit: 2013-11-27 | Discharge: 2013-11-27 | Disposition: A | Discharge status: Home / Self Care | Payer: Medicare Other | Source: Ambulatory Visit | Attending: Urology | Admitting: Urology

## 2013-11-27 ENCOUNTER — Ambulatory Visit (HOSPITAL_BASED_OUTPATIENT_CLINIC_OR_DEPARTMENT_OTHER): Payer: Medicare Other | Admitting: Anesthesiology

## 2013-11-27 ENCOUNTER — Encounter (HOSPITAL_BASED_OUTPATIENT_CLINIC_OR_DEPARTMENT_OTHER)
Admission: RE | Disposition: A | Discharge status: Home / Self Care | Payer: Self-pay | Source: Ambulatory Visit | Attending: Urology

## 2013-11-27 DIAGNOSIS — N183 Chronic kidney disease, stage 3 unspecified: Secondary | ICD-10-CM | POA: Diagnosis not present

## 2013-11-27 DIAGNOSIS — Z87891 Personal history of nicotine dependence: Secondary | ICD-10-CM | POA: Insufficient documentation

## 2013-11-27 DIAGNOSIS — I129 Hypertensive chronic kidney disease with stage 1 through stage 4 chronic kidney disease, or unspecified chronic kidney disease: Secondary | ICD-10-CM | POA: Insufficient documentation

## 2013-11-27 DIAGNOSIS — H409 Unspecified glaucoma: Secondary | ICD-10-CM | POA: Diagnosis not present

## 2013-11-27 DIAGNOSIS — Z7982 Long term (current) use of aspirin: Secondary | ICD-10-CM | POA: Insufficient documentation

## 2013-11-27 DIAGNOSIS — E11319 Type 2 diabetes mellitus with unspecified diabetic retinopathy without macular edema: Secondary | ICD-10-CM | POA: Insufficient documentation

## 2013-11-27 DIAGNOSIS — Z8551 Personal history of malignant neoplasm of bladder: Secondary | ICD-10-CM | POA: Insufficient documentation

## 2013-11-27 DIAGNOSIS — E1139 Type 2 diabetes mellitus with other diabetic ophthalmic complication: Secondary | ICD-10-CM | POA: Diagnosis not present

## 2013-11-27 DIAGNOSIS — S88119A Complete traumatic amputation at level between knee and ankle, unspecified lower leg, initial encounter: Secondary | ICD-10-CM | POA: Diagnosis not present

## 2013-11-27 DIAGNOSIS — Z79899 Other long term (current) drug therapy: Secondary | ICD-10-CM | POA: Insufficient documentation

## 2013-11-27 HISTORY — PX: CYSTOSCOPY W/ RETROGRADES: SHX1426

## 2013-11-27 HISTORY — DX: Other specified personal risk factors, not elsewhere classified: Z91.89

## 2013-11-27 HISTORY — PX: CYSTOSCOPY WITH BIOPSY: SHX5122

## 2013-11-27 LAB — POCT I-STAT 4, (NA,K, GLUC, HGB,HCT)
Glucose, Bld: 104 mg/dL — ABNORMAL HIGH (ref 70–99)
HEMATOCRIT: 31 % — AB (ref 39.0–52.0)
Hemoglobin: 10.5 g/dL — ABNORMAL LOW (ref 13.0–17.0)
Potassium: 5.2 mEq/L (ref 3.7–5.3)
SODIUM: 138 meq/L (ref 137–147)

## 2013-11-27 LAB — GLUCOSE, CAPILLARY: Glucose-Capillary: 111 mg/dL — ABNORMAL HIGH (ref 70–99)

## 2013-11-27 SURGERY — CYSTOSCOPY, WITH RETROGRADE PYELOGRAM
Anesthesia: General | Site: Ureter

## 2013-11-27 MED ORDER — ACETAMINOPHEN 325 MG PO TABS
650.0000 mg | ORAL_TABLET | ORAL | Status: DC | PRN
  Filled 2013-11-27: qty 2

## 2013-11-27 MED ORDER — BELLADONNA ALKALOIDS-OPIUM 16.2-60 MG RE SUPP
RECTAL | Status: AC
  Filled 2013-11-27: qty 1

## 2013-11-27 MED ORDER — IOHEXOL 350 MG/ML SOLN
INTRAVENOUS | Status: DC | PRN
  Administered 2013-11-27: 10 mL

## 2013-11-27 MED ORDER — EPHEDRINE SULFATE 50 MG/ML IJ SOLN
INTRAMUSCULAR | Status: DC | PRN
  Administered 2013-11-27 (×2): 10 mg via INTRAVENOUS

## 2013-11-27 MED ORDER — CEFAZOLIN SODIUM-DEXTROSE 2-3 GM-% IV SOLR
INTRAVENOUS | Status: AC
  Filled 2013-11-27: qty 50

## 2013-11-27 MED ORDER — DEXAMETHASONE SODIUM PHOSPHATE 4 MG/ML IJ SOLN
INTRAMUSCULAR | Status: DC | PRN
  Administered 2013-11-27: 4 mg via INTRAVENOUS

## 2013-11-27 MED ORDER — CEPHALEXIN 500 MG PO CAPS: 500.0000 mg | ORAL_CAPSULE | Freq: Two times a day (BID) | ORAL | Status: DC

## 2013-11-27 MED ORDER — OXYCODONE HCL 5 MG PO TABS
5.0000 mg | ORAL_TABLET | ORAL | Status: DC | PRN
  Filled 2013-11-27: qty 2

## 2013-11-27 MED ORDER — MIDAZOLAM HCL 2 MG/2ML IJ SOLN
INTRAMUSCULAR | Status: AC
  Filled 2013-11-27: qty 2

## 2013-11-27 MED ORDER — CEFAZOLIN SODIUM-DEXTROSE 2-3 GM-% IV SOLR
2.0000 g | INTRAVENOUS | Status: AC
  Administered 2013-11-27: 2 g via INTRAVENOUS
  Filled 2013-11-27: qty 50

## 2013-11-27 MED ORDER — SODIUM CHLORIDE 0.9 % IV SOLN
INTRAVENOUS | Status: DC
  Administered 2013-11-27: 07:00:00 via INTRAVENOUS
  Filled 2013-11-27: qty 1000

## 2013-11-27 MED ORDER — SODIUM CHLORIDE 0.9 % IJ SOLN
3.0000 mL | Freq: Two times a day (BID) | INTRAMUSCULAR | Status: DC
  Filled 2013-11-27: qty 3

## 2013-11-27 MED ORDER — LIDOCAINE HCL (CARDIAC) 20 MG/ML IV SOLN
INTRAVENOUS | Status: DC | PRN
  Administered 2013-11-27: 80 mg via INTRAVENOUS

## 2013-11-27 MED ORDER — ACETAMINOPHEN 10 MG/ML IV SOLN
INTRAVENOUS | Status: DC | PRN
  Administered 2013-11-27: 1000 mg via INTRAVENOUS

## 2013-11-27 MED ORDER — FENTANYL CITRATE 0.05 MG/ML IJ SOLN
INTRAMUSCULAR | Status: AC
  Filled 2013-11-27: qty 4

## 2013-11-27 MED ORDER — ACETAMINOPHEN 650 MG RE SUPP
650.0000 mg | RECTAL | Status: DC | PRN
  Filled 2013-11-27: qty 1

## 2013-11-27 MED ORDER — FENTANYL CITRATE 0.05 MG/ML IJ SOLN
25.0000 ug | INTRAMUSCULAR | Status: DC | PRN
  Filled 2013-11-27: qty 1

## 2013-11-27 MED ORDER — PROMETHAZINE HCL 25 MG/ML IJ SOLN
6.2500 mg | INTRAMUSCULAR | Status: DC | PRN
  Filled 2013-11-27: qty 1

## 2013-11-27 MED ORDER — CEFAZOLIN SODIUM 1-5 GM-% IV SOLN
1.0000 g | INTRAVENOUS | Status: DC
  Filled 2013-11-27: qty 50

## 2013-11-27 MED ORDER — SODIUM CHLORIDE 0.9 % IJ SOLN
3.0000 mL | INTRAMUSCULAR | Status: DC | PRN
  Filled 2013-11-27: qty 3

## 2013-11-27 MED ORDER — HYDROCODONE-ACETAMINOPHEN 5-325 MG PO TABS: 1.0000 | ORAL_TABLET | ORAL | Status: DC | PRN

## 2013-11-27 MED ORDER — MORPHINE SULFATE 2 MG/ML IJ SOLN
1.0000 mg | INTRAMUSCULAR | Status: DC | PRN
  Filled 2013-11-27: qty 1

## 2013-11-27 MED ORDER — SODIUM CHLORIDE 0.9 % IV SOLN
250.0000 mL | INTRAVENOUS | Status: DC | PRN
  Filled 2013-11-27: qty 250

## 2013-11-27 MED ORDER — FENTANYL CITRATE 0.05 MG/ML IJ SOLN
INTRAMUSCULAR | Status: DC | PRN
  Administered 2013-11-27: 50 ug via INTRAVENOUS
  Administered 2013-11-27 (×2): 25 ug via INTRAVENOUS

## 2013-11-27 MED ORDER — STERILE WATER FOR IRRIGATION IR SOLN
Status: DC | PRN
  Administered 2013-11-27: 3000 mL

## 2013-11-27 MED ORDER — PROPOFOL 10 MG/ML IV BOLUS
INTRAVENOUS | Status: DC | PRN
  Administered 2013-11-27: 200 mg via INTRAVENOUS

## 2013-11-27 MED ORDER — ONDANSETRON HCL 4 MG/2ML IJ SOLN
INTRAMUSCULAR | Status: DC | PRN
Start: 2013-11-27 — End: 2013-11-27
  Administered 2013-11-27: 4 mg via INTRAVENOUS

## 2013-11-27 SURGICAL SUPPLY — 33 items
ADAPTER CATH URET PLST 4-6FR (CATHETERS) IMPLANT
BAG DRAIN URO-CYSTO SKYTR STRL (DRAIN) ×4 IMPLANT
CANISTER SUCT LVC 12 LTR MEDI- (MISCELLANEOUS) ×4 IMPLANT
CATH INTERMIT  6FR 70CM (CATHETERS) IMPLANT
CATH URET 5FR 28IN CONE TIP (BALLOONS)
CATH URET 5FR 28IN OPEN ENDED (CATHETERS) IMPLANT
CATH URET 5FR 70CM CONE TIP (BALLOONS) IMPLANT
CLOTH BEACON ORANGE TIMEOUT ST (SAFETY) ×4 IMPLANT
CONT SPEC 4OZ CLIKSEAL STRL BL (MISCELLANEOUS) ×8 IMPLANT
DRAPE CAMERA CLOSED 9X96 (DRAPES) ×4 IMPLANT
ELECT REM PT RETURN 9FT ADLT (ELECTROSURGICAL) ×4
ELECTRODE REM PT RTRN 9FT ADLT (ELECTROSURGICAL) ×2 IMPLANT
GLOVE BIO SURGEON STRL SZ 6.5 (GLOVE) ×3 IMPLANT
GLOVE BIO SURGEON STRL SZ8 (GLOVE) ×4 IMPLANT
GLOVE BIO SURGEONS STRL SZ 6.5 (GLOVE) ×1
GLOVE BIOGEL PI IND STRL 6.5 (GLOVE) ×2 IMPLANT
GLOVE BIOGEL PI INDICATOR 6.5 (GLOVE) ×2
GOWN PREVENTION PLUS LG XLONG (DISPOSABLE) IMPLANT
GOWN STRL REIN XL XLG (GOWN DISPOSABLE) IMPLANT
GOWN STRL REUS W/TWL LRG LVL3 (GOWN DISPOSABLE) ×4 IMPLANT
GOWN STRL REUS W/TWL XL LVL3 (GOWN DISPOSABLE) ×4 IMPLANT
GUIDEWIRE 0.038 PTFE COATED (WIRE) IMPLANT
GUIDEWIRE ANG ZIPWIRE 038X150 (WIRE) IMPLANT
GUIDEWIRE STR DUAL SENSOR (WIRE) ×4 IMPLANT
IV NS IRRIG 3000ML ARTHROMATIC (IV SOLUTION) ×4 IMPLANT
NDL SAFETY ECLIPSE 18X1.5 (NEEDLE) IMPLANT
NEEDLE HYPO 18GX1.5 SHARP (NEEDLE)
NEEDLE HYPO 22GX1.5 SAFETY (NEEDLE) IMPLANT
NS IRRIG 500ML POUR BTL (IV SOLUTION) IMPLANT
PACK CYSTOSCOPY (CUSTOM PROCEDURE TRAY) ×4 IMPLANT
SYR 20CC LL (SYRINGE) IMPLANT
SYRINGE IRR TOOMEY STRL 70CC (SYRINGE) ×4 IMPLANT
WATER STERILE IRR 3000ML UROMA (IV SOLUTION) ×4 IMPLANT

## 2013-11-27 NOTE — Op Note (Signed)
PATIENT:  Hector Sloan  PRE-OPERATIVE DIAGNOSIS: History of recurrent bladder cancer with recent positive cytologies and cystoscopy  POST-OPERATIVE DIAGNOSIS: Same  PROCEDURE: Cystoscopy, bilateral retrograde ureteropyelogram, bilateral renal washings, interpretive fluoroscopy, biopsy of bladder, random and biopsy of bladder neck  SURGEON:  Lillette Boxer. Ginnifer Creelman, M.D.  ANESTHESIA:  General  EBL:  Minimal  DRAINS: None  LOCAL MEDICATIONS USED:  None  SPECIMEN:  1. Bladder barbotage for cytology 2. Right renal washings for cytology 3. Left renal washings for cytology 4. Bladder biopsies, anterior/random 5. Bladder neck biopsies  INDICATION: Hector Sloan is a 73 year old male with a long history of urothelial carcinoma the bladder. He presents at this time for cystoscopy, retrogrades and bladder biopsy. Recent followup cystoscopy was essentially negative but cytologies/FISH were positive.  Description of procedure: The patient was properly identified and marked (if applicable) in the holding area. They were then  taken to the operating room and placed on the table in a supine position. General anesthesia was then administered. Once fully anesthetized the patient was moved to the dorsolithotomy position and the genitalia and perineum were sterilely prepped and draped in standard fashion. An official timeout was then performed.  A 22 French panendoscope was advanced under direct vision through his urethra and into his bladder. The urethra was without stricture or urothelial lesions. Prostatic urethra was nonobstructive. The bladder was entered and inspected circumferentially. Careful inspection was carried out both the 12 and 70 lenses, including the bladder neck and prostatic urethra. Ureteral orifices were slightly laterally placed, and a bit hard to distinguish. They were of normal configuration, however. The bladder was somewhat pale with no evident trabeculations. There were no  papillary lesions. There were multiple stellate scars, more on the right than the left, indicative of prior biopsies. There were 2 small erythematous patches in the anterior bladder approximately 5 mm each. I saw no other significant urothelial abnormalities.  The right ureteral orifice was cannulated with a 6 [place] open-ended catheter. A retrograde ureteropyelogram was performed. This revealed a normal ureter throughout, without evident filling defects. There was no stricture or hydroureter. The renal pelvis and calyceal system appeared normal. There were no filling defects. I took still pictures of the retrograde.  After a thorough inspection radiographically of the ureter and pyelocalyceal system on the right, the open-ended catheter was advanced up in the renal pelvis using fluoroscopic guidance. I then used saline to obtain washings of the right renal pelvis and calyceal system. These were labeled "right renal pelvic washings" for cytology.  I then advanced the open-ended catheter into the left ureteral orifice. A retrograde ureteral pyelogram was again performed. This revealed a normal ureter and renal pelvis/calyceal system. The left calyceal system was somewhat more complex than the right. There were no filling defects, strictures or hydroureter of the left ureter. There were no filling defects in the left pyelo-calyceal system. There were some bubbles in the left upper ureter. Once I advanced the 6 [place] open-ended catheter up in this area, these were aspirated, and repeat study revealed no filling defects. I then guided the catheter up to the left renal pelvis, and using saline performed washings. These were sent as both left renal pelvic washings" for cytology.  At this point, cystoscopic biopsies of these 2 small lesions anterior in the bladder were performed with the cold cup biopsy forceps. His were labeled "bladder biopsies". Additionally, 2 biopsies were taken of the bladder neck, one at  the 5 and 1 at the 7:00 positions. The  Bugbee electrode was used to cauterize the biopsy sites. As they were fairly superficial biopsies, I did not feel, following cauterization, the Foley catheter was needed.  At this point, the biopsy sites were visualized and found to be hemostatic. The scope was then removed after the bladder was drained. The patient tolerated procedure well.     Plan of care: Discharge to home after PACU  PATIENT DISPOSITION:  PACU - hemodynamically stable.

## 2013-11-27 NOTE — Anesthesia Postprocedure Evaluation (Signed)
  Anesthesia Post-op Note  Patient: Hector Sloan  Procedure(s) Performed: Procedure(s) (LRB): CYSTOSCOPY WITH RETROGRADE PYELOGRAM (Bilateral) CYSTOSCOPY WITH BLADDER BIOPSIES, BLADDER AND RENAL WASHINGS (N/A)  Patient Location: PACU  Anesthesia Type: General  Level of Consciousness: awake and alert   Airway and Oxygen Therapy: Patient Spontanous Breathing  Post-op Pain: mild  Post-op Assessment: Post-op Vital signs reviewed, Patient's Cardiovascular Status Stable, Respiratory Function Stable, Patent Airway and No signs of Nausea or vomiting  Last Vitals:  Filed Vitals:   11/27/13 0817  BP: 144/62  Pulse: 102  Temp: 36.2 C  Resp: 14    Post-op Vital Signs: stable   Complications: No apparent anesthesia complications

## 2013-11-27 NOTE — Transfer of Care (Signed)
Immediate Anesthesia Transfer of Care Note  Patient: Hector Sloan  Procedure(s) Performed: Procedure(s) (LRB): CYSTOSCOPY WITH RETROGRADE PYELOGRAM (Bilateral) CYSTOSCOPY WITH BLADDER BIOPSIES, BLADDER AND RENAL WASHINGS (N/A)  Patient Location: PACU  Anesthesia Type: General  Level of Consciousness: awake, alert  and oriented  Airway & Oxygen Therapy: Patient Spontanous Breathing and Patient connected to face mask oxygen  Post-op Assessment: Report given to PACU RN and Post -op Vital signs reviewed and stable  Post vital signs: Reviewed and stable  Complications: No apparent anesthesia complications

## 2013-11-27 NOTE — H&P (Signed)
Urology History and Physical Exam  CC: Recurrent bladder cancer  HPI: 73 year old male presents for repeat cysto, bilateral retrograde pyelograms, bladder biopsy.   This man has a history of urothelial carcinoma of the bladder. He originally presented in December of 2011 with multifocal bladder tumors. He underwent TURBT on 03/12/2010. Pathology revealed cautery artifact, but mostly there was a low-grade urothelial, papillary carcinoma present. There were focal areas of high-grade morphology. There was no evidence of invasive carcinoma. He underwent repeat TURBT on 07/16/2010 at that time, his tumors were cauterized as this all seems superficial. He underwent repeat cauterization of superficial tumors on 12/03/2010.  Because of his recurrent urothelial carcinoma, he completed induction BCG on January 30, 2011.  Followup cystoscopy in February, 2012 revealed multifocal areas of recurrence. He underwent repeat anesthetic cystoscopy, cauterization and TURBT on 06/03/2011.  He completed his second induction course of BCG on 08/21/2011.  On cystoscopy in late July, 2013 was found to have several areas of recurrence. He underwent repeat biopsy/cauterization on 10/29/2011. Left retrograde pyelogram was normal, right was unable to be completed to inability to cannulate the ureteral orifice.  His last maintenance BCG was in September, 2013. With those 3 of maintenance BCG as, he was given one third dose of the BCG +50,000,000 units of Intron.  He had cystoscopy in December, 2013. This revealed a recurrence on the right wall. He underwent resection on 04/04/2012. Pathology revealed high-grade urothelial carcinoma without invasion. This was a fairly small recurrence. Muscular tissue is not identified in the specimen.  He started BCG with Intron, 50 million units on 05/18/2012. He received 6 treatments, the last of which was 07/05/2012. He tolerated these well.  Cystoscopy on September 19, 2012 revealed a couple of small  recurrences. These were treated in the office with in office electrocautery.  He last underwent TURBT on 03/02/2013. He had a bladder neck recurrence. This was high-grade, noninvasive. Muscle was present, not involved. He has been doing well since that time    PMH: Past Medical History  Diagnosis Date  . Hypertension   . S/P BKA (below knee amputation) IN 1998    LEFT/  HAS PROTHESIS  . History of kidney stones   . Peripheral vascular disease S/P LEFT BKA 1998  . Glaucoma of both eyes   . Type 2 diabetes mellitus   . Diabetic retinopathy   . Bladder cancer RECURRENT UROTHELIAL CARCINOMA    UROLOGIST--  DR Diona Fanti---  s/p turbt's   and  bcg tx's  . CKD (chronic kidney disease), stage III     nephrologist--  dr Florene Glen  . At risk for sleep apnea     STOP-BANG= 4    SENT TO PCP 11-22-2013    PSH: Past Surgical History  Procedure Laterality Date  . Transurethral resection of bladder tumor  12-03-2010 ;   07-16-2010;,  03-12-2010    BLADDER CANCER (2011 W/ INSTILLATION CHEMO IN BLADDER)  . Multiple eye surg's (most of them of left)  LAST ONE 2009  (LEFT EYE)    INCLUDING VITRECTOMY'S / REPAIR'S  DETACHED RETINA/  GLAUCOMA SETON'S  . Below knee leg amputation Left 1998  . Cystoscopy with biopsy  06/03/2011    Procedure: CYSTOSCOPY WITH BIOPSY;  Surgeon: Franchot Gallo, MD;  Location: [facility];  Service: Urology;  Laterality: N/A;  . Cataract extraction w/ intraocular lens  implant, bilateral    . Cystoscopy w/ retrogrades  10/29/2011    Procedure: CYSTOSCOPY WITH RETROGRADE PYELOGRAM;  Surgeon: Franchot Gallo, MD;  Location: [facility];  Service: Urology;  Laterality: Bilateral;  30 MIN REQUESTED   . Transurethral resection of bladder tumor  10/29/2011    Procedure: TRANSURETHRAL RESECTION OF BLADDER TUMOR (TURBT);  Surgeon: Franchot Gallo, MD;  Location: [facility];  Service: Urology;  Laterality: N/A;  . Transurethral resection  of bladder tumor  04/04/2012    Procedure: TRANSURETHRAL RESECTION OF BLADDER TUMOR (TURBT);  Surgeon: Franchot Gallo, MD;  Location: [facility];  Service: Urology;  Laterality: N/A;  COLD CUP BIOPSY   . I  &  d right foot  04-22-2006    SECOND DEGREE BURN  . Cystoscopy w/ retrogrades Bilateral 03/02/2013    Procedure: CYSTOSCOPY WITH RETROGRADE PYELOGRAM AND BLADDER BIOPSY;  Surgeon: Franchot Gallo, MD;  Location: [facility];  Service: Urology;  Laterality: Bilateral;    Allergies: No Known Allergies  Medications: Prescriptions prior to admission  Medication Sig Dispense Refill  . aspirin 81 MG tablet Take 81 mg by mouth as needed.      . bimatoprost (LUMIGAN) 0.03 % ophthalmic solution Place 1 drop into both eyes at bedtime.       . brimonidine-timolol (COMBIGAN) 0.2-0.5 % ophthalmic solution Place 1 drop into both eyes every 12 (twelve) hours.      Marland Kitchen glyBURIDE-metformin (GLUCOVANCE) 5-500 MG per tablet Take 1 tablet by mouth 2 (two) times daily with a meal.      . olmesartan-hydrochlorothiazide (BENICAR HCT) 40-25 MG per tablet Take 1 tablet by mouth daily.         Social History: History   Social History  . Marital Status: Single    Spouse Name: N/A    Number of Children: N/A  . Years of Education: N/A   Occupational History  . Not on file.   Social History Main Topics  . Smoking status: Former Smoker -- 1.00 packs/day for 5 years    Types: Cigarettes    Quit date: 05/29/1990  . Smokeless tobacco: Never Used  . Alcohol Use: No  . Drug Use: No  . Sexual Activity: Not on file   Other Topics Concern  . Not on file   Social History Narrative  . No narrative on file    Family History: History reviewed. No pertinent family history.  Review of Systems: Positive: N/A Negative:   A further 10 point review of systems was negative except what is listed in the HPI.                  Physical Exam: @VITALS2 @ General: No acute  distress.  Awake. Head:  Normocephalic.  Atraumatic. ENT:  EOMI.  Mucous membranes moist Neck:  Supple.  No lymphadenopathy. CV:  S1 present. S2 present. Regular rate. Pulmonary: Equal effort bilaterally.  Clear to auscultation bilaterally. Abdomen: Soft.  non tender to palpation. Skin:  Normal turgor.  No visible rash. Extremity: No gross deformity of bilateral upper extremities.  Left BKA Neurologic: Alert. Appropriate mood.    Studies:  No results found for this basename: HGB, WBC, PLT,  in the last 72 hours  No results found for this basename: NA, K, CL, CO2, BUN, CREATININE, CALCIUM, MAGNESIUM, GFRNONAA, GFRAA,  in the last 72 hours   No results found for this basename: PT, INR, APTT,  in the last 72 hours   No components found with this basename: ABG,     Assessment:    Urothelial carcinoma bladder, stage Ta, basically  low grade. He is status post TURBT 03/12/2010 and 07/16/2010 and most recently on 12/03/2010. He completed induction BCG on 01/30/2011.   He had a recurrence noted with a cystoscopy in February, 2013 and underwent repeat cystoscopy and bladder biopsy/TURBT on 06/03/2011. He completed a second round of induction BCG on 08/21/2011. He had a recurrence in July 2013, and had repeat resection in August, 2013. He completed a third round of BCG with Intron, 3 treatments only, in September, 2013.  He had a recurrence resected on 04/04/2012. This revealed high-grade disease now, but with no evidence of invasion .    Plan: Cysto, bilateral retrogrades, bladder biopsy, possible bilateral ureteroscopy

## 2013-11-27 NOTE — Discharge Instructions (Signed)
1. You may see some blood in the urine and may have some burning with urination for 48-72 hours. You also may notice that you have to urinate more frequently or urgently after your procedure which is normal.  2. You should call should you develop an inability urinate, fever > 101, persistent nausea and vomiting that prevents you from eating or drinking to stay hydrated.  3. If you have a stent, you will likely urinate more frequently and urgently until the stent is removed and you may experience some discomfort/pain in the lower abdomen and flank especially when urinating. You may take pain medication prescribed to you if needed for pain. You may also intermittently have blood in the urine until the stent is removed. If you have a catheter, you will be taught how to take care of the catheter by the nursing staff prior to discharge from the hospital.  You may periodically feel a strong urge to void with the catheter in place.  This is a bladder spasm and most often can occur when having a bowel movement or moving around. It is typically self-limited and usually will stop after a few minutes.  You may use some Vaseline or Neosporin around the tip of the catheter to reduce friction at the tip of the penis. You may also see some blood in the urine.  A very small amount of blood can make the urine look quite red.  As long as the catheter is draining well, there usually is not a problem.  However, if the catheter is not draining well and is bloody, you should call the office [phone]) to notify us. Post Anesthesia Home Care Instructions  Activity: Get plenty of rest for the remainder of the day. A responsible adult should stay with you for 24 hours following the procedure.  For the next 24 hours, DO NOT: -Drive a car -Paediatric nurse -Drink alcoholic beverages -Take any medication unless instructed by your physician -Make any legal decisions or sign important papers.  Meals: Start with liquid foods  such as gelatin or soup. Progress to regular foods as tolerated. Avoid greasy, spicy, heavy foods. If nausea and/or vomiting occur, drink only clear liquids until the nausea and/or vomiting subsides. Call your physician if vomiting continues.  Special Instructions/Symptoms: Your throat may feel dry or sore from the anesthesia or the breathing tube placed in your throat during surgery. If this causes discomfort, gargle with warm salt water. The discomfort should disappear within 24 hours. 4.

## 2013-11-27 NOTE — Anesthesia Procedure Notes (Signed)
Procedure Name: LMA Insertion Date/Time: 11/27/2013 7:37 AM Performed by: Mechele Claude Pre-anesthesia Checklist: Patient identified, Emergency Drugs available, Suction available and Patient being monitored Patient Re-evaluated:Patient Re-evaluated prior to inductionOxygen Delivery Method: Circle System Utilized Preoxygenation: Pre-oxygenation with 100% oxygen Intubation Type: IV induction Ventilation: Mask ventilation without difficulty LMA: LMA inserted LMA Size: 5.0 Number of attempts: 1 Airway Equipment and Method: bite block Placement Confirmation: positive ETCO2 Tube secured with: Tape Dental Injury: Teeth and Oropharynx as per pre-operative assessment

## 2013-11-28 ENCOUNTER — Encounter (HOSPITAL_BASED_OUTPATIENT_CLINIC_OR_DEPARTMENT_OTHER): Payer: Self-pay | Admitting: Urology

## 2015-04-16 ENCOUNTER — Encounter (HOSPITAL_COMMUNITY): Payer: Self-pay | Admitting: Emergency Medicine

## 2015-04-16 ENCOUNTER — Observation Stay (HOSPITAL_COMMUNITY)
Admission: EM | Admit: 2015-04-16 | Discharge: 2015-04-19 | Disposition: A | Discharge status: Home / Self Care | Payer: Medicare Other | Attending: Family Medicine | Admitting: Family Medicine

## 2015-04-16 ENCOUNTER — Emergency Department (HOSPITAL_COMMUNITY): Payer: Medicare Other

## 2015-04-16 DIAGNOSIS — Z87442 Personal history of urinary calculi: Secondary | ICD-10-CM | POA: Diagnosis not present

## 2015-04-16 DIAGNOSIS — Z7984 Long term (current) use of oral hypoglycemic drugs: Secondary | ICD-10-CM | POA: Insufficient documentation

## 2015-04-16 DIAGNOSIS — Z89512 Acquired absence of left leg below knee: Secondary | ICD-10-CM | POA: Insufficient documentation

## 2015-04-16 DIAGNOSIS — N183 Chronic kidney disease, stage 3 unspecified: Secondary | ICD-10-CM | POA: Diagnosis present

## 2015-04-16 DIAGNOSIS — E11319 Type 2 diabetes mellitus with unspecified diabetic retinopathy without macular edema: Secondary | ICD-10-CM | POA: Insufficient documentation

## 2015-04-16 DIAGNOSIS — R000 Tachycardia, unspecified: Secondary | ICD-10-CM | POA: Diagnosis not present

## 2015-04-16 DIAGNOSIS — W19XXXA Unspecified fall, initial encounter: Secondary | ICD-10-CM | POA: Diagnosis not present

## 2015-04-16 DIAGNOSIS — M25521 Pain in right elbow: Secondary | ICD-10-CM | POA: Diagnosis present

## 2015-04-16 DIAGNOSIS — E1151 Type 2 diabetes mellitus with diabetic peripheral angiopathy without gangrene: Secondary | ICD-10-CM | POA: Diagnosis not present

## 2015-04-16 DIAGNOSIS — E872 Acidosis, unspecified: Secondary | ICD-10-CM | POA: Diagnosis present

## 2015-04-16 DIAGNOSIS — E1165 Type 2 diabetes mellitus with hyperglycemia: Secondary | ICD-10-CM | POA: Insufficient documentation

## 2015-04-16 DIAGNOSIS — D649 Anemia, unspecified: Secondary | ICD-10-CM | POA: Diagnosis not present

## 2015-04-16 DIAGNOSIS — M10321 Gout due to renal impairment, right elbow: Secondary | ICD-10-CM

## 2015-04-16 DIAGNOSIS — Z79899 Other long term (current) drug therapy: Secondary | ICD-10-CM | POA: Insufficient documentation

## 2015-04-16 DIAGNOSIS — I129 Hypertensive chronic kidney disease with stage 1 through stage 4 chronic kidney disease, or unspecified chronic kidney disease: Secondary | ICD-10-CM | POA: Insufficient documentation

## 2015-04-16 DIAGNOSIS — M25561 Pain in right knee: Secondary | ICD-10-CM | POA: Diagnosis not present

## 2015-04-16 DIAGNOSIS — M25522 Pain in left elbow: Secondary | ICD-10-CM

## 2015-04-16 DIAGNOSIS — K219 Gastro-esophageal reflux disease without esophagitis: Secondary | ICD-10-CM | POA: Insufficient documentation

## 2015-04-16 DIAGNOSIS — I491 Atrial premature depolarization: Secondary | ICD-10-CM | POA: Diagnosis not present

## 2015-04-16 DIAGNOSIS — Z87891 Personal history of nicotine dependence: Secondary | ICD-10-CM | POA: Diagnosis not present

## 2015-04-16 DIAGNOSIS — Z8551 Personal history of malignant neoplasm of bladder: Secondary | ICD-10-CM | POA: Diagnosis not present

## 2015-04-16 DIAGNOSIS — N179 Acute kidney failure, unspecified: Secondary | ICD-10-CM | POA: Insufficient documentation

## 2015-04-16 DIAGNOSIS — R600 Localized edema: Secondary | ICD-10-CM | POA: Diagnosis not present

## 2015-04-16 DIAGNOSIS — M109 Gout, unspecified: Secondary | ICD-10-CM | POA: Diagnosis not present

## 2015-04-16 DIAGNOSIS — M10329 Gout due to renal impairment, unspecified elbow: Secondary | ICD-10-CM | POA: Diagnosis present

## 2015-04-16 DIAGNOSIS — M19022 Primary osteoarthritis, left elbow: Secondary | ICD-10-CM | POA: Diagnosis not present

## 2015-04-16 DIAGNOSIS — M25529 Pain in unspecified elbow: Secondary | ICD-10-CM | POA: Diagnosis present

## 2015-04-16 LAB — COMPREHENSIVE METABOLIC PANEL
ALBUMIN: 2.9 g/dL — AB (ref 3.5–5.0)
ALT: 21 U/L (ref 17–63)
AST: 35 U/L (ref 15–41)
Alkaline Phosphatase: 75 U/L (ref 38–126)
Anion gap: 12 (ref 5–15)
BUN: 53 mg/dL — AB (ref 6–20)
CALCIUM: 9 mg/dL (ref 8.9–10.3)
CO2: 21 mmol/L — AB (ref 22–32)
CREATININE: 2.08 mg/dL — AB (ref 0.61–1.24)
Chloride: 103 mmol/L (ref 101–111)
GFR calc Af Amer: 34 mL/min — ABNORMAL LOW (ref >60)
GFR calc non Af Amer: 29 mL/min — ABNORMAL LOW (ref >60)
GLUCOSE: 301 mg/dL — AB (ref 65–99)
Potassium: 3.8 mmol/L (ref 3.5–5.1)
SODIUM: 136 mmol/L (ref 135–145)
TOTAL PROTEIN: 7.8 g/dL (ref 6.5–8.1)
Total Bilirubin: 0.8 mg/dL (ref 0.3–1.2)

## 2015-04-16 LAB — URINE MICROSCOPIC-ADD ON

## 2015-04-16 LAB — URINALYSIS, ROUTINE W REFLEX MICROSCOPIC
Bilirubin Urine: NEGATIVE
GLUCOSE, UA: NEGATIVE mg/dL
HGB URINE DIPSTICK: NEGATIVE
KETONES UR: NEGATIVE mg/dL
Leukocytes, UA: NEGATIVE
Nitrite: NEGATIVE
PH: 5 (ref 5.0–8.0)
PROTEIN: 100 mg/dL — AB
Specific Gravity, Urine: 1.015 (ref 1.005–1.030)

## 2015-04-16 LAB — URIC ACID: Uric Acid, Serum: 8.9 mg/dL — ABNORMAL HIGH (ref 4.4–7.6)

## 2015-04-16 LAB — TROPONIN I

## 2015-04-16 LAB — CBC
HCT: 29.8 % — ABNORMAL LOW (ref 39.0–52.0)
Hemoglobin: 9.8 g/dL — ABNORMAL LOW (ref 13.0–17.0)
MCH: 27.3 pg (ref 26.0–34.0)
MCHC: 32.9 g/dL (ref 30.0–36.0)
MCV: 83 fL (ref 78.0–100.0)
PLATELETS: 357 10*3/uL (ref 150–400)
RBC: 3.59 MIL/uL — ABNORMAL LOW (ref 4.22–5.81)
RDW: 15.6 % — ABNORMAL HIGH (ref 11.5–15.5)
WBC: 13.4 10*3/uL — ABNORMAL HIGH (ref 4.0–10.5)

## 2015-04-16 LAB — I-STAT CG4 LACTIC ACID, ED
LACTIC ACID, VENOUS: 2.07 mmol/L — AB (ref 0.5–2.0)
LACTIC ACID, VENOUS: 1.31 mmol/L (ref 0.5–2.0)
Lactic Acid, Venous: 2.26 mmol/L (ref 0.5–2.0)

## 2015-04-16 LAB — SEDIMENTATION RATE: Sed Rate: 135 mm/hr — ABNORMAL HIGH (ref 0–16)

## 2015-04-16 LAB — PROCALCITONIN: PROCALCITONIN: 1.57 ng/mL

## 2015-04-16 LAB — CBG MONITORING, ED: Glucose-Capillary: 205 mg/dL — ABNORMAL HIGH (ref 65–99)

## 2015-04-16 LAB — TSH: TSH: 0.482 u[IU]/mL (ref 0.350–4.500)

## 2015-04-16 LAB — BRAIN NATRIURETIC PEPTIDE: B NATRIURETIC PEPTIDE 5: 128.9 pg/mL — AB (ref 0.0–100.0)

## 2015-04-16 MED ORDER — ONDANSETRON HCL 4 MG PO TABS: 4.0000 mg | ORAL_TABLET | Freq: Four times a day (QID) | ORAL | Status: DC | PRN

## 2015-04-16 MED ORDER — COLCHICINE 0.6 MG PO TABS
1.2000 mg | ORAL_TABLET | Freq: Once | ORAL | Status: AC
  Administered 2015-04-16: 1.2 mg via ORAL
  Filled 2015-04-16: qty 2

## 2015-04-16 MED ORDER — PANTOPRAZOLE SODIUM 40 MG PO TBEC
40.0000 mg | DELAYED_RELEASE_TABLET | Freq: Every day | ORAL | Status: DC
  Administered 2015-04-17 – 2015-04-19 (×4): 40 mg via ORAL
  Filled 2015-04-16 (×4): qty 1

## 2015-04-16 MED ORDER — BRIMONIDINE TARTRATE 0.2 % OP SOLN
1.0000 [drp] | Freq: Two times a day (BID) | OPHTHALMIC | Status: DC
  Administered 2015-04-17 – 2015-04-19 (×6): 1 [drp] via OPHTHALMIC
  Filled 2015-04-16: qty 5

## 2015-04-16 MED ORDER — SODIUM CHLORIDE 0.9 % IJ SOLN
3.0000 mL | Freq: Two times a day (BID) | INTRAMUSCULAR | Status: DC
  Administered 2015-04-17 – 2015-04-19 (×4): 3 mL via INTRAVENOUS

## 2015-04-16 MED ORDER — SODIUM CHLORIDE 0.9 % IV BOLUS (SEPSIS)
1000.0000 mL | Freq: Once | INTRAVENOUS | Status: AC
  Administered 2015-04-16: 1000 mL via INTRAVENOUS

## 2015-04-16 MED ORDER — ATORVASTATIN CALCIUM 10 MG PO TABS
20.0000 mg | ORAL_TABLET | Freq: Every day | ORAL | Status: DC
  Administered 2015-04-17 – 2015-04-19 (×3): 20 mg via ORAL
  Filled 2015-04-16 (×2): qty 2
  Filled 2015-04-16: qty 1

## 2015-04-16 MED ORDER — ACETAMINOPHEN 650 MG RE SUPP: 650.0000 mg | Freq: Four times a day (QID) | RECTAL | Status: DC | PRN

## 2015-04-16 MED ORDER — HEPARIN SODIUM (PORCINE) 5000 UNIT/ML IJ SOLN
5000.0000 [IU] | Freq: Three times a day (TID) | INTRAMUSCULAR | Status: DC
Start: 2015-04-16 — End: 2015-04-19
  Administered 2015-04-17 – 2015-04-19 (×8): 5000 [IU] via SUBCUTANEOUS
  Filled 2015-04-16 (×8): qty 1

## 2015-04-16 MED ORDER — TIMOLOL MALEATE 0.5 % OP SOLN
1.0000 [drp] | Freq: Two times a day (BID) | OPHTHALMIC | Status: DC
  Administered 2015-04-17 – 2015-04-19 (×6): 1 [drp] via OPHTHALMIC
  Filled 2015-04-16: qty 5

## 2015-04-16 MED ORDER — MORPHINE SULFATE (PF) 2 MG/ML IV SOLN
2.0000 mg | Freq: Once | INTRAVENOUS | Status: AC
  Administered 2015-04-16: 2 mg via INTRAVENOUS
  Filled 2015-04-16: qty 1

## 2015-04-16 MED ORDER — INSULIN ASPART 100 UNIT/ML ~~LOC~~ SOLN
0.0000 [IU] | Freq: Three times a day (TID) | SUBCUTANEOUS | Status: DC
  Administered 2015-04-17 (×3): 3 [IU] via SUBCUTANEOUS
  Administered 2015-04-18: 2 [IU] via SUBCUTANEOUS
  Administered 2015-04-18: 8 [IU] via SUBCUTANEOUS
  Administered 2015-04-19: 3 [IU] via SUBCUTANEOUS
  Administered 2015-04-19: 5 [IU] via SUBCUTANEOUS
  Filled 2015-04-16: qty 1

## 2015-04-16 MED ORDER — ACETAMINOPHEN 325 MG PO TABS: 650.0000 mg | ORAL_TABLET | Freq: Four times a day (QID) | ORAL | Status: DC | PRN

## 2015-04-16 MED ORDER — SODIUM CHLORIDE 0.9 % IV BOLUS (SEPSIS)
500.0000 mL | Freq: Once | INTRAVENOUS | Status: AC
  Administered 2015-04-16: 500 mL via INTRAVENOUS

## 2015-04-16 MED ORDER — BISACODYL 5 MG PO TBEC
5.0000 mg | DELAYED_RELEASE_TABLET | Freq: Every day | ORAL | Status: DC | PRN
Start: 2015-04-16 — End: 2015-04-19
  Filled 2015-04-16: qty 1

## 2015-04-16 MED ORDER — SENNOSIDES-DOCUSATE SODIUM 8.6-50 MG PO TABS: 1.0000 | ORAL_TABLET | Freq: Every evening | ORAL | Status: DC | PRN

## 2015-04-16 MED ORDER — AMLODIPINE BESYLATE 5 MG PO TABS
5.0000 mg | ORAL_TABLET | Freq: Every day | ORAL | Status: DC
  Administered 2015-04-17 – 2015-04-19 (×3): 5 mg via ORAL
  Filled 2015-04-16 (×3): qty 1

## 2015-04-16 MED ORDER — ONDANSETRON HCL 4 MG/2ML IJ SOLN
4.0000 mg | Freq: Once | INTRAMUSCULAR | Status: AC
  Administered 2015-04-16: 4 mg via INTRAVENOUS
  Filled 2015-04-16: qty 2

## 2015-04-16 MED ORDER — BRIMONIDINE TARTRATE-TIMOLOL 0.2-0.5 % OP SOLN
1.0000 [drp] | Freq: Two times a day (BID) | OPHTHALMIC | Status: DC
Start: 2015-04-16 — End: 2015-04-16

## 2015-04-16 MED ORDER — HYDROCODONE-ACETAMINOPHEN 5-325 MG PO TABS
1.0000 | ORAL_TABLET | ORAL | Status: DC | PRN
  Administered 2015-04-17 (×2): 1 via ORAL
  Filled 2015-04-16: qty 2
  Filled 2015-04-16: qty 1

## 2015-04-16 MED ORDER — LATANOPROST 0.005 % OP SOLN
1.0000 [drp] | Freq: Every day | OPHTHALMIC | Status: DC
  Administered 2015-04-17 – 2015-04-18 (×3): 1 [drp] via OPHTHALMIC
  Filled 2015-04-16: qty 2.5

## 2015-04-16 MED ORDER — MORPHINE SULFATE (PF) 2 MG/ML IV SOLN: 2.0000 mg | INTRAVENOUS | Status: DC | PRN

## 2015-04-16 MED ORDER — POTASSIUM CHLORIDE IN NACL 20-0.9 MEQ/L-% IV SOLN
INTRAVENOUS | Status: AC
  Administered 2015-04-16: 22:00:00 via INTRAVENOUS
  Filled 2015-04-16 (×2): qty 1000

## 2015-04-16 MED ORDER — ONDANSETRON HCL 4 MG/2ML IJ SOLN: 4.0000 mg | Freq: Four times a day (QID) | INTRAMUSCULAR | Status: DC | PRN

## 2015-04-16 MED ORDER — COLCHICINE 0.6 MG PO TABS
0.6000 mg | ORAL_TABLET | Freq: Once | ORAL | Status: AC
  Administered 2015-04-16: 0.6 mg via ORAL
  Filled 2015-04-16: qty 1

## 2015-04-16 MED ORDER — KETOROLAC TROMETHAMINE 30 MG/ML IJ SOLN
10.0000 mg | Freq: Once | INTRAMUSCULAR | Status: AC
  Administered 2015-04-16: 9.9 mg via INTRAVENOUS
  Filled 2015-04-16: qty 1

## 2015-04-16 NOTE — H&P (Signed)
[facility] History and Physical  Hector Sloan KCL:[id number] DOB: [birth year] DOA: 04/16/2015  Referring physician: ED physician PCP: [name], MD  Specialists:  Dr. Florene Glen (nephrology), Dr. Diona Fanti (urology)   Chief Complaint:  Bilateral elbow and right knee pain  HPI: Hector Sloan is a 75 y.o. male with PMH of type 2 diabetes mellitus, gout, chronic kidney disease stage III, and bladder cancer status post resection and recurrence who presents to the ED with severe bilateral elbow pain and right knee pain. Patient has history of gout in the bilateral elbows, more often the right side, and developed severe right elbow tenderness approximately 5 days ago. He does not take gout prophylaxis and typically allows the flares to resolve without any specific treatment. The patient suffered a mechanical fall at home 2 days prior to his presentation and attributes his left elbow and right knee pain to this event. He had immediate pain following the fall but remained ambulatory. He has been ambulating with great difficulty however due to pain and his performance of ADLs has been compromised by the bilateral elbow pain that limits his ROM. The elbow pain is described as severe, constant, sharp, and localized to the elbows. Elbow pain is exacerbated by movement or palpation and relieved somewhat by rest. The right knee pain is described as severe, constant, achy, localized to the knee, and worse with weightbearing, flexion, or palpation of the patella. He denies any recent fever, chills, nausea, vomiting, chest pain, palpitations, lightheadedness, or headaches.  In ED, patient was found to be afebrile, saturating well on room air, and with sinus tachycardia to the 120s. Radiographs of the elbows and right knee were obtained and negative for acute fracture. Chronic degenerative changes were noted and all 3 image to joints. A small effusion was noted at the right knee, and effusion was also  noted at the right elbow. There is no obvious effusion of the left elbow. Patient received aggressive pain management in the emergency department and was bolused with 1.5 L of normal saline but remained tachycardic. Blood work returned with a normocytic anemia, leukocytosis, acute on chronic renal insufficiency, lactic acid of 2.26, and a sedimentation rate of 135. Orthopedic consultation was requested by the emergency department and the consultant has agreed to evaluate the patient. Given the severe pain with tachycardia and metabolic derangements, patient will be admitted to the hospital for ongoing evaluation and management.  Where does patient live?   At home     Can patient participate in ADLs?  Yes      Review of Systems:   General: no fevers, chills, sweats, weight change, poor appetite, or fatigue HEENT: no blurry vision, hearing changes or sore throat Pulm: no dyspnea, cough, or wheeze CV: no chest pain or palpitations Abd: no nausea, vomiting, abdominal pain, diarrhea, or constipation GU: no dysuria, hematuria, increased urinary frequency, or urgency  Ext: no leg edema Neuro: no focal weakness, numbness, or tingling, no vision change or hearing loss Skin: no rash, no wounds MSK:  Right elbow pain prior to recent mechanical fall; left elbow and right knee pain started at time of fall.  Heme: No easy bruising or bleeding Travel history: No recent long distant travel    Allergy: No Known Allergies  Past Medical History  Diagnosis Date  . Hypertension   . S/P BKA (below knee amputation) (HCC) IN 1998    LEFT/  HAS PROTHESIS  . History of kidney stones   . Peripheral vascular disease (HCC) S/P LEFT  BKA 1998  . Glaucoma of both eyes   . Type 2 diabetes mellitus ([place])   . Diabetic retinopathy ([place])   . 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 CKD (chronic kidney disease), stage III     nephrologist--  dr  Florene Glen  . At risk for sleep apnea     STOP-BANG= 4    SENT TO PCP 11-22-2013    Past Surgical History  Procedure Laterality Date  . Transurethral resection of bladder tumor  12-03-2010 ;   07-16-2010;,  03-12-2010    BLADDER CANCER (2011 W/ INSTILLATION CHEMO IN BLADDER)  . Multiple eye surg's (most of them of left)  LAST ONE 2009  (LEFT EYE)    INCLUDING VITRECTOMY'S / REPAIR'S  DETACHED RETINA/  GLAUCOMA SETON'S  . Below knee leg amputation Left 1998  . Cystoscopy with biopsy  06/03/2011    Procedure: CYSTOSCOPY WITH BIOPSY;  Surgeon: Franchot Gallo, MD;  Location[facility];  Service: Urology;  Laterality: N/A;  . Cataract extraction w/ intraocular lens  implant, bilateral    . Cystoscopy w/ retrogrades  10/29/2011    Procedure: CYSTOSCOPY WITH RETROGRADE PYELOGRAM;  Surgeon: Franchot Gallo, MD;  Location: [facility];  Service: Urology;  Laterality: Bilateral;  30 MIN REQUESTED   . Transurethral resection of bladder tumor  10/29/2011    Procedure: TRANSURETHRAL RESECTION OF BLADDER TUMOR (TURBT);  Surgeon: Franchot Gallo, MD;  Location: [facility];  Service: Urology;  Laterality: N/A;  . Transurethral resection of bladder tumor  04/04/2012    Procedure: TRANSURETHRAL RESECTION OF BLADDER TUMOR (TURBT);  Surgeon: Franchot Gallo, MD;  Location: [facility];  Service: Urology;  Laterality: N/A;  COLD CUP BIOPSY   . I  &  d right foot  04-22-2006    SECOND DEGREE BURN  . Cystoscopy w/ retrogrades Bilateral 03/02/2013    Procedure: CYSTOSCOPY WITH RETROGRADE PYELOGRAM AND BLADDER BIOPSY;  Surgeon: Franchot Gallo, MD;  Location[facility];  Service: Urology;  Laterality: Bilateral;  . Cystoscopy w/ retrogrades Bilateral 11/27/2013    Procedure: CYSTOSCOPY WITH RETROGRADE PYELOGRAM;  Surgeon: Jorja Loa, MD;  Location: [facility];  Service: Urology;  Laterality: Bilateral;  .  Cystoscopy with biopsy N/A 11/27/2013    Procedure: CYSTOSCOPY WITH BLADDER BIOPSIES, BLADDER AND RENAL WASHINGS;  Surgeon: Jorja Loa, MD;  Location: [facility];  Service: Urology;  Laterality: N/A;    Social History:  reports that he quit smoking about 24 years ago. His smoking use included Cigarettes. He has a 5 pack-year smoking history. He has never used smokeless tobacco. He reports that he does not drink alcohol or use illicit drugs.  Family History:  Family History  Problem Relation Age of Onset  . Diabetes Mellitus II Other      Prior to Admission medications   Medication Sig Start Date End Date Taking? Authorizing Provider  amLODipine (NORVASC) 5 MG tablet Take 5 mg by mouth daily. 04/05/15  Yes Historical Provider, MD  atorvastatin (LIPITOR) 20 MG tablet Take 20 mg by mouth daily. 03/20/15  Yes Historical Provider, MD  bimatoprost (LUMIGAN) 0.03 % ophthalmic solution Place 1 drop into both eyes at bedtime.    Yes Historical Provider, MD  brimonidine-timolol (COMBIGAN) 0.2-0.5 % ophthalmic solution Place 1 drop into both eyes every 12 (twelve) hours.   Yes Historical  Provider, MD  FLUZONE HIGH-DOSE 0.5 ML SUSY Inject 1 Dose as directed once. 01/11/15  Yes Historical Provider, MD  HYDROcodone-acetaminophen (NORCO) 5-325 MG per tablet Take 1-2 tablets by mouth every 4 (four) hours as needed for moderate pain. 11/27/13   Franchot Gallo, MD  KOMBIGLYZE XR 5-500 MG TB24 Take 1 tablet by mouth daily. 04/05/15  Yes Historical Provider, MD  latanoprost (XALATAN) 0.005 % ophthalmic solution Place 1 drop into both eyes at bedtime. 03/26/15  Yes Historical Provider, MD  losartan-hydrochlorothiazide (HYZAAR) 100-25 MG tablet Take 1 tablet by mouth daily. 04/05/15  Yes Historical Provider, MD  omeprazole (PRILOSEC) 20 MG capsule Take 20 mg by mouth daily. 03/26/15  Yes Historical Provider, MD  pioglitazone (ACTOS) 30 MG tablet Take 30 mg by mouth daily.   Yes Historical  Provider, MD    Physical Exam: Filed Vitals:   04/16/15 1800 04/16/15 1808 04/16/15 1857 04/16/15 2024  BP: 126/87  125/65 127/69  Pulse: 115  108 105  Temp:   98.7 F (37.1 C)   TempSrc:   Oral   Resp: 23  17 12   Weight:  92.08 kg (203 lb)    SpO2: 100%  97% 99%   General: Not in acute distress HEENT:       Eyes: PERRL, EOMI, no scleral icterus or conjunctival pallor. Small cataract right lens       ENT: No discharge from the ears or nose, no pharyngeal ulcers, petechiae or exudate, no tonsillar enlargement.        Neck: No JVD, no bruit, no appreciable mass Heme: No cervical adenopathy, no pallor Cardiac:  Rate ~120 and regular. No murmurs, No gallops or rubs. Pulm: Good air movement bilaterally. No rales, wheezing, rhonchi or rubs. Abd: Soft, nondistended, nontender, no rebound pain or gaurding, no mass or organomegaly, BS present. Ext: No LE edema. 1+DP/PT pulse on right knee. Rt LE s/p BKA, see MSK section below for additional findings.  Musculoskeletal: Left leg s/p BKA.  Right knee ecchymosis and superficial abrasian overlies anterolateral aspect, tender at patella, no joint line tenderness, no appreciable effusion, negative L[place]mans, stable to varus/valgus stressing.  Right elbow faint erythema, edematous, hot relative to left, exquisitely tender over olecranon, flexion limited by pain. Left elbow no gross deformity, discoloration, heat, or swelling; mildly tender at lateral epicondyle, exacerbated by wrist extension.   Skin: No rashes or wounds on exposed surfaces  Neuro: Alert, oriented X3, cranial nerves II-XII grossly intact. No focal findings Psych: Patient is not overtly psychotic, appropriate mood and affect.  Labs on Admission:  Basic Metabolic Panel:  Recent Labs Lab 04/16/15 1434  NA 136  K 3.8  CL 103  CO2 21*  GLUCOSE 301*  BUN 53*  CREATININE 2.08*  CALCIUM 9.0   Liver Function Tests:  Recent Labs Lab 04/16/15 1434  AST 35  ALT 21  ALKPHOS 75   BILITOT 0.8  PROT 7.8  ALBUMIN 2.9*   No results for input(s): LIPASE, AMYLASE in the last 168 hours. No results for input(s): AMMONIA in the last 168 hours. CBC:  Recent Labs Lab 04/16/15 1434  WBC 13.4*  HGB 9.8*  HCT 29.8*  MCV 83.0  PLT 357   Cardiac Enzymes: No results for input(s): CKTOTAL, CKMB, CKMBINDEX, TROPONINI in the last 168 hours.  BNP (last 3 results) No results for input(s): BNP in the last 8760 hours.  ProBNP (last 3 results) No results for input(s): PROBNP in the last 8760 hours.  CBG: No results  for input(s): GLUCAP in the last 168 hours.  Radiological Exams on Admission: Dg Chest 2 View  04/16/2015  CLINICAL DATA:  Sepsis EXAM: CHEST  2 VIEW COMPARISON:  03/12/2010 FINDINGS: Cardiomediastinal silhouette is stable. Stable chronic pleural parenchymal scarring and pleural calcifications left lower hemi thorax. No definite superimposed infiltrate or pulmonary edema. Mild degenerative changes thoracic spine. IMPRESSION: Stable chronic pleural parenchymal scarring and pleural calcifications left lower hemi thorax. No definite superimposed infiltrate or pulmonary edema. Mild degenerative changes thoracic spine. Electronically Signed   By: Lahoma Crocker M.D.   On: 04/16/2015 16:09   Dg Elbow Complete Left  04/16/2015  CLINICAL DATA:  Pt from home. Pt had a fall on Sunday. Was seen for this and no injury noted after fall. Today pt complains of bilateral elbow pain. Hx of gout in R elbow, L elbow pain new. Pt also complains of worsening R knee pain. EXAM: LEFT ELBOW - COMPLETE 3+ VIEW COMPARISON:  None. FINDINGS: No convincing fracture. No dislocation. There is some subchondral cystic-type change along the capitellum. An enthesiophyte is noted at the insertion of the triceps tendon. Possible small joint effusion. Soft tissues are unremarkable. IMPRESSION: 1. No fracture or dislocation. 2. Mild arthropathic changes.  Possible small joint effusion. Electronically Signed    By: Lajean Manes M.D.   On: 04/16/2015 16:03   Dg Elbow Complete Right  04/16/2015  CLINICAL DATA:  Fall on Sunday. Bilateral elbow pain reported today. History of gout. Initial encounter. EXAM: RIGHT ELBOW - COMPLETE 3+ VIEW COMPARISON:  None. FINDINGS: Large elbow joint effusion without visible fracture or dislocation. No erosive changes. Mild marginal spurring about the elbow without joint narrowing. IMPRESSION: Joint effusion without acute osseous finding. If there is preceding trauma recommend close follow-up for occult radial head fracture. Electronically Signed   By: Monte Fantasia M.D.   On: 04/16/2015 16:03   Dg Knee Complete 4 Views Right  04/16/2015  CLINICAL DATA:  fall on Sunday. Was seen for this and no injury noted after fall. Today pt complains of bilateral elbow pain. Hx of gout in R elbow, L elbow pain new. Pt also complains of worsening R knee pain. Pain with extension and bearing weight. Hx HTN. Diabetic. Former smoker. EXAM: RIGHT KNEE - COMPLETE 4+ VIEW COMPARISON:  None. FINDINGS: There is no evidence of fracture, dislocation. Small effusion in the suprapatellar bursa. Small marginal spurs from the patellar articular surface and medial femoral condyles. There is no other evidence of arthropathy or other focal bone abnormality. Tibial arterial calcifications are noted. Soft tissues are unremarkable. IMPRESSION: 1. Negative for fracture or other acute bone abnormality. 2. Mild patellar and medial compartment degenerative spurring, with small effusion. Electronically Signed   By: Lucrezia Europe M.D.   On: 04/16/2015 16:03    EKG: Independently reviewed.  Abnormal findings:  Sinus tachycardia, PACs   Assessment/Plan  1. Acute gout, right elbow   - Right elbow pain not related to fall or trauma per pt, started prior to fall  - Pt is afebrile and gout flare tops the Ddx, but with leukocytosis and elevated lactate, concern for septic arthritis remains  - Ideally, effusion could be  tapped for analysis  - Pain improved with colchicine in the ED, but with the patients acute on chronic renal failure, corticosteroid might be a better treatment option; will hold off on this for now though until seen by ortho as some concern for septic joint remains  - Check uric acid level  -  Trend ESR, CRP    2. Right knee pain, left elbow pain - Secondary to mechanical fall 2 days ago  - No fractures on radiographs  - Joints stable on exam; suspect musculoligamentous elbow sprain and patellar contusion  - Pain-control, avoiding NSAIDs given AKI on CKD 3  - Relative rest, elevation, can try compression and ice  - Anticipate full recovery over next 4-8 wks   3. Normocytic anemia - Hgb 9.8 on admission, previously in 10.5 - 12 range  - Uncertain etio, likely secondary to worsening CKD  - No suggestion of active bleed  - Will check iron studies, B12, folate, and supplement prn   4. AKI on CKD 3 - SCr 2.08 on admission, last measurement in EMR is 1.5 (January 2014)  - May reflect progression of CKD over the last 3 years, but given elevated lactate and tachycardia, suspect an acute injury  - Was bolused 1.5 L NS in ED, will continue with NS at 100 cc/hr overnight  - Expect improvement with IVF, will repeat chem panel in am  - Will avoid nephrotoxins as feasible; if ortho has low suspicion for septic joint, will likely treat gout with systemic steroid rather than colchicine    5. Tachycardia, sinus  - Persists after fluid resuscitation and aggressive attempts at pain-control  - Will monitor on telemetry  - Check cardiac biomarkers, continue gentle IVF hydration, check BNP  - If fails to resolve with fluids and pain-control, may need to extend w/u to include TTE    6. Lactic acidosis  - Lactate 2.26 initially, now 2.07 after fluid bolus in ED  - Uncertain if reflective of inflammatory process (i.e. gout), infection (e.g. septic joint), or poor cardiac output considering tachycardia  -  Continuing IVF at 100 cc/hr NS overnight  - Trend lactate     DVT ppx: SQ Heparin    Code Status: Full code Family Communication: None at bed side.     Disposition Plan: Admit to inpatient   Date of Service 04/16/2015    Vianne Bulls, MD [facility] Pager [phone]  If 7PM-7AM, please contact night-coverage [email or web address] Password [facility] 04/16/2015, 8:47 PM

## 2015-04-16 NOTE — ED Provider Notes (Signed)
CSN: [id number]     Arrival date & time 04/16/15  1411 History   First MD Initiated Contact with Patient 04/16/15 1503     Chief Complaint  Patient presents with  . Elbow Pain  . Knee Pain     (Consider location/radiation/quality/duration/timing/severity/associated sxs/prior Treatment) Patient is a 75 y.o. male presenting with arm injury. The history is provided by the patient and the spouse.  Arm Injury Location:  Elbow Time since incident:  2 days Injury: no   Elbow location:  L elbow and R elbow Pain details:    Quality:  Aching   Radiates to:  Does not radiate   Severity:  Severe   Onset quality:  Gradual   Duration:  3 days   Timing:  Constant   Progression:  Unchanged Chronicity:  New Dislocation: no   Prior injury to area:  No Relieved by:  Nothing Worsened by:  Nothing tried Ineffective treatments:  None tried Associated symptoms: no fever     75 yo M with a chief complaint of bilateral elbow pain. This been going on for the past for 5 days. Patient has a history of gout to the right elbow was never had it in the left. Patient also fell 2 days ago. This is a mechanical fall he was reaching for something on the floor and rolled out of the bed landed on his right knee. Patient denies head injury denies loss of consciousness. Denies chest pain abdominal pain. Patient denies fevers or chills. Patient denies decreased oral intake.  Past Medical History  Diagnosis Date  . Hypertension   . S/P BKA (below knee amputation) (HCC) IN 1998    LEFT/  HAS PROTHESIS  . History of kidney stones   . Peripheral vascular disease ([place]) S/P LEFT BKA 1998  . Glaucoma of both eyes   . Type 2 diabetes mellitus ([place])   . Diabetic retinopathy ([place])   . 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 CKD (chronic kidney disease), stage III     nephrologist--  dr Florene Glen  . At risk for sleep apnea     STOP-BANG= 4     SENT TO PCP 11-22-2013   Past Surgical History  Procedure Laterality Date  . Transurethral resection of bladder tumor  12-03-2010 ;   07-16-2010;,  03-12-2010    BLADDER CANCER (2011 W/ INSTILLATION CHEMO IN BLADDER)  . Multiple eye surg's (most of them of left)  LAST ONE 2009  (LEFT EYE)    INCLUDING VITRECTOMY'S / REPAIR'S  DETACHED RETINA/  GLAUCOMA SETON'S  . Below knee leg amputation Left 1998  . Cystoscopy with biopsy  06/03/2011    Procedure: CYSTOSCOPY WITH BIOPSY;  Surgeon: Franchot Gallo, MD;  Location: [facility];  Service: Urology;  Laterality: N/A;  . Cataract extraction w/ intraocular lens  implant, bilateral    . Cystoscopy w/ retrogrades  10/29/2011    Procedure: CYSTOSCOPY WITH RETROGRADE PYELOGRAM;  Surgeon: Franchot Gallo, MD;  Location: [facility];  Service: Urology;  Laterality: Bilateral;  30 MIN REQUESTED   . Transurethral resection of bladder tumor  10/29/2011    Procedure: TRANSURETHRAL RESECTION OF BLADDER TUMOR (TURBT);  Surgeon: Franchot Gallo, MD;  Location: [facility];  Service: Urology;  Laterality: N/A;  . Transurethral resection of bladder tumor  04/04/2012    Procedure: TRANSURETHRAL RESECTION OF BLADDER TUMOR (TURBT);  Surgeon:  Franchot Gallo, MD;  Location: [facility];  Service: Urology;  Laterality: N/A;  COLD CUP BIOPSY   . I  &  d right foot  04-22-2006    SECOND DEGREE BURN  . Cystoscopy w/ retrogrades Bilateral 03/02/2013    Procedure: CYSTOSCOPY WITH RETROGRADE PYELOGRAM AND BLADDER BIOPSY;  Surgeon: Franchot Gallo, MD;  Location: [facility];  Service: Urology;  Laterality: Bilateral;  . Cystoscopy w/ retrogrades Bilateral 11/27/2013    Procedure: CYSTOSCOPY WITH RETROGRADE PYELOGRAM;  Surgeon: Jorja Loa, MD;  Location: [facility];  Service: Urology;  Laterality: Bilateral;  . Cystoscopy with biopsy N/A 11/27/2013    Procedure: CYSTOSCOPY  WITH BLADDER BIOPSIES, BLADDER AND RENAL WASHINGS;  Surgeon: Jorja Loa, MD;  Location: [facility];  Service: Urology;  Laterality: N/A;   Family History  Problem Relation Age of Onset  . Diabetes Mellitus II Other    Social History  Substance Use Topics  . Smoking status: Former Smoker -- 1.00 packs/day for 5 years    Types: Cigarettes    Quit date: 05/29/1990  . Smokeless tobacco: Never Used  . Alcohol Use: No    Review of Systems  Constitutional: Negative for fever and chills.  HENT: Negative for congestion and facial swelling.   Eyes: Negative for discharge and visual disturbance.  Respiratory: Negative for shortness of breath.   Cardiovascular: Negative for chest pain and palpitations.  Gastrointestinal: Negative for vomiting, abdominal pain and diarrhea.  Musculoskeletal: Positive for myalgias and arthralgias.  Skin: Negative for color change and rash.  Neurological: Negative for tremors, syncope and headaches.  Psychiatric/Behavioral: Negative for confusion and dysphoric mood.      Allergies  Review of patient's allergies indicates no known allergies.  Home Medications   Prior to Admission medications   Medication Sig Start Date End Date Taking? Authorizing Provider  amLODipine (NORVASC) 5 MG tablet Take 5 mg by mouth daily. 04/05/15  Yes Historical Provider, MD  atorvastatin (LIPITOR) 20 MG tablet Take 20 mg by mouth daily. 03/20/15  Yes Historical Provider, MD  bimatoprost (LUMIGAN) 0.03 % ophthalmic solution Place 1 drop into both eyes at bedtime.    Yes Historical Provider, MD  brimonidine-timolol (COMBIGAN) 0.2-0.5 % ophthalmic solution Place 1 drop into both eyes every 12 (twelve) hours.   Yes Historical Provider, MD  FLUZONE HIGH-DOSE 0.5 ML SUSY Inject 1 Dose as directed once. 01/11/15  Yes Historical Provider, MD  HYDROcodone-acetaminophen (NORCO) 5-325 MG per tablet Take 1-2 tablets by mouth every 4 (four) hours as needed for moderate  pain. 11/27/13   Franchot Gallo, MD  KOMBIGLYZE XR 5-500 MG TB24 Take 1 tablet by mouth daily. 04/05/15  Yes Historical Provider, MD  latanoprost (XALATAN) 0.005 % ophthalmic solution Place 1 drop into both eyes at bedtime. 03/26/15  Yes Historical Provider, MD  losartan-hydrochlorothiazide (HYZAAR) 100-25 MG tablet Take 1 tablet by mouth daily. 04/05/15  Yes Historical Provider, MD  omeprazole (PRILOSEC) 20 MG capsule Take 20 mg by mouth daily. 03/26/15  Yes Historical Provider, MD  pioglitazone (ACTOS) 30 MG tablet Take 30 mg by mouth daily.   Yes Historical Provider, MD   BP 128/60 mmHg  Pulse 104  Temp(Src) 98.7 F (37.1 C) (Oral)  Resp 19  Wt 203 lb (92.08 kg)  SpO2 97% Physical Exam  Constitutional: He is oriented to person, place, and time. He appears well-developed and well-nourished.  HENT:  Head: Normocephalic and atraumatic.  Eyes: EOM are normal. Pupils  are equal, round, and reactive to light.  Neck: Normal range of motion. Neck supple. No JVD present.  Cardiovascular: Regular rhythm.  Tachycardia present.  Exam reveals no gallop and no friction rub.   No murmur heard. Pulmonary/Chest: No respiratory distress. He has no wheezes.  Abdominal: He exhibits no distension. There is no tenderness. There is no rebound and no guarding.  Musculoskeletal: Normal range of motion. He exhibits edema and tenderness.  Patient was swelling to the left elbow pain worse with movement palpation. Mildly warm compared to the rest of the body. Pulse motor and sensation intact. Patient also has some tenderness to the right elbow. Right knee with a mild abrasion to the lateral aspect as well as some bruising to the right lateral aspect of the calf.  Neurological: He is alert and oriented to person, place, and time.  Skin: No rash noted. No pallor.  Psychiatric: He has a normal mood and affect. His behavior is normal.  Nursing note and vitals reviewed.   ED Course  Procedures (including critical  care time) Labs Review Labs Reviewed  COMPREHENSIVE METABOLIC PANEL - Abnormal; Notable for the following:    CO2 21 (*)    Glucose, Bld 301 (*)    BUN 53 (*)    Creatinine, Ser 2.08 (*)    Albumin 2.9 (*)    GFR calc non Af Amer 29 (*)    GFR calc Af Amer 34 (*)    All other components within normal limits  URINALYSIS, ROUTINE W REFLEX MICROSCOPIC (NOT AT [facility]) - Abnormal; Notable for the following:    APPearance CLOUDY (*)    Protein, ur 100 (*)    All other components within normal limits  CBC - Abnormal; Notable for the following:    WBC 13.4 (*)    RBC 3.59 (*)    Hemoglobin 9.8 (*)    HCT 29.8 (*)    RDW 15.6 (*)    All other components within normal limits  SEDIMENTATION RATE - Abnormal; Notable for the following:    Sed Rate 135 (*)    All other components within normal limits  URINE MICROSCOPIC-ADD ON - Abnormal; Notable for the following:    Squamous Epithelial / LPF 0-5 (*)    Bacteria, UA FEW (*)    Casts HYALINE CASTS (*)    All other components within normal limits  BRAIN NATRIURETIC PEPTIDE - Abnormal; Notable for the following:    B Natriuretic Peptide 128.9 (*)    All other components within normal limits  URIC ACID - Abnormal; Notable for the following:    Uric Acid, Serum 8.9 (*)    All other components within normal limits  I-STAT CG4 LACTIC ACID, ED - Abnormal; Notable for the following:    Lactic Acid, Venous 2.26 (*)    All other components within normal limits  I-STAT CG4 LACTIC ACID, ED - Abnormal; Notable for the following:    Lactic Acid, Venous 2.07 (*)    All other components within normal limits  CBG MONITORING, ED - Abnormal; Notable for the following:    Glucose-Capillary 205 (*)    All other components within normal limits  URINE CULTURE  BODY FLUID CULTURE  PROCALCITONIN  TROPONIN I  TSH  CBC WITH DIFFERENTIAL/PLATELET  COMPREHENSIVE METABOLIC PANEL  TROPONIN I  SEDIMENTATION RATE  C-REACTIVE PROTEIN  C-REACTIVE PROTEIN   FERRITIN  IRON AND TIBC  FOLATE RBC  HEMOGLOBIN A1C  VITAMIN B12  I-STAT CG4 LACTIC ACID, ED  I-STAT CG4 LACTIC ACID, ED    Imaging Review Dg Chest 2 View  04/16/2015  CLINICAL DATA:  Sepsis EXAM: CHEST  2 VIEW COMPARISON:  03/12/2010 FINDINGS: Cardiomediastinal silhouette is stable. Stable chronic pleural parenchymal scarring and pleural calcifications left lower hemi thorax. No definite superimposed infiltrate or pulmonary edema. Mild degenerative changes thoracic spine. IMPRESSION: Stable chronic pleural parenchymal scarring and pleural calcifications left lower hemi thorax. No definite superimposed infiltrate or pulmonary edema. Mild degenerative changes thoracic spine. Electronically Signed   By: Lahoma Crocker M.D.   On: 04/16/2015 16:09   Dg Elbow Complete Left  04/16/2015  CLINICAL DATA:  Pt from home. Pt had a fall on Sunday. Was seen for this and no injury noted after fall. Today pt complains of bilateral elbow pain. Hx of gout in R elbow, L elbow pain new. Pt also complains of worsening R knee pain. EXAM: LEFT ELBOW - COMPLETE 3+ VIEW COMPARISON:  None. FINDINGS: No convincing fracture. No dislocation. There is some subchondral cystic-type change along the capitellum. An enthesiophyte is noted at the insertion of the triceps tendon. Possible small joint effusion. Soft tissues are unremarkable. IMPRESSION: 1. No fracture or dislocation. 2. Mild arthropathic changes.  Possible small joint effusion. Electronically Signed   By: Lajean Manes M.D.   On: 04/16/2015 16:03   Dg Elbow Complete Right  04/16/2015  CLINICAL DATA:  Fall on Sunday. Bilateral elbow pain reported today. History of gout. Initial encounter. EXAM: RIGHT ELBOW - COMPLETE 3+ VIEW COMPARISON:  None. FINDINGS: Large elbow joint effusion without visible fracture or dislocation. No erosive changes. Mild marginal spurring about the elbow without joint narrowing. IMPRESSION: Joint effusion without acute osseous finding. If there is  preceding trauma recommend close follow-up for occult radial head fracture. Electronically Signed   By: Monte Fantasia M.D.   On: 04/16/2015 16:03   Dg Knee Complete 4 Views Right  04/16/2015  CLINICAL DATA:  fall on Sunday. Was seen for this and no injury noted after fall. Today pt complains of bilateral elbow pain. Hx of gout in R elbow, L elbow pain new. Pt also complains of worsening R knee pain. Pain with extension and bearing weight. Hx HTN. Diabetic. Former smoker. EXAM: RIGHT KNEE - COMPLETE 4+ VIEW COMPARISON:  None. FINDINGS: There is no evidence of fracture, dislocation. Small effusion in the suprapatellar bursa. Small marginal spurs from the patellar articular surface and medial femoral condyles. There is no other evidence of arthropathy or other focal bone abnormality. Tibial arterial calcifications are noted. Soft tissues are unremarkable. IMPRESSION: 1. Negative for fracture or other acute bone abnormality. 2. Mild patellar and medial compartment degenerative spurring, with small effusion. Electronically Signed   By: Lucrezia Europe M.D.   On: 04/16/2015 16:03   I have personally reviewed and evaluated these images and lab results as part of my medical decision-making.   EKG Interpretation   Date/Time:  Tuesday April 16 2015 15:05:23 EST Ventricular Rate:  120 PR Interval:  177 QRS Duration: 82 QT Interval:  323 QTC Calculation: 456 R Axis:   53 Text Interpretation:  Sinus tachycardia Atrial premature complexes No  significant change since last tracing Confirmed by Scottie Metayer MD, Quillian Quince  ([id number]) on 04/16/2015 6:30:26 PM      MDM   Final diagnoses:  Acute gout of elbow, unspecified cause, unspecified laterality  Tachycardia    75 yo M with a chief complaints of bilateral elbow pain as well as right knee pain. The left elbow is  significant swollen. Mildly warm to touch. Patient was tachycardic into the 120s on arrival. Looks well otherwise. Concern for possible septic arthritis.  ESR elevated.  Xray without large effusion.  Discussed the case with hand surgery Dr. Amedeo Plenty.  He is currently stuck at [facility] is requesting if or so on call here consider the patient. Dr. Percell Miller came to see the patient and discussed arthrocentesis. Admit to the hospitalist for persistent tachycardia.  The patients results and plan were reviewed and discussed.   Any x-rays performed were independently reviewed by myself.   Differential diagnosis were considered with the presenting HPI.  Medications  amLODipine (NORVASC) tablet 5 mg (not administered)  atorvastatin (LIPITOR) tablet 20 mg (not administered)  latanoprost (XALATAN) 0.005 % ophthalmic solution 1 drop (not administered)  pantoprazole (PROTONIX) EC tablet 40 mg (not administered)  heparin injection 5,000 Units (not administered)  sodium chloride 0.9 % injection 3 mL (3 mLs Intravenous Not Given 04/16/15 2221)  0.9 % NaCl with KCl 20 mEq/ L  infusion ( Intravenous New Bag/Given 04/16/15 2223)  acetaminophen (TYLENOL) tablet 650 mg (not administered)    Or  acetaminophen (TYLENOL) suppository 650 mg (not administered)  HYDROcodone-acetaminophen (NORCO/VICODIN) 5-325 MG per tablet 1-2 tablet (not administered)  morphine 2 MG/ML injection 2 mg (not administered)  senna-docusate (Senokot-S) tablet 1 tablet (not administered)  bisacodyl (DULCOLAX) EC tablet 5 mg (not administered)  ondansetron (ZOFRAN) tablet 4 mg (not administered)    Or  ondansetron (ZOFRAN) injection 4 mg (not administered)  insulin aspart (novoLOG) injection 0-15 Units (not administered)  brimonidine (ALPHAGAN) 0.2 % ophthalmic solution 1 drop (not administered)    And  timolol (TIMOPTIC) 0.5 % ophthalmic solution 1 drop (not administered)  sodium chloride 0.9 % bolus 1,000 mL (0 mLs Intravenous Stopped 04/16/15 1807)  morphine 2 MG/ML injection 2 mg (2 mg Intravenous Given 04/16/15 1633)  ondansetron (ZOFRAN) injection 4 mg (4 mg Intravenous Given 04/16/15  1633)  ketorolac (TORADOL) 30 MG/ML injection 9.9 mg (9.9 mg Intravenous Given 04/16/15 1806)  sodium chloride 0.9 % bolus 500 mL (0 mLs Intravenous Stopped 04/16/15 2047)  colchicine tablet 1.2 mg (1.2 mg Oral Given 04/16/15 1803)  colchicine tablet 0.6 mg (0.6 mg Oral Given 04/16/15 2010)    Filed Vitals:   04/16/15 1808 04/16/15 1857 04/16/15 2024 04/16/15 2231  BP:  125/65 127/69 128/60  Pulse:  108 105 104  Temp:  98.7 F (37.1 C)    TempSrc:  Oral    Resp:  17 12 19   Weight: 203 lb (92.08 kg)     SpO2:  97% 99% 97%    Final diagnoses:  Acute gout of elbow, unspecified cause, unspecified laterality  Tachycardia    Admission/ observation were discussed with the admitting physician, patient and/or family and they are comfortable with the plan.       Deno Etienne, DO 04/16/15 2317

## 2015-04-16 NOTE — ED Notes (Signed)
Critical I stat result given to RN and EDP Tyrone Nine

## 2015-04-16 NOTE — ED Notes (Signed)
Bladder scan results 54mL's

## 2015-04-16 NOTE — ED Notes (Signed)
Lactic reported to Dr Thomasene Lot and Dr Jodi Geralds RN

## 2015-04-16 NOTE — Consult Note (Signed)
ORTHOPAEDIC CONSULTATION  REQUESTING PHYSICIAN: Vianne Bulls, MD  Chief Complaint: Left elbow pain.   HPI: Hector Sloan is a 75 y.o. male who complains of pain at his left and right elbow the right has gotten better his left is still relatively severe he reports that a few days ago roughly 5 days ago he fell onto that left side as well as bruising his knees. He has a history of gout diabetes kidney disease. He denies fevers or chills  Past Medical History  Diagnosis Date  . Hypertension   . S/P BKA (below knee amputation) (HCC) IN 1998    LEFT/  HAS PROTHESIS  . History of kidney stones   . Peripheral vascular disease ([place]) S/P LEFT BKA 1998  . Glaucoma of both eyes   . Type 2 diabetes mellitus ([place])   . Diabetic retinopathy ([place])   . 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 CKD (chronic kidney disease), stage III     nephrologist--  dr Florene Glen  . At risk for sleep apnea     STOP-BANG= 4    SENT TO PCP 11-22-2013   Past Surgical History  Procedure Laterality Date  . Transurethral resection of bladder tumor  12-03-2010 ;   07-16-2010;,  03-12-2010    BLADDER CANCER (2011 W/ INSTILLATION CHEMO IN BLADDER)  . Multiple eye surg's (most of them of left)  LAST ONE 2009  (LEFT EYE)    INCLUDING VITRECTOMY'S / REPAIR'S  DETACHED RETINA/  GLAUCOMA SETON'S  . Below knee leg amputation Left 1998  . Cystoscopy with biopsy  06/03/2011    Procedure: CYSTOSCOPY WITH BIOPSY;  Surgeon: Franchot Gallo, MD;  Location: [facility];  Service: Urology;  Laterality: N/A;  . Cataract extraction w/ intraocular lens  implant, bilateral    . Cystoscopy w/ retrogrades  10/29/2011    Procedure: CYSTOSCOPY WITH RETROGRADE PYELOGRAM;  Surgeon: Franchot Gallo, MD;  Location: [facility];  Service: Urology;  Laterality: Bilateral;  30 MIN REQUESTED   . Transurethral resection of bladder tumor   10/29/2011    Procedure: TRANSURETHRAL RESECTION OF BLADDER TUMOR (TURBT);  Surgeon: Franchot Gallo, MD;  Location: [facility];  Service: Urology;  Laterality: N/A;  . Transurethral resection of bladder tumor  04/04/2012    Procedure: TRANSURETHRAL RESECTION OF BLADDER TUMOR (TURBT);  Surgeon: Franchot Gallo, MD;  Location: [facility];  Service: Urology;  Laterality: N/A;  COLD CUP BIOPSY   . I  &  d right foot  04-22-2006    SECOND DEGREE BURN  . Cystoscopy w/ retrogrades Bilateral 03/02/2013    Procedure: CYSTOSCOPY WITH RETROGRADE PYELOGRAM AND BLADDER BIOPSY;  Surgeon: Franchot Gallo, MD;  Location: [facility];  Service: Urology;  Laterality: Bilateral;  . Cystoscopy w/ retrogrades Bilateral 11/27/2013    Procedure: CYSTOSCOPY WITH RETROGRADE PYELOGRAM;  Surgeon: Jorja Loa, MD;  Location: [facility];  Service: Urology;  Laterality: Bilateral;  . Cystoscopy with biopsy N/A 11/27/2013    Procedure: CYSTOSCOPY WITH BLADDER BIOPSIES, BLADDER AND RENAL WASHINGS;  Surgeon: Jorja Loa, MD;  Location: [facility];  Service: Urology;  Laterality: N/A;   Social History   Social History  . Marital Status: Single    Spouse Name: N/A  . Number of Children: N/A  . Years of Education: N/A   Social History  Main Topics  . Smoking status: Former Smoker -- 1.00 packs/day for 5 years    Types: Cigarettes    Quit date: 05/29/1990  . Smokeless tobacco: Never Used  . Alcohol Use: No  . Drug Use: No  . Sexual Activity: Not Asked   Other Topics Concern  . None   Social History Narrative   Family History  Problem Relation Age of Onset  . Diabetes Mellitus II Other    No Known Allergies Prior to Admission medications   Medication Sig Start Date End Date Taking? Authorizing Provider  amLODipine (NORVASC) 5 MG tablet Take 5 mg by mouth daily. 04/05/15  Yes Historical Provider, MD  atorvastatin  (LIPITOR) 20 MG tablet Take 20 mg by mouth daily. 03/20/15  Yes Historical Provider, MD  bimatoprost (LUMIGAN) 0.03 % ophthalmic solution Place 1 drop into both eyes at bedtime.    Yes Historical Provider, MD  brimonidine-timolol (COMBIGAN) 0.2-0.5 % ophthalmic solution Place 1 drop into both eyes every 12 (twelve) hours.   Yes Historical Provider, MD  FLUZONE HIGH-DOSE 0.5 ML SUSY Inject 1 Dose as directed once. 01/11/15  Yes Historical Provider, MD  HYDROcodone-acetaminophen (NORCO) 5-325 MG per tablet Take 1-2 tablets by mouth every 4 (four) hours as needed for moderate pain. 11/27/13   Franchot Gallo, MD  KOMBIGLYZE XR 5-500 MG TB24 Take 1 tablet by mouth daily. 04/05/15  Yes Historical Provider, MD  latanoprost (XALATAN) 0.005 % ophthalmic solution Place 1 drop into both eyes at bedtime. 03/26/15  Yes Historical Provider, MD  losartan-hydrochlorothiazide (HYZAAR) 100-25 MG tablet Take 1 tablet by mouth daily. 04/05/15  Yes Historical Provider, MD  omeprazole (PRILOSEC) 20 MG capsule Take 20 mg by mouth daily. 03/26/15  Yes Historical Provider, MD  pioglitazone (ACTOS) 30 MG tablet Take 30 mg by mouth daily.   Yes Historical Provider, MD   Dg Chest 2 View  04/16/2015  CLINICAL DATA:  Sepsis EXAM: CHEST  2 VIEW COMPARISON:  03/12/2010 FINDINGS: Cardiomediastinal silhouette is stable. Stable chronic pleural parenchymal scarring and pleural calcifications left lower hemi thorax. No definite superimposed infiltrate or pulmonary edema. Mild degenerative changes thoracic spine. IMPRESSION: Stable chronic pleural parenchymal scarring and pleural calcifications left lower hemi thorax. No definite superimposed infiltrate or pulmonary edema. Mild degenerative changes thoracic spine. Electronically Signed   By: Lahoma Crocker M.D.   On: 04/16/2015 16:09   Dg Elbow Complete Left  04/16/2015  CLINICAL DATA:  Pt from home. Pt had a fall on Sunday. Was seen for this and no injury noted after fall. Today pt complains  of bilateral elbow pain. Hx of gout in R elbow, L elbow pain new. Pt also complains of worsening R knee pain. EXAM: LEFT ELBOW - COMPLETE 3+ VIEW COMPARISON:  None. FINDINGS: No convincing fracture. No dislocation. There is some subchondral cystic-type change along the capitellum. An enthesiophyte is noted at the insertion of the triceps tendon. Possible small joint effusion. Soft tissues are unremarkable. IMPRESSION: 1. No fracture or dislocation. 2. Mild arthropathic changes.  Possible small joint effusion. Electronically Signed   By: Lajean Manes M.D.   On: 04/16/2015 16:03   Dg Elbow Complete Right  04/16/2015  CLINICAL DATA:  Fall on Sunday. Bilateral elbow pain reported today. History of gout. Initial encounter. EXAM: RIGHT ELBOW - COMPLETE 3+ VIEW COMPARISON:  None. FINDINGS: Large elbow joint effusion without visible fracture or dislocation. No erosive changes. Mild marginal spurring about the elbow without joint narrowing. IMPRESSION: Joint effusion without acute osseous finding.  If there is preceding trauma recommend close follow-up for occult radial head fracture. Electronically Signed   By: Monte Fantasia M.D.   On: 04/16/2015 16:03   Dg Knee Complete 4 Views Right  04/16/2015  CLINICAL DATA:  fall on Sunday. Was seen for this and no injury noted after fall. Today pt complains of bilateral elbow pain. Hx of gout in R elbow, L elbow pain new. Pt also complains of worsening R knee pain. Pain with extension and bearing weight. Hx HTN. Diabetic. Former smoker. EXAM: RIGHT KNEE - COMPLETE 4+ VIEW COMPARISON:  None. FINDINGS: There is no evidence of fracture, dislocation. Small effusion in the suprapatellar bursa. Small marginal spurs from the patellar articular surface and medial femoral condyles. There is no other evidence of arthropathy or other focal bone abnormality. Tibial arterial calcifications are noted. Soft tissues are unremarkable. IMPRESSION: 1. Negative for fracture or other acute bone  abnormality. 2. Mild patellar and medial compartment degenerative spurring, with small effusion. Electronically Signed   By: Lucrezia Europe M.D.   On: 04/16/2015 16:03    Positive ROS: All other systems have been reviewed and were otherwise negative with the exception of those mentioned in the HPI and as above.  Labs cbc  Recent Labs  04/16/15 1434  WBC 13.4*  HGB 9.8*  HCT 29.8*  PLT 357    Labs inflam No results for input(s): CRP in the last 72 hours.  Invalid input(s): ESR  Labs coag No results for input(s): INR, PTT in the last 72 hours.  Invalid input(s): PT   Recent Labs  04/16/15 1434  NA 136  K 3.8  CL 103  CO2 21*  GLUCOSE 301*  BUN 53*  CREATININE 2.08*  CALCIUM 9.0    Physical Exam: Filed Vitals:   04/16/15 1857 04/16/15 2024  BP: 125/65 127/69  Pulse: 108 105  Temp: 98.7 F (37.1 C)   Resp: 17 12   General: Alert, no acute distress Cardiovascular: No pedal edema Respiratory: No cyanosis, no use of accessory musculature GI: No organomegaly, abdomen is soft and non-tender Skin: No lesions in the area of chief complaint other than those listed below in MSK exam.  Neurologic: Sensation intact distally Psychiatric: Patient is competent for consent with normal mood and affect Lymphatic: No axillary or cervical lymphadenopathy  MUSCULOSKELETAL:  Bilateral upper extremities demonstrate wasting of his interosseous muscles he is not sure how long this been going on.  His left elbow has decreased range of motion mild effusion no redness no warmth moderate tenderness painless small arc range of motion. Other extremities are atraumatic with painless ROM and NVI.  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  I will perform an aspiration of his left elbow to confirm no infectious process.   Procedure: After appropriate timeout I performed a aspiration of his left elbow joint using sterile  technique. I obtained 1 mL of straw-colored fluid. He tolerated this procedure well.   Renette Butters, MD Cell [phone]   04/16/2015 9:57 PM

## 2015-04-16 NOTE — ED Notes (Signed)
Bed: WA08 Expected date:  Expected time:  Means of arrival:  Comments: Fancher

## 2015-04-16 NOTE — ED Notes (Addendum)
Pt from home. Pt had a fall on Sunday. Was seen for this and no injury noted after fall. Today pt complains of bilateral elbow pain. Hx of gout in R elbow, L elbow pain new. Pt also complains of worsening R knee pain. Pain with extension and bearing weight. HR increased after arrival to ED and being told he would have to wait.

## 2015-04-17 DIAGNOSIS — M109 Gout, unspecified: Secondary | ICD-10-CM | POA: Diagnosis not present

## 2015-04-17 DIAGNOSIS — M10329 Gout due to renal impairment, unspecified elbow: Secondary | ICD-10-CM

## 2015-04-17 DIAGNOSIS — E872 Acidosis: Secondary | ICD-10-CM | POA: Diagnosis not present

## 2015-04-17 DIAGNOSIS — E1165 Type 2 diabetes mellitus with hyperglycemia: Secondary | ICD-10-CM

## 2015-04-17 LAB — CBC WITH DIFFERENTIAL/PLATELET
Basophils Absolute: 0 10*3/uL (ref 0.0–0.1)
Basophils Relative: 0 %
Eosinophils Absolute: 0.2 10*3/uL (ref 0.0–0.7)
Eosinophils Relative: 3 %
HCT: 38.5 % — ABNORMAL LOW (ref 39.0–52.0)
HEMOGLOBIN: 12.6 g/dL — AB (ref 13.0–17.0)
LYMPHS ABS: 0.4 10*3/uL — AB (ref 0.7–4.0)
LYMPHS PCT: 7 %
MCH: 27.2 pg (ref 26.0–34.0)
MCHC: 32.7 g/dL (ref 30.0–36.0)
MCV: 83 fL (ref 78.0–100.0)
Monocytes Absolute: 0.8 10*3/uL (ref 0.1–1.0)
Monocytes Relative: 13 %
NEUTROS PCT: 77 %
Neutro Abs: 4.6 10*3/uL (ref 1.7–7.7)
Platelets: 203 10*3/uL (ref 150–400)
RBC: 4.64 MIL/uL (ref 4.22–5.81)
RDW: 15.8 % — ABNORMAL HIGH (ref 11.5–15.5)
WBC: 5.9 10*3/uL (ref 4.0–10.5)

## 2015-04-17 LAB — COMPREHENSIVE METABOLIC PANEL
ALK PHOS: 60 U/L (ref 38–126)
ALT: 19 U/L (ref 17–63)
AST: 27 U/L (ref 15–41)
Albumin: 2.2 g/dL — ABNORMAL LOW (ref 3.5–5.0)
Anion gap: 8 (ref 5–15)
BUN: 53 mg/dL — ABNORMAL HIGH (ref 6–20)
CALCIUM: 8.5 mg/dL — AB (ref 8.9–10.3)
CO2: 23 mmol/L (ref 22–32)
CREATININE: 2.24 mg/dL — AB (ref 0.61–1.24)
Chloride: 107 mmol/L (ref 101–111)
GFR, EST AFRICAN AMERICAN: 31 mL/min — AB (ref >60)
GFR, EST NON AFRICAN AMERICAN: 27 mL/min — AB (ref >60)
Glucose, Bld: 273 mg/dL — ABNORMAL HIGH (ref 65–99)
Potassium: 3.9 mmol/L (ref 3.5–5.1)
Sodium: 138 mmol/L (ref 135–145)
Total Bilirubin: 0.5 mg/dL (ref 0.3–1.2)
Total Protein: 6.2 g/dL — ABNORMAL LOW (ref 6.5–8.1)

## 2015-04-17 LAB — GLUCOSE, CAPILLARY
GLUCOSE-CAPILLARY: 155 mg/dL — AB (ref 65–99)
GLUCOSE-CAPILLARY: 152 mg/dL — AB (ref 65–99)
Glucose-Capillary: 140 mg/dL — ABNORMAL HIGH (ref 65–99)

## 2015-04-17 LAB — IRON AND TIBC
IRON: 20 ug/dL — AB (ref 45–182)
Saturation Ratios: 10 % — ABNORMAL LOW (ref 17.9–39.5)
TIBC: 202 ug/dL — ABNORMAL LOW (ref 250–450)
UIBC: 182 ug/dL

## 2015-04-17 LAB — C-REACTIVE PROTEIN
CRP: 20.8 mg/dL — ABNORMAL HIGH (ref <1.0)
CRP: 18.9 mg/dL — ABNORMAL HIGH (ref <1.0)

## 2015-04-17 LAB — VITAMIN B12: VITAMIN B 12: 674 pg/mL (ref 180–914)

## 2015-04-17 LAB — SEDIMENTATION RATE: SED RATE: 69 mm/h — AB (ref 0–16)

## 2015-04-17 LAB — URINE CULTURE: Culture: NO GROWTH

## 2015-04-17 LAB — CBG MONITORING, ED: Glucose-Capillary: 193 mg/dL — ABNORMAL HIGH (ref 65–99)

## 2015-04-17 LAB — TROPONIN I

## 2015-04-17 LAB — FERRITIN: Ferritin: 94 ng/mL (ref 24–336)

## 2015-04-17 NOTE — Progress Notes (Signed)
[facility] PROGRESS NOTE  Hector Sloan [id number] DOB: [birth year] DOA: 04/16/2015 PCP: [name], MD  Assessment/Plan: Principal Problem:   Acute gout due to renal impairment involving elbow - s/p arthrocentesis. Currently awaiting results - Pt reports improvement in his mobility of his arms.  Active Problems:   Elbow pain -Most likely related to principal problem list above. Will continue current regimen as patient reports improvement on this regimen. - Obtain PT evaluation    Acute renal failure superimposed on stage 3 chronic kidney disease (HCC)  - Stable, no problems reported with urination    Lactic acidosis - trending down. No source of infection identified    Hyperglycemia due to type 2 diabetes mellitus ([place]) - Continue sliding scale insulin   Code Status: full Family Communication: no family at bedside. Disposition Plan: Pending improvement in condition   Consultants:  Orthopaedics  Procedures:  None  Antibiotics:  None  HPI/Subjective: Patient has no new complaints states that he feels better today.  Objective: Filed Vitals:   04/17/15 0500 04/17/15 0600  BP: 104/59 105/58  Pulse: 86 81  Temp:    Resp: 16 15    Intake/Output Summary (Last 24 hours) at 04/17/15 1212 Last data filed at 04/16/15 1807  Gross per 24 hour  Intake   1000 ml  Output      0 ml  Net   1000 ml   Filed Weights   04/16/15 1808  Weight: 92.08 kg (203 lb)    Exam:   General:  Patient in no acute distress alert and awake  Cardiovascular: Regular rate and rhythm, no murmurs or rubs  Respiratory: No increased work of breathing, equal chest rise, no wheezes  Abdomen: Soft, nondistended, nontender  Musculoskeletal: No cyanosis or clubbing   Data Reviewed: Basic Metabolic Panel:  Recent Labs Lab 04/16/15 1434 04/17/15 0453  NA 136 138  K 3.8 3.9  CL 103 107  CO2 21* 23  GLUCOSE 301* 273*  BUN 53* 53*  CREATININE 2.08* 2.24*   CALCIUM 9.0 8.5*   Liver Function Tests:  Recent Labs Lab 04/16/15 1434 04/17/15 0453  AST 35 27  ALT 21 19  ALKPHOS 75 60  BILITOT 0.8 0.5  PROT 7.8 6.2*  ALBUMIN 2.9* 2.2*   No results for input(s): LIPASE, AMYLASE in the last 168 hours. No results for input(s): AMMONIA in the last 168 hours. CBC:  Recent Labs Lab 04/16/15 1434 04/17/15 0453  WBC 13.4* 5.9  NEUTROABS  --  4.6  HGB 9.8* 12.6*  HCT 29.8* 38.5*  MCV 83.0 83.0  PLT 357 203   Cardiac Enzymes:  Recent Labs Lab 04/16/15 2113 04/17/15 1043  TROPONINI <0.03 <0.03   BNP (last 3 results)  Recent Labs  04/16/15 2113  BNP 128.9*    ProBNP (last 3 results) No results for input(s): PROBNP in the last 8760 hours.  CBG:  Recent Labs Lab 04/16/15 2147 04/17/15 0850  GLUCAP 205* 193*    Recent Results (from the past 240 hour(s))  Urine culture     Status: None (Preliminary result)   Collection Time: 04/16/15  6:11 PM  Result Value Ref Range Status   Specimen Description URINE, CATHETERIZED  Final   Special Requests NONE  Final   Culture   Final    NO GROWTH < 12 HOURS Performed at [facility]    Report Status PENDING  Incomplete  Body fluid culture     Status: None (Preliminary result)   Collection Time:  04/16/15 10:13 PM  Result Value Ref Range Status   Specimen Description SYNOVIAL ELBOW  Final   Special Requests Normal  Final   Gram Stain   Final    MODERATE WBC PRESENT,BOTH PMN AND MONONUCLEAR NO ORGANISMS SEEN Performed at [facility]    Culture PENDING  Incomplete   Report Status PENDING  Incomplete     Studies: Dg Chest 2 View  04/16/2015  CLINICAL DATA:  Sepsis EXAM: CHEST  2 VIEW COMPARISON:  03/12/2010 FINDINGS: Cardiomediastinal silhouette is stable. Stable chronic pleural parenchymal scarring and pleural calcifications left lower hemi thorax. No definite superimposed infiltrate or pulmonary edema. Mild degenerative changes thoracic spine. IMPRESSION:  Stable chronic pleural parenchymal scarring and pleural calcifications left lower hemi thorax. No definite superimposed infiltrate or pulmonary edema. Mild degenerative changes thoracic spine. Electronically Signed   By: Lahoma Crocker M.D.   On: 04/16/2015 16:09   Dg Elbow Complete Left  04/16/2015  CLINICAL DATA:  Pt from home. Pt had a fall on Sunday. Was seen for this and no injury noted after fall. Today pt complains of bilateral elbow pain. Hx of gout in R elbow, L elbow pain new. Pt also complains of worsening R knee pain. EXAM: LEFT ELBOW - COMPLETE 3+ VIEW COMPARISON:  None. FINDINGS: No convincing fracture. No dislocation. There is some subchondral cystic-type change along the capitellum. An enthesiophyte is noted at the insertion of the triceps tendon. Possible small joint effusion. Soft tissues are unremarkable. IMPRESSION: 1. No fracture or dislocation. 2. Mild arthropathic changes.  Possible small joint effusion. Electronically Signed   By: Lajean Manes M.D.   On: 04/16/2015 16:03   Dg Elbow Complete Right  04/16/2015  CLINICAL DATA:  Fall on Sunday. Bilateral elbow pain reported today. History of gout. Initial encounter. EXAM: RIGHT ELBOW - COMPLETE 3+ VIEW COMPARISON:  None. FINDINGS: Large elbow joint effusion without visible fracture or dislocation. No erosive changes. Mild marginal spurring about the elbow without joint narrowing. IMPRESSION: Joint effusion without acute osseous finding. If there is preceding trauma recommend close follow-up for occult radial head fracture. Electronically Signed   By: Monte Fantasia M.D.   On: 04/16/2015 16:03   Dg Knee Complete 4 Views Right  04/16/2015  CLINICAL DATA:  fall on Sunday. Was seen for this and no injury noted after fall. Today pt complains of bilateral elbow pain. Hx of gout in R elbow, L elbow pain new. Pt also complains of worsening R knee pain. Pain with extension and bearing weight. Hx HTN. Diabetic. Former smoker. EXAM: RIGHT KNEE -  COMPLETE 4+ VIEW COMPARISON:  None. FINDINGS: There is no evidence of fracture, dislocation. Small effusion in the suprapatellar bursa. Small marginal spurs from the patellar articular surface and medial femoral condyles. There is no other evidence of arthropathy or other focal bone abnormality. Tibial arterial calcifications are noted. Soft tissues are unremarkable. IMPRESSION: 1. Negative for fracture or other acute bone abnormality. 2. Mild patellar and medial compartment degenerative spurring, with small effusion. Electronically Signed   By: Lucrezia Europe M.D.   On: 04/16/2015 16:03    Scheduled Meds: . amLODipine  5 mg Oral Daily  . atorvastatin  20 mg Oral Daily  . brimonidine  1 drop Both Eyes Q12H   And  . timolol  1 drop Both Eyes Q12H  . heparin  5,000 Units Subcutaneous 3 times per day  . insulin aspart  0-15 Units Subcutaneous TID WC  . latanoprost  1 drop Both  Eyes QHS  . pantoprazole  40 mg Oral Daily  . sodium chloride  3 mL Intravenous Q12H   Continuous Infusions:   Time spent: > 35 minutes    Velvet Bathe  [facility] Pager [phone]. If 7PM-7AM, please contact night-coverage at [email or web address], password [facility] 04/17/2015, 12:12 PM

## 2015-04-17 NOTE — ED Notes (Signed)
Pt can go up at 11:55

## 2015-04-18 DIAGNOSIS — M109 Gout, unspecified: Secondary | ICD-10-CM | POA: Diagnosis not present

## 2015-04-18 DIAGNOSIS — M10329 Gout due to renal impairment, unspecified elbow: Secondary | ICD-10-CM | POA: Diagnosis not present

## 2015-04-18 LAB — GLUCOSE, CAPILLARY
GLUCOSE-CAPILLARY: 256 mg/dL — AB (ref 65–99)
GLUCOSE-CAPILLARY: 84 mg/dL (ref 65–99)
GLUCOSE-CAPILLARY: 186 mg/dL — AB (ref 65–99)
Glucose-Capillary: 140 mg/dL — ABNORMAL HIGH (ref 65–99)

## 2015-04-18 LAB — HEMOGLOBIN A1C
HEMOGLOBIN A1C: 7 % — AB (ref 4.8–5.6)
MEAN PLASMA GLUCOSE: 154 mg/dL

## 2015-04-18 LAB — PROCALCITONIN: Procalcitonin: 0.85 ng/mL

## 2015-04-18 LAB — C-REACTIVE PROTEIN: CRP: 13.6 mg/dL — ABNORMAL HIGH (ref <1.0)

## 2015-04-18 LAB — FOLATE RBC
Folate, Hemolysate: 257.3 ng/mL
Folate, RBC: 3898 ng/mL (ref >498)
Hematocrit: 6.6 % — CL (ref 37.5–51.0)

## 2015-04-18 LAB — SEDIMENTATION RATE: Sed Rate: 135 mm/hr — ABNORMAL HIGH (ref 0–16)

## 2015-04-18 MED ORDER — COLCHICINE 0.6 MG PO TABS
0.6000 mg | ORAL_TABLET | Freq: Once | ORAL | Status: AC
  Administered 2015-04-18: 0.6 mg via ORAL
  Filled 2015-04-18: qty 1

## 2015-04-18 NOTE — Evaluation (Signed)
Physical Therapy Evaluation Patient Details Name: Hector Sloan MRN: [id number] DOB: 03/13/1941 Today's Date: 04/18/2015   History of Present Illness  75 y.o. male with PMH of type 2 diabetes mellitus, gout, chronic kidney disease stage III, and bladder cancer status post resection and recurrence who presents to the ED with severe bilateral elbow pain and right knee pain and work up for acute gout due to renal impairment involving elbow  Clinical Impression  Pt admitted with above diagnosis. Pt currently with functional limitations due to the deficits listed below (see PT Problem List).  Pt will benefit from skilled PT to increase their independence and safety with mobility to allow discharge to the venue listed below.  Pt able to don his prosthesis however requiring at least mod assist to stand and unable to hold static standing or ambulate today.  Pt feels he will be better once home and vaguely states he has a friend that can assist.  Recommend SNF at this time.  Pt is high fall risk.  If d/c home, pt has at least 5 steps to enter, would recommend ambulance transport home.     Follow Up Recommendations SNF;Supervision/Assistance - 24 hour    Equipment Recommendations  None recommended by PT    Recommendations for Other Services       Precautions / Restrictions Precautions Precautions: Fall Precaution Comments: L LE prosthesis      Mobility  Bed Mobility Overal bed mobility: Needs Assistance;+2 for physical assistance Bed Mobility: Supine to Sit;Sit to Supine     Supine to sit: Mod assist;HOB elevated Sit to supine: Min assist   General bed mobility comments: verbal cues for technique, assist for trunk upright and LEs onto bed  Transfers Overall transfer level: Needs assistance Equipment used: Rolling walker (2 wheeled) Transfers: Sit to/from Stand Sit to Stand: Mod assist;From elevated surface         General transfer comment: verbal cues for technique, pt requiring  increased assist to rise, steady and control descent, posterior lean against bed and pt unable to self correct, attempted x2 however pt unable to remain standing upright  Ambulation/Gait Ambulation/Gait assistance:  (pt unable)              Stairs            Wheelchair Mobility    Modified Rankin (Stroke Patients Only)       Balance Overall balance assessment: Needs assistance;History of Falls         Standing balance support: Bilateral upper extremity supported;During functional activity Standing balance-Leahy Scale: Zero Standing balance comment: requires UE assist and at least mod assist due to posterior lean with standing                             Pertinent Vitals/Pain Pain Assessment: 0-10 Pain Score: 5  Pain Location: bil elbows, L knee Pain Descriptors / Indicators: Sore Pain Intervention(s): Limited activity within patient's tolerance;Monitored during session;Repositioned    Home Living Family/patient expects to be discharged to:: Private residence Living Arrangements: Alone Available Help at Discharge: Friend(s)   Home Access: Stairs to enter   Technical brewer of Steps: 5 Home Layout: One level Home Equipment: Environmental consultant - 2 wheels;Wheelchair - manual      Prior Function Level of Independence: Independent with assistive device(s)         Comments: has L LE prosthesis, has not been using assistive device     Hand Dominance  Extremity/Trunk Assessment   Upper Extremity Assessment:  (reports elbow pain, able to don prosthesis without complaints but reports pain with WBing)           Lower Extremity Assessment: Generalized weakness;LLE deficits/detail   LLE Deficits / Details: hx of L BKA     Communication   Communication: HOH  Cognition Arousal/Alertness: Awake/alert Behavior During Therapy: WFL for tasks assessed/performed Overall Cognitive Status: Within Functional Limits for tasks assessed                       General Comments      Exercises        Assessment/Plan    PT Assessment Patient needs continued PT services  PT Diagnosis Difficulty walking;Generalized weakness   PT Problem List Decreased strength;Decreased activity tolerance;Decreased mobility;Decreased balance;Decreased safety awareness;Decreased knowledge of use of DME;Pain  PT Treatment Interventions DME instruction;Gait training;Functional mobility training;Patient/family education;Therapeutic exercise;Therapeutic activities;Balance training   PT Goals (Current goals can be found in the Care Plan section) Acute Rehab PT Goals PT Goal Formulation: With patient Time For Goal Achievement: 05/02/15 Potential to Achieve Goals: Fair    Frequency Min 3X/week   Barriers to discharge        Co-evaluation               End of Session Equipment Utilized During Treatment: Gait belt Activity Tolerance: Patient limited by fatigue Patient left: in bed;with call bell/phone within reach;with bed alarm set      Functional Assessment Tool Used: clinical judgement Functional Limitation: Mobility: Walking and moving around Mobility: Walking and Moving Around Current Status [id number]): At least 40 percent but less than 60 percent impaired, limited or restricted Mobility: Walking and Moving Around Goal Status [phone]): At least 1 percent but less than 20 percent impaired, limited or restricted    Time: 1359-1414 PT Time Calculation (min) (ACUTE ONLY): 15 min   Charges:   PT Evaluation $PT Eval Moderate Complexity: 1 Procedure     PT G Codes:   PT G-Codes **NOT FOR INPATIENT CLASS** Functional Assessment Tool Used: clinical judgement Functional Limitation: Mobility: Walking and moving around Mobility: Walking and Moving Around Current Status [id number]): At least 40 percent but less than 60 percent impaired, limited or restricted Mobility: Walking and Moving Around Goal Status [phone]): At least 1 percent but  less than 20 percent impaired, limited or restricted    Hector Sloan,KATHrine E 04/18/2015, 3:59 PM Carmelia Bake, PT, DPT 04/18/2015 Pager: [phone]

## 2015-04-18 NOTE — Progress Notes (Signed)
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 Will administer colchicine today - Pt reports improvement in his mobility of his arms.  Active Problems:   Elbow pain - No growth from fluid collected of arthrocentesis -  PT evaluation    Acute renal failure superimposed on stage 3 chronic kidney disease (HCC)  - Stable, no problems reported with urination    Lactic acidosis - trending down. No source of infection identified    Hyperglycemia due to type 2 diabetes mellitus ([place]) - Continue sliding scale insulin   Code Status: full Family Communication: no family at bedside. Disposition Plan: Pending improvement in condition   Consultants:  Orthopaedics  Procedures:  None  Antibiotics:  None  HPI/Subjective: Patient continued to report improvement in condition  Objective: Filed Vitals:   04/18/15 0441 04/18/15 1454  BP: 160/82 149/70  Pulse: 106 100  Temp: 98.2 F (36.8 C) 98.5 F (36.9 C)  Resp: 16 20    Intake/Output Summary (Last 24 hours) at 04/18/15 1531 Last data filed at 04/18/15 1500  Gross per 24 hour  Intake    480 ml  Output   1525 ml  Net  -1045 ml   Filed Weights   04/16/15 1808  Weight: 92.08 kg (203 lb)    Exam:   General:  Patient in no acute distress alert and awake  Cardiovascular: Regular rate and rhythm, no murmurs or rubs  Respiratory: No increased work of breathing, equal chest rise, no wheezes  Abdomen: Soft, nondistended, nontender  Musculoskeletal: No cyanosis or clubbing , elbow edema bilaterally  Data Reviewed: Basic Metabolic Panel:  Recent Labs Lab 04/16/15 1434 04/17/15 0453  NA 136 138  K 3.8 3.9  CL 103 107  CO2 21* 23  GLUCOSE 301* 273*  BUN 53* 53*  CREATININE 2.08* 2.24*  CALCIUM 9.0 8.5*   Liver  Function Tests:  Recent Labs Lab 04/16/15 1434 04/17/15 0453  AST 35 27  ALT 21 19  ALKPHOS 75 60  BILITOT 0.8 0.5  PROT 7.8 6.2*  ALBUMIN 2.9* 2.2*   No results for input(s): LIPASE, AMYLASE in the last 168 hours. No results for input(s): AMMONIA in the last 168 hours. CBC:  Recent Labs Lab 04/16/15 1434 04/17/15 0453  WBC 13.4* 5.9  NEUTROABS  --  4.6  HGB 9.8* 12.6*  HCT 29.8* 38.5*  MCV 83.0 83.0  PLT 357 203   Cardiac Enzymes:  Recent Labs Lab 04/16/15 2113 04/17/15 1043  TROPONINI <0.03 <0.03   BNP (last 3 results)  Recent Labs  04/16/15 2113  BNP 128.9*    ProBNP (last 3 results) No results for input(s): PROBNP in the last 8760 hours.  CBG:  Recent Labs Lab 04/17/15 1306 04/17/15 1637 04/17/15 2103 04/18/15 0730 04/18/15 1142  GLUCAP 155* 152* 140* 140* 256*    Recent Results (from the past 240 hour(s))  Urine culture     Status: None   Collection Time: 04/16/15  6:11 PM  Result Value Ref Range Status   Specimen Description URINE, CATHETERIZED  Final   Special Requests NONE  Final   Culture   Final    NO GROWTH 1 DAY Performed at [facility]    Report Status 04/17/2015 FINAL  Final  Body fluid culture     Status: None (Preliminary result)   Collection  Time: 04/16/15 10:13 PM  Result Value Ref Range Status   Specimen Description SYNOVIAL ELBOW  Final   Special Requests Normal  Final   Gram Stain   Final    MODERATE WBC PRESENT,BOTH PMN AND MONONUCLEAR NO ORGANISMS SEEN    Culture   Final    NO GROWTH 1 DAY Performed at [facility]    Report Status PENDING  Incomplete     Studies: Dg Chest 2 View  04/16/2015  CLINICAL DATA:  Sepsis EXAM: CHEST  2 VIEW COMPARISON:  03/12/2010 FINDINGS: Cardiomediastinal silhouette is stable. Stable chronic pleural parenchymal scarring and pleural calcifications left lower hemi thorax. No definite superimposed infiltrate or pulmonary edema. Mild degenerative changes thoracic  spine. IMPRESSION: Stable chronic pleural parenchymal scarring and pleural calcifications left lower hemi thorax. No definite superimposed infiltrate or pulmonary edema. Mild degenerative changes thoracic spine. Electronically Signed   By: Lahoma Crocker M.D.   On: 04/16/2015 16:09   Dg Elbow Complete Left  04/16/2015  CLINICAL DATA:  Pt from home. Pt had a fall on Sunday. Was seen for this and no injury noted after fall. Today pt complains of bilateral elbow pain. Hx of gout in R elbow, L elbow pain new. Pt also complains of worsening R knee pain. EXAM: LEFT ELBOW - COMPLETE 3+ VIEW COMPARISON:  None. FINDINGS: No convincing fracture. No dislocation. There is some subchondral cystic-type change along the capitellum. An enthesiophyte is noted at the insertion of the triceps tendon. Possible small joint effusion. Soft tissues are unremarkable. IMPRESSION: 1. No fracture or dislocation. 2. Mild arthropathic changes.  Possible small joint effusion. Electronically Signed   By: Lajean Manes M.D.   On: 04/16/2015 16:03   Dg Elbow Complete Right  04/16/2015  CLINICAL DATA:  Fall on Sunday. Bilateral elbow pain reported today. History of gout. Initial encounter. EXAM: RIGHT ELBOW - COMPLETE 3+ VIEW COMPARISON:  None. FINDINGS: Large elbow joint effusion without visible fracture or dislocation. No erosive changes. Mild marginal spurring about the elbow without joint narrowing. IMPRESSION: Joint effusion without acute osseous finding. If there is preceding trauma recommend close follow-up for occult radial head fracture. Electronically Signed   By: Monte Fantasia M.D.   On: 04/16/2015 16:03   Dg Knee Complete 4 Views Right  04/16/2015  CLINICAL DATA:  fall on Sunday. Was seen for this and no injury noted after fall. Today pt complains of bilateral elbow pain. Hx of gout in R elbow, L elbow pain new. Pt also complains of worsening R knee pain. Pain with extension and bearing weight. Hx HTN. Diabetic. Former smoker.  EXAM: RIGHT KNEE - COMPLETE 4+ VIEW COMPARISON:  None. FINDINGS: There is no evidence of fracture, dislocation. Small effusion in the suprapatellar bursa. Small marginal spurs from the patellar articular surface and medial femoral condyles. There is no other evidence of arthropathy or other focal bone abnormality. Tibial arterial calcifications are noted. Soft tissues are unremarkable. IMPRESSION: 1. Negative for fracture or other acute bone abnormality. 2. Mild patellar and medial compartment degenerative spurring, with small effusion. Electronically Signed   By: Lucrezia Europe M.D.   On: 04/16/2015 16:03    Scheduled Meds: . amLODipine  5 mg Oral Daily  . atorvastatin  20 mg Oral Daily  . brimonidine  1 drop Both Eyes Q12H   And  . timolol  1 drop Both Eyes Q12H  . heparin  5,000 Units Subcutaneous 3 times per day  . insulin aspart  0-15 Units Subcutaneous  TID WC  . latanoprost  1 drop Both Eyes QHS  . pantoprazole  40 mg Oral Daily  . sodium chloride  3 mL Intravenous Q12H   Continuous Infusions:   Time spent: > 35 minutes    Velvet Bathe  [facility] Pager [phone]. If 7PM-7AM, please contact night-coverage at [email or web address], password [facility] 04/18/2015, 3:31 PM

## 2015-04-19 DIAGNOSIS — M109 Gout, unspecified: Secondary | ICD-10-CM | POA: Diagnosis not present

## 2015-04-19 DIAGNOSIS — M10329 Gout due to renal impairment, unspecified elbow: Secondary | ICD-10-CM | POA: Diagnosis not present

## 2015-04-19 DIAGNOSIS — E1165 Type 2 diabetes mellitus with hyperglycemia: Secondary | ICD-10-CM | POA: Diagnosis not present

## 2015-04-19 LAB — GLUCOSE, CAPILLARY
GLUCOSE-CAPILLARY: 239 mg/dL — AB (ref 65–99)
Glucose-Capillary: 159 mg/dL — ABNORMAL HIGH (ref 65–99)

## 2015-04-19 LAB — C-REACTIVE PROTEIN: CRP: 10.7 mg/dL — ABNORMAL HIGH (ref <1.0)

## 2015-04-19 LAB — SEDIMENTATION RATE: SED RATE: 137 mm/h — AB (ref 0–16)

## 2015-04-19 MED ORDER — HYDROCODONE-ACETAMINOPHEN 5-325 MG PO TABS
1.0000 | ORAL_TABLET | ORAL | Status: DC | PRN
Start: 2015-04-19 — End: 2015-05-14

## 2015-04-19 MED ORDER — COLCHICINE 0.6 MG PO TABS: 0.6000 mg | ORAL_TABLET | Freq: Every day | ORAL | Status: AC

## 2015-04-19 MED ORDER — AMLODIPINE BESYLATE 10 MG PO TABS: 10.0000 mg | ORAL_TABLET | Freq: Every day | ORAL | Status: AC

## 2015-04-19 NOTE — Care Management Obs Status (Signed)
MEDICARE OBSERVATION STATUS NOTIFICATION   Patient Details  Name: Hector Sloan MRN: [id number] Date of Birth: November 23, 1940   Medicare Observation Status Notification Given:       Purcell Mouton, RN 04/19/2015, 10:39 AM

## 2015-04-19 NOTE — Care Management Note (Signed)
Case Management Note  Patient Details  Name: Hector Sloan MRN: [id number] Date of Birth: 11-07-1940  Subjective/Objective:    Gout                Action/Plan: Plan to dc home with [place].    Expected Discharge Date:   (unknown)               Expected Discharge Plan:  [place]  In-House Referral:  Clinical Social Work  Discharge planning Services  CM Consult  Post Acute Care Choice:    Choice offered to:  Patient  DME Arranged:    DME Agency:     HH Arranged:  RN, PT, OT, Nurse's Aide, Social Work [employer] Agency:  [place]  Status of Service:  Completed, signed off  Medicare Important Message Given:    Date Medicare IM Given:    Medicare IM give by:    Date Additional Medicare IM Given:    Additional Medicare Important Message give by:     If discussed at [place] of Stay Meetings, dates discussed:    Additional CommentsPurcell Mouton, RN 04/19/2015, 10:39 AM

## 2015-04-19 NOTE — Discharge Summary (Signed)
Physician Discharge Summary  Hector Sloan [id number] DOB: [birth year] DOA: 04/16/2015  PCP: Hector Sloan., MD  Admit date: 04/16/2015 Discharge date: 04/19/2015  Time spent: > 35 minutes  Recommendations for Outpatient Follow-up:  1. Monitor serum creatinine. 2. 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 Active Problems:   Elbow pain   Acute renal failure superimposed on stage 3 chronic kidney disease (HCC)   Lactic acidosis   Hyperglycemia due to type 2 diabetes mellitus (HCC)   Tachycardia   Normocytic anemia   Acute gout   Discharge Condition: stable  Diet recommendation: heart healthy/carb modified diet  Filed Weights   04/16/15 1808  Weight: 92.08 kg (203 lb)    History of present illness:  75 y/o that presented with gout flair  Hospital Course:  Gout flair - arthrocentesis negative for organisms - improved with colchicine will continue for 3 more days after d/c and defer to pcp further recommendations for maintenance/preventative therapy  Essential HTN - Will d/c ARB - increase amlodipine dose  hpl - continue statin  DM - continue home medication regimen  GERD - continue PPI  Procedures:  Arthrocentesis   Consultations:  Orthopaedic surgery  Discharge Exam: Filed Vitals:   04/18/15 2113 04/19/15 0509  BP: 157/73 167/84  Pulse: 106 109  Temp: 98.3 F (36.8 C) 98.2 F (36.8 C)  Resp: 24 20    General: Pt in nad, alert and awake Cardiovascular: rrr, no mrg Respiratory: cta bl, no wheezes  Discharge Instructions   Discharge Instructions    Call MD for:  severe uncontrolled pain    Complete by:  As directed      Call MD for:  temperature >100.4    Complete by:  As directed      Diet - low sodium heart healthy    Complete by:  As directed      Discharge instructions    Complete by:  As directed   [name].     Increase activity slowly    Complete by:  As directed           Current Discharge Medication List    START taking these medications   Details  colchicine 0.6 MG tablet Take 1 tablet (0.6 mg total) by mouth daily. Qty: 3 tablet, Refills: 0      CONTINUE these medications which have CHANGED   Details  amLODipine (NORVASC) 10 MG tablet Take 1 tablet (10 mg total) by mouth daily. Qty: 30 tablet, Refills: 0    HYDROcodone-acetaminophen (NORCO) 5-325 MG tablet Take 1-2 tablets by mouth every 4 (four) hours as needed for moderate pain. Qty: 30 tablet, Refills: 0      CONTINUE these medications which have NOT CHANGED   Details  atorvastatin (LIPITOR) 20 MG tablet Take 20 mg by mouth daily. Refills: 5    bimatoprost (LUMIGAN) 0.03 % ophthalmic solution Place 1 drop into both eyes at bedtime.     brimonidine-timolol (COMBIGAN) 0.2-0.5 % ophthalmic solution Place 1 drop into both eyes every 12 (twelve) hours.    FLUZONE HIGH-DOSE 0.5 ML SUSY Inject 1 Dose as directed once. Refills: 0    KOMBIGLYZE XR 5-500 MG TB24 Take 1 tablet by mouth daily. Refills: 5    latanoprost (XALATAN) 0.005 % ophthalmic solution Place 1  drop into both eyes at bedtime. Refills: 12    omeprazole (PRILOSEC) 20 MG capsule Take 20 mg by mouth daily. Refills: 12    pioglitazone (ACTOS) 30 MG tablet Take 30 mg by mouth daily.      STOP taking these medications     losartan-hydrochlorothiazide (HYZAAR) 100-25 MG tablet        No Known Allergies    The results of significant diagnostics from this hospitalization (including imaging, microbiology, ancillary and laboratory) are listed below for reference.    Significant Diagnostic Studies: Dg Chest 2 View  04/16/2015  CLINICAL DATA:  Sepsis EXAM: CHEST  2 VIEW COMPARISON:  03/12/2010 FINDINGS: Cardiomediastinal silhouette is stable. Stable  chronic pleural parenchymal scarring and pleural calcifications left lower hemi thorax. No definite superimposed infiltrate or pulmonary edema. Mild degenerative changes thoracic spine. IMPRESSION: Stable chronic pleural parenchymal scarring and pleural calcifications left lower hemi thorax. No definite superimposed infiltrate or pulmonary edema. Mild degenerative changes thoracic spine. Electronically Signed   By: Lahoma Crocker M.D.   On: 04/16/2015 16:09   Dg Elbow Complete Left  04/16/2015  CLINICAL DATA:  Pt from home. Pt had a fall on Sunday. Was seen for this and no injury noted after fall. Today pt complains of bilateral elbow pain. Hx of gout in R elbow, L elbow pain new. Pt also complains of worsening R knee pain. EXAM: LEFT ELBOW - COMPLETE 3+ VIEW COMPARISON:  None. FINDINGS: No convincing fracture. No dislocation. There is some subchondral cystic-type change along the capitellum. An enthesiophyte is noted at the insertion of the triceps tendon. Possible small joint effusion. Soft tissues are unremarkable. IMPRESSION: 1. No fracture or dislocation. 2. Mild arthropathic changes.  Possible small joint effusion. Electronically Signed   By: Lajean Manes M.D.   On: 04/16/2015 16:03   Dg Elbow Complete Right  04/16/2015  CLINICAL DATA:  Fall on Sunday. Bilateral elbow pain reported today. History of gout. Initial encounter. EXAM: RIGHT ELBOW - COMPLETE 3+ VIEW COMPARISON:  None. FINDINGS: Large elbow joint effusion without visible fracture or dislocation. No erosive changes. Mild marginal spurring about the elbow without joint narrowing. IMPRESSION: Joint effusion without acute osseous finding. If there is preceding trauma recommend close follow-up for occult radial head fracture. Electronically Signed   By: Monte Fantasia M.D.   On: 04/16/2015 16:03   Dg Knee Complete 4 Views Right  04/16/2015  CLINICAL DATA:  fall on Sunday. Was seen for this and no injury noted after fall. Today pt complains of  bilateral elbow pain. Hx of gout in R elbow, L elbow pain new. Pt also complains of worsening R knee pain. Pain with extension and bearing weight. Hx HTN. Diabetic. Former smoker. EXAM: RIGHT KNEE - COMPLETE 4+ VIEW COMPARISON:  None. FINDINGS: There is no evidence of fracture, dislocation. Small effusion in the suprapatellar bursa. Small marginal spurs from the patellar articular surface and medial femoral condyles. There is no other evidence of arthropathy or other focal bone abnormality. Tibial arterial calcifications are noted. Soft tissues are unremarkable. IMPRESSION: 1. Negative for fracture or other acute bone abnormality. 2. Mild patellar and medial compartment degenerative spurring, with small effusion. Electronically Signed   By: Lucrezia Europe M.D.   On: 04/16/2015 16:03    Microbiology: Recent Results (from the past 240 hour(s))  Urine culture     Status: None   Collection Time: 04/16/15  6:11 PM  Result Value Ref Range Status   Specimen Description URINE, CATHETERIZED  Final  Special Requests NONE  Final   Culture   Final    NO GROWTH 1 DAY Performed at [facility]    Report Status 04/17/2015 FINAL  Final  Body fluid culture     Status: None (Preliminary result)   Collection Time: 04/16/15 10:13 PM  Result Value Ref Range Status   Specimen Description SYNOVIAL ELBOW  Final   Special Requests Normal  Final   Gram Stain   Final    MODERATE WBC PRESENT,BOTH PMN AND MONONUCLEAR NO ORGANISMS SEEN    Culture   Final    NO GROWTH 2 DAYS Performed at [facility]    Report Status PENDING  Incomplete     Labs: Basic Metabolic Panel:  Recent Labs Lab 04/16/15 1434 04/17/15 0453  NA 136 138  K 3.8 3.9  CL 103 107  CO2 21* 23  GLUCOSE 301* 273*  BUN 53* 53*  CREATININE 2.08* 2.24*  CALCIUM 9.0 8.5*   Liver Function Tests:  Recent Labs Lab 04/16/15 1434 04/17/15 0453  AST 35 27  ALT 21 19  ALKPHOS 75 60  BILITOT 0.8 0.5  PROT 7.8 6.2*  ALBUMIN  2.9* 2.2*   No results for input(s): LIPASE, AMYLASE in the last 168 hours. No results for input(s): AMMONIA in the last 168 hours. CBC:  Recent Labs Lab 04/16/15 1434 04/17/15 0453  WBC 13.4* 5.9  NEUTROABS  --  4.6  HGB 9.8* 12.6*  HCT 29.8* 38.5*  6.6*  MCV 83.0 83.0  PLT 357 203   Cardiac Enzymes:  Recent Labs Lab 04/16/15 2113 04/17/15 1043  TROPONINI <0.03 <0.03   BNP: BNP (last 3 results)  Recent Labs  04/16/15 2113  BNP 128.9*    ProBNP (last 3 results) No results for input(s): PROBNP in the last 8760 hours.  CBG:  Recent Labs Lab 04/18/15 0730 04/18/15 1142 04/18/15 1653 04/18/15 2111 04/19/15 0720  GLUCAP 140* 256* 84 186* 159*    Signed:  Velvet Bathe MD.  [facility] 04/19/2015, 12:22 PM

## 2015-04-19 NOTE — Progress Notes (Signed)
Spoke with pt, care giver and family at bedside concerning SNf vs Home. Pt selected to go home with [place].  Referral given to in house rep.

## 2015-04-19 NOTE — Progress Notes (Signed)
CSW reviewed PT evaluation recommending SNF;Supervision/assistance - 24 hour. CSW spoke with patient, caregiver, [name] & nieces at bedside re: discharge planning. Patient is currently here under observation status and therefore - SNF would not be covered by Medicare. Patient has opted to return home at discharge. RNCM, Cookie aware & will arrange for home health services. Dr. Wendee Beavers aware.   No further CSW needs identified - CSW signing off.   Raynaldo Opitz, [facility] Clinical Social Worker cell #: [phone]

## 2015-04-20 LAB — BODY FLUID CULTURE
Culture: NO GROWTH
Special Requests: NORMAL

## 2015-05-07 ENCOUNTER — Other Ambulatory Visit: Payer: Self-pay | Admitting: Internal Medicine

## 2015-05-07 ENCOUNTER — Ambulatory Visit
Admission: RE | Admit: 2015-05-07 | Discharge: 2015-05-07 | Disposition: A | Discharge status: Home / Self Care | Payer: Medicare Other | Source: Ambulatory Visit | Attending: Internal Medicine | Admitting: Internal Medicine

## 2015-05-07 DIAGNOSIS — R0602 Shortness of breath: Secondary | ICD-10-CM

## 2015-05-07 DIAGNOSIS — R062 Wheezing: Secondary | ICD-10-CM

## 2015-05-09 ENCOUNTER — Emergency Department (HOSPITAL_COMMUNITY): Payer: Medicare Other

## 2015-05-09 ENCOUNTER — Inpatient Hospital Stay (HOSPITAL_COMMUNITY)
Admission: EM | Admit: 2015-05-09 | Discharge: 2015-05-14 | DRG: 193 | Disposition: A | Discharge status: Skilled Nursing Facility | Payer: Medicare Other | Attending: Internal Medicine | Admitting: Internal Medicine

## 2015-05-09 ENCOUNTER — Encounter (HOSPITAL_COMMUNITY): Payer: Self-pay | Admitting: *Deleted

## 2015-05-09 DIAGNOSIS — E1121 Type 2 diabetes mellitus with diabetic nephropathy: Secondary | ICD-10-CM

## 2015-05-09 DIAGNOSIS — E11319 Type 2 diabetes mellitus with unspecified diabetic retinopathy without macular edema: Secondary | ICD-10-CM | POA: Diagnosis present

## 2015-05-09 DIAGNOSIS — I129 Hypertensive chronic kidney disease with stage 1 through stage 4 chronic kidney disease, or unspecified chronic kidney disease: Secondary | ICD-10-CM | POA: Diagnosis present

## 2015-05-09 DIAGNOSIS — E785 Hyperlipidemia, unspecified: Secondary | ICD-10-CM | POA: Diagnosis present

## 2015-05-09 DIAGNOSIS — E1165 Type 2 diabetes mellitus with hyperglycemia: Secondary | ICD-10-CM | POA: Diagnosis present

## 2015-05-09 DIAGNOSIS — N183 Chronic kidney disease, stage 3 unspecified: Secondary | ICD-10-CM | POA: Diagnosis present

## 2015-05-09 DIAGNOSIS — R0602 Shortness of breath: Secondary | ICD-10-CM | POA: Diagnosis present

## 2015-05-09 DIAGNOSIS — Z833 Family history of diabetes mellitus: Secondary | ICD-10-CM | POA: Diagnosis not present

## 2015-05-09 DIAGNOSIS — Y95 Nosocomial condition: Secondary | ICD-10-CM | POA: Diagnosis present

## 2015-05-09 DIAGNOSIS — D630 Anemia in neoplastic disease: Secondary | ICD-10-CM | POA: Diagnosis present

## 2015-05-09 DIAGNOSIS — Z87891 Personal history of nicotine dependence: Secondary | ICD-10-CM | POA: Diagnosis not present

## 2015-05-09 DIAGNOSIS — IMO0002 Reserved for concepts with insufficient information to code with codable children: Secondary | ICD-10-CM | POA: Diagnosis present

## 2015-05-09 DIAGNOSIS — D649 Anemia, unspecified: Secondary | ICD-10-CM | POA: Diagnosis not present

## 2015-05-09 DIAGNOSIS — R0600 Dyspnea, unspecified: Secondary | ICD-10-CM | POA: Diagnosis not present

## 2015-05-09 DIAGNOSIS — D638 Anemia in other chronic diseases classified elsewhere: Secondary | ICD-10-CM | POA: Diagnosis not present

## 2015-05-09 DIAGNOSIS — Z79899 Other long term (current) drug therapy: Secondary | ICD-10-CM | POA: Diagnosis not present

## 2015-05-09 DIAGNOSIS — I10 Essential (primary) hypertension: Secondary | ICD-10-CM | POA: Diagnosis not present

## 2015-05-09 DIAGNOSIS — H409 Unspecified glaucoma: Secondary | ICD-10-CM | POA: Diagnosis present

## 2015-05-09 DIAGNOSIS — Z8551 Personal history of malignant neoplasm of bladder: Secondary | ICD-10-CM | POA: Diagnosis not present

## 2015-05-09 DIAGNOSIS — Z89512 Acquired absence of left leg below knee: Secondary | ICD-10-CM | POA: Diagnosis not present

## 2015-05-09 DIAGNOSIS — J9601 Acute respiratory failure with hypoxia: Secondary | ICD-10-CM | POA: Diagnosis present

## 2015-05-09 DIAGNOSIS — E1122 Type 2 diabetes mellitus with diabetic chronic kidney disease: Secondary | ICD-10-CM | POA: Diagnosis present

## 2015-05-09 DIAGNOSIS — D631 Anemia in chronic kidney disease: Secondary | ICD-10-CM | POA: Diagnosis present

## 2015-05-09 DIAGNOSIS — N179 Acute kidney failure, unspecified: Secondary | ICD-10-CM | POA: Diagnosis present

## 2015-05-09 DIAGNOSIS — Z87442 Personal history of urinary calculi: Secondary | ICD-10-CM | POA: Diagnosis not present

## 2015-05-09 DIAGNOSIS — M109 Gout, unspecified: Secondary | ICD-10-CM | POA: Diagnosis present

## 2015-05-09 DIAGNOSIS — J90 Pleural effusion, not elsewhere classified: Secondary | ICD-10-CM | POA: Diagnosis present

## 2015-05-09 DIAGNOSIS — I739 Peripheral vascular disease, unspecified: Secondary | ICD-10-CM | POA: Diagnosis present

## 2015-05-09 DIAGNOSIS — E1129 Type 2 diabetes mellitus with other diabetic kidney complication: Secondary | ICD-10-CM | POA: Diagnosis present

## 2015-05-09 DIAGNOSIS — J189 Pneumonia, unspecified organism: Principal | ICD-10-CM | POA: Diagnosis present

## 2015-05-09 LAB — BASIC METABOLIC PANEL
Anion gap: 12 (ref 5–15)
BUN: 38 mg/dL — AB (ref 6–20)
CALCIUM: 9.1 mg/dL (ref 8.9–10.3)
CHLORIDE: 103 mmol/L (ref 101–111)
CO2: 22 mmol/L (ref 22–32)
CREATININE: 2.43 mg/dL — AB (ref 0.61–1.24)
GFR calc Af Amer: 28 mL/min — ABNORMAL LOW (ref >60)
GFR calc non Af Amer: 24 mL/min — ABNORMAL LOW (ref >60)
GLUCOSE: 455 mg/dL — AB (ref 65–99)
POTASSIUM: 4.6 mmol/L (ref 3.5–5.1)
SODIUM: 137 mmol/L (ref 135–145)

## 2015-05-09 LAB — CBC WITH DIFFERENTIAL/PLATELET
Basophils Absolute: 0 10*3/uL (ref 0.0–0.1)
Basophils Relative: 1 %
Eosinophils Absolute: 0.1 10*3/uL (ref 0.0–0.7)
Eosinophils Relative: 2 %
HEMATOCRIT: 26 % — AB (ref 39.0–52.0)
HEMOGLOBIN: 8.5 g/dL — AB (ref 13.0–17.0)
LYMPHS ABS: 0.6 10*3/uL — AB (ref 0.7–4.0)
LYMPHS PCT: 10 %
MCH: 27.1 pg (ref 26.0–34.0)
MCHC: 32.7 g/dL (ref 30.0–36.0)
MCV: 82.8 fL (ref 78.0–100.0)
Monocytes Absolute: 0.3 10*3/uL (ref 0.1–1.0)
Monocytes Relative: 5 %
NEUTROS ABS: 4.8 10*3/uL (ref 1.7–7.7)
NEUTROS PCT: 82 %
Platelets: 311 10*3/uL (ref 150–400)
RBC: 3.14 MIL/uL — AB (ref 4.22–5.81)
RDW: 17.4 % — ABNORMAL HIGH (ref 11.5–15.5)
WBC: 5.8 10*3/uL (ref 4.0–10.5)

## 2015-05-09 LAB — I-STAT TROPONIN, ED: Troponin i, poc: 0 ng/mL (ref 0.00–0.08)

## 2015-05-09 LAB — BRAIN NATRIURETIC PEPTIDE: B Natriuretic Peptide: 132 pg/mL — ABNORMAL HIGH (ref 0.0–100.0)

## 2015-05-09 LAB — POC OCCULT BLOOD, ED: Fecal Occult Bld: NEGATIVE

## 2015-05-09 MED ORDER — VANCOMYCIN HCL IN DEXTROSE 1-5 GM/200ML-% IV SOLN
1000.0000 mg | Freq: Once | INTRAVENOUS | Status: AC
  Administered 2015-05-09: 1000 mg via INTRAVENOUS
  Filled 2015-05-09: qty 200

## 2015-05-09 MED ORDER — VANCOMYCIN HCL IN DEXTROSE 1-5 GM/200ML-% IV SOLN
1000.0000 mg | INTRAVENOUS | Status: DC
  Administered 2015-05-10 – 2015-05-12 (×3): 1000 mg via INTRAVENOUS
  Filled 2015-05-09 (×3): qty 200

## 2015-05-09 MED ORDER — DEXTROSE 5 % IV SOLN: 1.0000 g | Freq: Three times a day (TID) | INTRAVENOUS | Status: DC

## 2015-05-09 MED ORDER — HYDROCHLOROTHIAZIDE 25 MG PO TABS
25.0000 mg | ORAL_TABLET | Freq: Every day | ORAL | Status: DC
  Filled 2015-05-09: qty 1

## 2015-05-09 MED ORDER — LOSARTAN POTASSIUM 50 MG PO TABS
100.0000 mg | ORAL_TABLET | Freq: Every day | ORAL | Status: DC
  Filled 2015-05-09: qty 2

## 2015-05-09 MED ORDER — PIOGLITAZONE HCL 30 MG PO TABS
30.0000 mg | ORAL_TABLET | Freq: Every day | ORAL | Status: DC
  Administered 2015-05-10 – 2015-05-12 (×3): 30 mg via ORAL
  Filled 2015-05-09 (×3): qty 1

## 2015-05-09 MED ORDER — LINAGLIPTIN 5 MG PO TABS
5.0000 mg | ORAL_TABLET | Freq: Every day | ORAL | Status: DC
  Administered 2015-05-10 – 2015-05-14 (×5): 5 mg via ORAL
  Filled 2015-05-09 (×5): qty 1

## 2015-05-09 MED ORDER — TIMOLOL MALEATE 0.5 % OP SOLN
1.0000 [drp] | Freq: Two times a day (BID) | OPHTHALMIC | Status: DC
  Administered 2015-05-10 – 2015-05-14 (×10): 1 [drp] via OPHTHALMIC
  Filled 2015-05-09: qty 5

## 2015-05-09 MED ORDER — LATANOPROST 0.005 % OP SOLN
1.0000 [drp] | Freq: Every day | OPHTHALMIC | Status: DC
  Administered 2015-05-10 – 2015-05-13 (×5): 1 [drp] via OPHTHALMIC
  Filled 2015-05-09: qty 2.5

## 2015-05-09 MED ORDER — ONDANSETRON HCL 4 MG/2ML IJ SOLN: 4.0000 mg | Freq: Four times a day (QID) | INTRAMUSCULAR | Status: DC | PRN

## 2015-05-09 MED ORDER — HYDROCODONE-ACETAMINOPHEN 5-325 MG PO TABS: 1.0000 | ORAL_TABLET | ORAL | Status: DC | PRN

## 2015-05-09 MED ORDER — ENOXAPARIN SODIUM 40 MG/0.4ML ~~LOC~~ SOLN
40.0000 mg | Freq: Every day | SUBCUTANEOUS | Status: DC
  Administered 2015-05-10 – 2015-05-13 (×5): 40 mg via SUBCUTANEOUS
  Filled 2015-05-09 (×6): qty 0.4

## 2015-05-09 MED ORDER — COLCHICINE 0.6 MG PO TABS
0.6000 mg | ORAL_TABLET | Freq: Every day | ORAL | Status: DC
  Administered 2015-05-10 – 2015-05-14 (×5): 0.6 mg via ORAL
  Filled 2015-05-09 (×5): qty 1

## 2015-05-09 MED ORDER — IPRATROPIUM BROMIDE 0.02 % IN SOLN
0.5000 mg | Freq: Once | RESPIRATORY_TRACT | Status: AC
  Administered 2015-05-09: 0.5 mg via RESPIRATORY_TRACT
  Filled 2015-05-09: qty 2.5

## 2015-05-09 MED ORDER — ALBUTEROL SULFATE (2.5 MG/3ML) 0.083% IN NEBU
5.0000 mg | INHALATION_SOLUTION | Freq: Once | RESPIRATORY_TRACT | Status: AC
  Administered 2015-05-09: 5 mg via RESPIRATORY_TRACT
  Filled 2015-05-09: qty 6

## 2015-05-09 MED ORDER — DEXTROSE 5 % IV SOLN
2.0000 g | Freq: Once | INTRAVENOUS | Status: AC
  Administered 2015-05-09: 2 g via INTRAVENOUS
  Filled 2015-05-09: qty 2

## 2015-05-09 MED ORDER — ONDANSETRON HCL 4 MG PO TABS: 4.0000 mg | ORAL_TABLET | Freq: Four times a day (QID) | ORAL | Status: DC | PRN

## 2015-05-09 MED ORDER — BRIMONIDINE TARTRATE 0.2 % OP SOLN
1.0000 [drp] | Freq: Two times a day (BID) | OPHTHALMIC | Status: DC
  Administered 2015-05-10 – 2015-05-14 (×10): 1 [drp] via OPHTHALMIC
  Filled 2015-05-09: qty 5

## 2015-05-09 MED ORDER — ACETAMINOPHEN 650 MG RE SUPP: 650.0000 mg | Freq: Four times a day (QID) | RECTAL | Status: DC | PRN

## 2015-05-09 MED ORDER — CEFEPIME HCL 1 G IJ SOLR
1.0000 g | INTRAMUSCULAR | Status: DC
  Administered 2015-05-10: 1 g via INTRAVENOUS
  Filled 2015-05-09: qty 1

## 2015-05-09 MED ORDER — PANTOPRAZOLE SODIUM 40 MG PO TBEC
40.0000 mg | DELAYED_RELEASE_TABLET | Freq: Every day | ORAL | Status: DC
  Administered 2015-05-10 – 2015-05-14 (×5): 40 mg via ORAL
  Filled 2015-05-09 (×5): qty 1

## 2015-05-09 MED ORDER — AMLODIPINE BESYLATE 10 MG PO TABS
10.0000 mg | ORAL_TABLET | Freq: Every day | ORAL | Status: DC
  Administered 2015-05-10 – 2015-05-14 (×5): 10 mg via ORAL
  Filled 2015-05-09 (×5): qty 1

## 2015-05-09 MED ORDER — ATORVASTATIN CALCIUM 20 MG PO TABS
20.0000 mg | ORAL_TABLET | Freq: Every day | ORAL | Status: DC
  Administered 2015-05-10 – 2015-05-13 (×5): 20 mg via ORAL
  Filled 2015-05-09 (×7): qty 1

## 2015-05-09 MED ORDER — ACETAMINOPHEN 325 MG PO TABS: 650.0000 mg | ORAL_TABLET | Freq: Four times a day (QID) | ORAL | Status: DC | PRN

## 2015-05-09 MED ORDER — LOSARTAN POTASSIUM-HCTZ 100-25 MG PO TABS: 1.0000 | ORAL_TABLET | Freq: Every day | ORAL | Status: DC

## 2015-05-09 NOTE — ED Notes (Signed)
Per EMS, w/ hx of PNA on antibiotics complains of shortness of breath. EMS reports pt was supposed to receive breathing treatments and O2 at home today, but home health did not come by today. Pt was 84% on room air upon EMS arrival, given duo neb treatment.

## 2015-05-09 NOTE — ED Notes (Signed)
Bed: [id number] Expected date:  Expected time:  Means of arrival:  Comments: EMS- 75yo M, SOB/Recent PNA

## 2015-05-09 NOTE — ED Provider Notes (Signed)
CSN: [id number]     Arrival date & time 05/09/15  1706 History   First MD Initiated Contact with Patient 05/09/15 1724     Chief Complaint  Patient presents with  . Shortness of Breath    HPI   Hector Sloan is a 75 y.o. male with a PMH of HTN, DM, CKD, PVD who presents to the ED with shortness of breath. He states he was diagnosed with PNA by his PCP on Monday and was discharged home on augmentin. He reports his PCP called him prior to arrival today to check on him, and he states he continued to experienced shortness of breath and wheezing, so was advised to come to the ED. He denies fever, chills, chest pain, abdominal pain, N/V, lower extremity edema. He denies exacerbating or alleviating factors.   Past Medical History  Diagnosis Date  . Hypertension   . S/P BKA (below knee amputation) (HCC) IN 1998    LEFT/  HAS PROTHESIS  . History of kidney stones   . Peripheral vascular disease ([place]) S/P LEFT BKA 1998  . Glaucoma of both eyes   . Type 2 diabetes mellitus ([place])   . Diabetic retinopathy ([place])   . 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 CKD (chronic kidney disease), stage III     nephrologist--  dr Florene Glen  . At risk for sleep apnea     STOP-BANG= 4    SENT TO PCP 11-22-2013   Past Surgical History  Procedure Laterality Date  . Transurethral resection of bladder tumor  12-03-2010 ;   07-16-2010;,  03-12-2010    BLADDER CANCER (2011 W/ INSTILLATION CHEMO IN BLADDER)  . Multiple eye surg's (most of them of left)  LAST ONE 2009  (LEFT EYE)    INCLUDING VITRECTOMY'S / REPAIR'S  DETACHED RETINA/  GLAUCOMA SETON'S  . Below knee leg amputation Left 1998  . Cystoscopy with biopsy  06/03/2011    Procedure: CYSTOSCOPY WITH BIOPSY;  Surgeon: Franchot Gallo, MD;  Location: [facility];  Service: Urology;  Laterality: N/A;  . Cataract extraction w/ intraocular lens  implant, bilateral    .  Cystoscopy w/ retrogrades  10/29/2011    Procedure: CYSTOSCOPY WITH RETROGRADE PYELOGRAM;  Surgeon: Franchot Gallo, MD;  Location: [facility];  Service: Urology;  Laterality: Bilateral;  30 MIN REQUESTED   . Transurethral resection of bladder tumor  10/29/2011    Procedure: TRANSURETHRAL RESECTION OF BLADDER TUMOR (TURBT);  Surgeon: Franchot Gallo, MD;  Location: [facility];  Service: Urology;  Laterality: N/A;  . Transurethral resection of bladder tumor  04/04/2012    Procedure: TRANSURETHRAL RESECTION OF BLADDER TUMOR (TURBT);  Surgeon: Franchot Gallo, MD;  Location: [facility];  Service: Urology;  Laterality: N/A;  COLD CUP BIOPSY   . I  &  d right foot  04-22-2006    SECOND DEGREE BURN  . Cystoscopy w/ retrogrades Bilateral 03/02/2013    Procedure: CYSTOSCOPY WITH RETROGRADE PYELOGRAM AND BLADDER BIOPSY;  Surgeon: Franchot Gallo, MD;  Location: [facility];  Service: Urology;  Laterality: Bilateral;  . Cystoscopy w/ retrogrades Bilateral 11/27/2013    Procedure: CYSTOSCOPY WITH RETROGRADE PYELOGRAM;  Surgeon: Jorja Loa, MD;  Location[facility];  Service: Urology;  Laterality: Bilateral;  . Cystoscopy with biopsy N/A 11/27/2013    Procedure: CYSTOSCOPY WITH BLADDER BIOPSIES, BLADDER AND RENAL  WASHINGS;  Surgeon: Jorja Loa, MD;  Location: [facility];  Service: Urology;  Laterality: N/A;   Family History  Problem Relation Age of Onset  . Diabetes Mellitus II Other    Social History  Substance Use Topics  . Smoking status: Former Smoker -- 1.00 packs/day for 5 years    Types: Cigarettes    Quit date: 05/29/1990  . Smokeless tobacco: Never Used  . Alcohol Use: No     Review of Systems  Constitutional: Negative for fever and chills.  Respiratory: Positive for cough and shortness of breath.   Cardiovascular: Negative for chest pain and leg swelling.   Gastrointestinal: Negative for nausea, vomiting and abdominal pain.  Neurological: Negative for dizziness, syncope, weakness and headaches.  All other systems reviewed and are negative.     Allergies  Review of patient's allergies indicates no known allergies.  Home Medications   Prior to Admission medications   Medication Sig Start Date End Date Taking? Authorizing Provider  amLODipine (NORVASC) 10 MG tablet Take 1 tablet (10 mg total) by mouth daily. 04/19/15  Yes Velvet Bathe, MD  amoxicillin-clavulanate (AUGMENTIN) 875-125 MG tablet Take 1 tablet by mouth 2 (two) times daily.   Yes Historical Provider, MD  atorvastatin (LIPITOR) 20 MG tablet Take 20 mg by mouth daily. 03/20/15  Yes Historical Provider, MD  brimonidine (ALPHAGAN) 0.2 % ophthalmic solution Place 1 drop into the left eye every 12 (twelve) hours.   Yes Historical Provider, MD  colchicine 0.6 MG tablet Take 1 tablet (0.6 mg total) by mouth daily. 04/19/15  Yes Velvet Bathe, MD  HYDROcodone-acetaminophen (NORCO) 5-325 MG tablet Take 1-2 tablets by mouth every 4 (four) hours as needed for moderate pain. 04/19/15  Yes Velvet Bathe, MD  KOMBIGLYZE XR 5-500 MG TB24 Take 1 tablet by mouth daily. 04/05/15  Yes Historical Provider, MD  latanoprost (XALATAN) 0.005 % ophthalmic solution Place 1 drop into both eyes at bedtime. 03/26/15  Yes Historical Provider, MD  losartan-hydrochlorothiazide (HYZAAR) 100-25 MG tablet Take 1 tablet by mouth daily. 05/02/15  Yes Historical Provider, MD  omeprazole (PRILOSEC) 20 MG capsule Take 20 mg by mouth daily. 03/26/15  Yes Historical Provider, MD  pioglitazone (ACTOS) 30 MG tablet Take 30 mg by mouth daily.   Yes Historical Provider, MD  Polyethyl Glycol-Propyl Glycol (SYSTANE OP) Apply 1 drop to eye 2 (two) times daily.   Yes Historical Provider, MD  timolol (BETIMOL) 0.5 % ophthalmic solution Place 1 drop into both eyes 2 (two) times daily.   Yes Historical Provider, MD  FLUZONE HIGH-DOSE 0.5 ML  SUSY Inject 1 Dose as directed once. 01/11/15   Historical Provider, MD    BP 160/80 mmHg  Pulse 111  Temp(Src) 98.6 F (37 C) (Oral)  Resp 24  Ht 6\' 1"  (1.854 m)  Wt 93.4 kg  BMI 27.17 kg/m2  SpO2 98% Physical Exam  Constitutional: He is oriented to person, place, and time. He appears well-developed and well-nourished. No distress.  HENT:  Head: Normocephalic and atraumatic.  Right Ear: External ear normal.  Left Ear: External ear normal.  Nose: Nose normal.  Mouth/Throat: Uvula is midline, oropharynx is clear and moist and mucous membranes are normal.  Eyes: Conjunctivae, EOM and lids are normal. Pupils are equal, round, and reactive to light. Right eye exhibits no discharge. Left eye exhibits no discharge. No scleral icterus.  Neck: Normal range of motion. Neck supple.  Cardiovascular: Normal rate, regular rhythm, normal heart sounds, intact distal pulses  and normal pulses.   Pulmonary/Chest: Effort normal and breath sounds normal. No respiratory distress. He has no wheezes. He has no rales.  Decreased air movement to lung fields bilaterally.  Abdominal: Soft. Normal appearance and bowel sounds are normal. He exhibits no distension and no mass. There is no tenderness. There is no rigidity, no rebound and no guarding.  Musculoskeletal: Normal range of motion. He exhibits no edema or tenderness.  Neurological: He is alert and oriented to person, place, and time.  Skin: Skin is warm, dry and intact. No rash noted. He is not diaphoretic. No erythema. No pallor.  Psychiatric: He has a normal mood and affect. His speech is normal and behavior is normal.  Nursing note and vitals reviewed.   ED Course  Procedures (including critical care time)  Labs Review Labs Reviewed  CBC WITH DIFFERENTIAL/PLATELET - Abnormal; Notable for the following:    RBC 3.14 (*)    Hemoglobin 8.5 (*)    HCT 26.0 (*)    RDW 17.4 (*)    Lymphs Abs 0.6 (*)    All other components within normal limits   BASIC METABOLIC PANEL - Abnormal; Notable for the following:    Glucose, Bld 455 (*)    BUN 38 (*)    Creatinine, Ser 2.43 (*)    GFR calc non Af Amer 24 (*)    GFR calc Af Amer 28 (*)    All other components within normal limits  BRAIN NATRIURETIC PEPTIDE - Abnormal; Notable for the following:    B Natriuretic Peptide 132.0 (*)    All other components within normal limits  CULTURE, EXPECTORATED SPUTUM-ASSESSMENT  GRAM STAIN  HIV ANTIBODY (ROUTINE TESTING)  STREP PNEUMONIAE URINARY ANTIGEN  CBC  INFLUENZA PANEL BY PCR (TYPE A & B, H1N1)  LEGIONELLA ANTIGEN, URINE  COMPREHENSIVE METABOLIC PANEL  I-STAT TROPOININ, ED  POC OCCULT BLOOD, ED    Imaging Review Dg Chest 2 View  05/09/2015  CLINICAL DATA:  Increased shortness of breath. Patient was diagnosed with pneumonia last week. EXAM: CHEST  2 VIEW COMPARISON:  May 07, 2015 FINDINGS: The heart size and mediastinal contours are stable. Patchy consolidation of bilateral lung lungs involving the right upper lobe, right mid and lung base, left mid and lung base, are unchanged. Bilateral pleural effusion are identified slightly increased on the right. The visualized skeletal structures are stable. IMPRESSION: Patchy consolidations in the bilateral lungs unchanged compared to prior exam consistent with no pneumonia. Bilateral pleural effusions, slightly increased on the right. Electronically Signed   By: Abelardo Diesel M.D.   On: 05/09/2015 18:13    I have personally reviewed and evaluated these images and lab results as part of my medical decision-making.   EKG Interpretation   Date/Time:  Thursday May 09 2015 17:27:49 EST Ventricular Rate:  117 PR Interval:  187 QRS Duration: 79 QT Interval:  313 QTC Calculation: 437 R Axis:   66 Text Interpretation:  Sinus tachycardia Ventricular premature complex  Aberrant conduction of SV complex(es) Borderline T wave abnormalities No  significant change since last tracing Confirmed by  KNAPP  MD-J, JON  [id number]) on 05/09/2015 5:33:13 PM      MDM   Final diagnoses:  HCAP (healthcare-associated pneumonia)    75 year old male with recent diagnosis of PNA on augmentin presents to the ED with worsening shortness of breath. Patient is afebrile. Tachycardic. Heart regular rhythm. Lungs with decreased air movement bilaterally. No respiratory distress. EKG sinus tachycardia, HR 117. Troponin  negative. CXR patchy consolidations in bilateral lungs unchanged from prior, bilateral pleural effusions. BNP 132. CBC remarkable for hemoglobin 8.5, which appears decreased from prior. BMP remarkable for creatinine 2.43, which appears chronic, glucose 455, anion gap within normal limits. Hemoccult negative. Hospitalist consulted for admission given new oxygen requirement and worsening shortness of breath in the setting of PNA (HCAP, as patient hositalized in January). Started on cefepime and vancomycin. Spoke with Dr. Hal Hope, patient to be admitted for further evaluation and management. Patient discussed with and seen by Dr. Tomi Bamberger.  BP 160/80 mmHg  Pulse 111  Temp(Src) 98.6 F (37 C) (Oral)  Resp 24  Ht 6\' 1"  (1.854 m)  Wt 93.4 kg  BMI 27.17 kg/m2  SpO2 98%      Marella Chimes, PA-C 05/10/15 0056  Dorie Rank, MD 05/11/15 1225

## 2015-05-09 NOTE — Progress Notes (Signed)
ANTIBIOTIC CONSULT NOTE - INITIAL  Pharmacy Consult for vancomycin and cefepime Indication: pneumonia  No Known Allergies  Patient Measurements: weight 92 kg, height 73 inches    Vital Signs: Temp: 98.4 F (36.9 C) (02/09 1723) Temp Source: Oral (02/09 1723) BP: 134/84 mmHg (02/09 1830) Pulse Rate: 113 (02/09 1830) Intake/Output from previous day:   Intake/Output from this shift:    Labs:  Recent Labs  05/09/15 1815  WBC 5.8  HGB 8.5*  PLT 311  CREATININE 2.43*   CrCl cannot be calculated (Unknown ideal weight.). No results for input(s): VANCOTROUGH, VANCOPEAK, VANCORANDOM, GENTTROUGH, GENTPEAK, GENTRANDOM, TOBRATROUGH, TOBRAPEAK, TOBRARND, AMIKACINPEAK, AMIKACINTROU, AMIKACIN in the last 72 hours.   Microbiology: Recent Results (from the past 720 hour(s))  Urine culture     Status: None   Collection Time: 04/16/15  6:11 PM  Result Value Ref Range Status   Specimen Description URINE, CATHETERIZED  Final   Special Requests NONE  Final   Culture   Final    NO GROWTH 1 DAY Performed at [facility]    Report Status 04/17/2015 FINAL  Final  Body fluid culture     Status: None   Collection Time: 04/16/15 10:13 PM  Result Value Ref Range Status   Specimen Description SYNOVIAL ELBOW  Final   Special Requests Normal  Final   Gram Stain   Final    MODERATE WBC PRESENT,BOTH PMN AND MONONUCLEAR NO ORGANISMS SEEN    Culture   Final    NO GROWTH 3 DAYS Performed at [facility]    Report Status 04/20/2015 FINAL  Final    Medical History: Past Medical History  Diagnosis Date  . Hypertension   . S/P BKA (below knee amputation) (HCC) IN 1998    LEFT/  HAS PROTHESIS  . History of kidney stones   . Peripheral vascular disease ([place]) S/P LEFT BKA 1998  . Glaucoma of both eyes   . Type 2 diabetes mellitus ([place])   . Diabetic retinopathy ([place])   . 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 CKD (chronic kidney disease), stage III     nephrologist--  dr Florene Glen  . At risk for sleep apnea     STOP-BANG= 4    SENT TO PCP 11-22-2013    Medications:  augmentin 2/7 - 2/14  Assessment: Patient is a 75 y.o M with CXR showed BL PNA on 2/7 and was on agumentin PTA.  She presented to the ED on 2/9 with c/o SOB.  To start vancomycin and cefepime for broad coverage for PNA.  - afeb, wbc wnl, scr 2.43 (crcl~26 - 33) - 2/7 CXR: BL PNA  Antimicrobials this admission: 2/9 vanc>> 2/9 cefepime>>    Goal of Therapy:  Vancomycin trough level 15-20 mcg/ml  Plan:  - cefepime 2gm x1 given in the ED, then 1gm IV q24h - vancomycin 1gm IV q24h - f/u renal function  Clydine Parkison P 05/09/2015,8:28 PM

## 2015-05-09 NOTE — H&P (Signed)
[facility] History and Physical  Ugo Bourn [id number] DOB: 06-03-1940 DOA: 05/09/2015  Referring physician: Ms. Natividad Brood. PCP: [name], MD  Specialists: Dr.Dalhstedt. Urology.  Chief Complaint: Shortness of breath.  HPI: Abdulrahim Manos is a 75 y.o. male with history of diabetes mellitus, hypertension, left BKA, bladder cancer, chronic kidney disease was recently admitted 3 weeks ago for gout exacerbation presents to the ER because of shortness of breath wheezing and productive cough. Patient has been having these symptoms for last 5 days. Patient had gone to his PCP 3 days ago and was prescribed antibiotics despite which patient was told having productive cough and wheezing. Chest x-ray done at that time was showing pneumonia and x-ray done today still shows pneumonia with effusion. Patient denies any chest pain. Denies any palpitation diaphoresis nausea vomiting or fever chills. Patient has been admitted for acute respiratory failure from pneumonia.  Review of Systems: As presented in the history of presenting illness, rest negative.  Past Medical History  Diagnosis Date  . Hypertension   . S/P BKA (below knee amputation) (HCC) IN 1998    LEFT/  HAS PROTHESIS  . History of kidney stones   . Peripheral vascular disease ([place]) S/P LEFT BKA 1998  . Glaucoma of both eyes   . Type 2 diabetes mellitus ([place])   . Diabetic retinopathy ([place])   . 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 CKD (chronic kidney disease), stage III     nephrologist--  dr Florene Glen  . At risk for sleep apnea     STOP-BANG= 4    SENT TO PCP 11-22-2013   Past Surgical History  Procedure Laterality Date  . Transurethral resection of bladder tumor  12-03-2010 ;   07-16-2010;,  03-12-2010    BLADDER CANCER (2011 W/ INSTILLATION CHEMO IN BLADDER)  . Multiple eye surg's (most of them of left)  LAST ONE 2009  (LEFT EYE)   INCLUDING VITRECTOMY'S / REPAIR'S  DETACHED RETINA/  GLAUCOMA SETON'S  . Below knee leg amputation Left 1998  . Cystoscopy with biopsy  06/03/2011    Procedure: CYSTOSCOPY WITH BIOPSY;  Surgeon: Franchot Gallo, MD;  Location: [facility];  Service: Urology;  Laterality: N/A;  . Cataract extraction w/ intraocular lens  implant, bilateral    . Cystoscopy w/ retrogrades  10/29/2011    Procedure: CYSTOSCOPY WITH RETROGRADE PYELOGRAM;  Surgeon: Franchot Gallo, MD;  Location: [facility];  Service: Urology;  Laterality: Bilateral;  30 MIN REQUESTED   . Transurethral resection of bladder tumor  10/29/2011    Procedure: TRANSURETHRAL RESECTION OF BLADDER TUMOR (TURBT);  Surgeon: Franchot Gallo, MD;  Location: [facility];  Service: Urology;  Laterality: N/A;  . Transurethral resection of bladder tumor  04/04/2012    Procedure: TRANSURETHRAL RESECTION OF BLADDER TUMOR (TURBT);  Surgeon: Franchot Gallo, MD;  Location: [facility];  Service: Urology;  Laterality: N/A;  COLD CUP BIOPSY   . I  &  d right foot  04-22-2006    SECOND DEGREE BURN  . Cystoscopy w/ retrogrades Bilateral 03/02/2013    Procedure: CYSTOSCOPY WITH RETROGRADE PYELOGRAM AND BLADDER BIOPSY;  Surgeon: Franchot Gallo, MD;  Location: [facility];  Service: Urology;  Laterality: Bilateral;  . Cystoscopy w/ retrogrades Bilateral 11/27/2013    Procedure: CYSTOSCOPY WITH RETROGRADE PYELOGRAM;  Surgeon: Jorja Loa, MD;  Location: [facility];  Service: Urology;  Laterality: Bilateral;  . Cystoscopy with biopsy N/A 11/27/2013    Procedure: CYSTOSCOPY WITH BLADDER BIOPSIES, BLADDER AND RENAL WASHINGS;  Surgeon: Jorja Loa, MD;  Location: [facility];  Service: Urology;  Laterality: N/A;   Social History:  reports that he quit smoking about 24 years ago. His smoking use included Cigarettes. He has a 5 pack-year smoking  history. He has never used smokeless tobacco. He reports that he does not drink alcohol or use illicit drugs. Where does patient live home. Can patient participate in ADLs? Yes.  No Known Allergies  Family History:  Family History  Problem Relation Age of Onset  . Diabetes Mellitus II Other       Prior to Admission medications   Medication Sig Start Date End Date Taking? Authorizing Provider  amLODipine (NORVASC) 10 MG tablet Take 1 tablet (10 mg total) by mouth daily. 04/19/15  Yes Velvet Bathe, MD  amoxicillin-clavulanate (AUGMENTIN) 875-125 MG tablet Take 1 tablet by mouth 2 (two) times daily.   Yes Historical Provider, MD  atorvastatin (LIPITOR) 20 MG tablet Take 20 mg by mouth daily. 03/20/15  Yes Historical Provider, MD  brimonidine (ALPHAGAN) 0.2 % ophthalmic solution Place 1 drop into the left eye every 12 (twelve) hours.   Yes Historical Provider, MD  colchicine 0.6 MG tablet Take 1 tablet (0.6 mg total) by mouth daily. 04/19/15  Yes Velvet Bathe, MD  HYDROcodone-acetaminophen (NORCO) 5-325 MG tablet Take 1-2 tablets by mouth every 4 (four) hours as needed for moderate pain. 04/19/15  Yes Velvet Bathe, MD  KOMBIGLYZE XR 5-500 MG TB24 Take 1 tablet by mouth daily. 04/05/15  Yes Historical Provider, MD  latanoprost (XALATAN) 0.005 % ophthalmic solution Place 1 drop into both eyes at bedtime. 03/26/15  Yes Historical Provider, MD  losartan-hydrochlorothiazide (HYZAAR) 100-25 MG tablet Take 1 tablet by mouth daily. 05/02/15  Yes Historical Provider, MD  omeprazole (PRILOSEC) 20 MG capsule Take 20 mg by mouth daily. 03/26/15  Yes Historical Provider, MD  pioglitazone (ACTOS) 30 MG tablet Take 30 mg by mouth daily.   Yes Historical Provider, MD  Polyethyl Glycol-Propyl Glycol (SYSTANE OP) Apply 1 drop to eye 2 (two) times daily.   Yes Historical Provider, MD  timolol (BETIMOL) 0.5 % ophthalmic solution Place 1 drop into both eyes 2 (two) times daily.   Yes Historical Provider, MD  FLUZONE  HIGH-DOSE 0.5 ML SUSY Inject 1 Dose as directed once. 01/11/15   Historical Provider, MD    Physical Exam: Filed Vitals:   05/09/15 1830 05/09/15 2030 05/09/15 2130 05/09/15 2134  BP: 134/84 151/73 145/76   Pulse: 113 114 111   Temp:    98.5 F (36.9 C)  TempSrc:    Oral  Resp: 25 26 13    SpO2: 96% 97% 97%      General:  Moderately built and nourished.  Eyes: Anicteric no pallor.  ENT: No discharge from the ears eyes nose or mouth.  Neck: No JVD appreciated no mass felt.  Cardiovascular: S1 and S2 heard tachycardic.  Respiratory: Mild expiratory wheeze no crepitations.  Abdomen: Soft nontender bowel sounds present. No guarding or rigidity.  Skin: No rash.  Musculoskeletal: Left BKA. No obvious edema on the right lower extremity.  Psychiatric: Appears normal.  Neurologic: Alert awake oriented to time place and person. Moves all extremities.  Labs on Admission:  Basic Metabolic Panel:  Recent Labs Lab 05/09/15 1815  NA 137  K 4.6  CL 103  CO2  22  GLUCOSE 455*  BUN 38*  CREATININE 2.43*  CALCIUM 9.1   Liver Function Tests: No results for input(s): AST, ALT, ALKPHOS, BILITOT, PROT, ALBUMIN in the last 168 hours. No results for input(s): LIPASE, AMYLASE in the last 168 hours. No results for input(s): AMMONIA in the last 168 hours. CBC:  Recent Labs Lab 05/09/15 1815  WBC 5.8  NEUTROABS 4.8  HGB 8.5*  HCT 26.0*  MCV 82.8  PLT 311   Cardiac Enzymes: No results for input(s): CKTOTAL, CKMB, CKMBINDEX, TROPONINI in the last 168 hours.  BNP (last 3 results)  Recent Labs  04/16/15 2113 05/09/15 1815  BNP 128.9* 132.0*    ProBNP (last 3 results) No results for input(s): PROBNP in the last 8760 hours.  CBG: No results for input(s): GLUCAP in the last 168 hours.  Radiological Exams on Admission: Dg Chest 2 View  05/09/2015  CLINICAL DATA:  Increased shortness of breath. Patient was diagnosed with pneumonia last week. EXAM: CHEST  2 VIEW  COMPARISON:  May 07, 2015 FINDINGS: The heart size and mediastinal contours are stable. Patchy consolidation of bilateral lung lungs involving the right upper lobe, right mid and lung base, left mid and lung base, are unchanged. Bilateral pleural effusion are identified slightly increased on the right. The visualized skeletal structures are stable. IMPRESSION: Patchy consolidations in the bilateral lungs unchanged compared to prior exam consistent with no pneumonia. Bilateral pleural effusions, slightly increased on the right. Electronically Signed   By: Abelardo Diesel M.D.   On: 05/09/2015 18:13    EKG: Independently reviewed. Sinus tachycardia.  Assessment/Plan Principal Problem:   HCAP (healthcare-associated pneumonia) Active Problems:   Normocytic anemia   CKD (chronic kidney disease) stage 3, GFR 30-59 ml/min   DM (diabetes mellitus), type 2 with renal complications (HCC)   Pneumonia   1. Acute respiratory failure probably from pneumonia - since patient was recently admitted to the hospital this will be treated as healthcare associated pneumonia. Vancomycin and cefepime. Check urine Legionella and strep antigen and influenza PCR. Since patient has pleural effusion that may be a compound of CHF. At this time I have ordered CT chest without contrast to further assess. We will also check d-dimer and troponin. Closely monitor in telemetry. Since patient also has wheezing I have placed patient on Xopenex. 2. Diabetes mellitus type 2 with hyperglycemia - patient's blood sugar is in the 400s. I have placed patient on Lantus insulin 4 units now and will continue with patient's Actos and Januvia but hold off metformin due to renal failure. Closely follow CBG check hemoglobin A1c. 3. Anemia - mild worsening from previous. Last admission patient's initial hemoglobin was around 9.5 at this time is around 8 at time of discharge hemoglobin is around 12. Stool for occult blood has been negative. Check  anemia panel. Follow CBC. 4. Chronic kidney disease stage 3-4 - during last admission patient's ARB was withheld. Medication list still shows ARB and HCTZ which I have discontinued for now. Patient will be continued on amlodipine and when necessary IV hydralazine. Closely follow blood pressure trends. 5. Gout - no acute flat at this time. On daily dose of colchicine. 6. Tachycardia - patient was tachycardic during previous admission also. Will check d-dimer and TSH. 7. History of recurrent bladder cancer.  Lactic acid levels are pending. Check pro-calcitonin levels.   DVT Prophylaxis Lovenox.  Code Status: Full code.  Family Communication: Discussed with patient.  Disposition Plan: Admit to inpatient.    Gean Birchwood  NMarland Kitchen [facility] Pager [phone].  If 7PM-7AM, please contact night-coverage [email or web address] Password [email or web address] 05/09/2015, 10:01 PM

## 2015-05-10 ENCOUNTER — Encounter (HOSPITAL_COMMUNITY): Payer: Self-pay | Admitting: Radiology

## 2015-05-10 ENCOUNTER — Inpatient Hospital Stay (HOSPITAL_COMMUNITY): Payer: Medicare Other

## 2015-05-10 LAB — PROCALCITONIN: Procalcitonin: 0.28 ng/mL

## 2015-05-10 LAB — CBC
HCT: 22.3 % — ABNORMAL LOW (ref 39.0–52.0)
Hemoglobin: 7.3 g/dL — ABNORMAL LOW (ref 13.0–17.0)
MCH: 27.2 pg (ref 26.0–34.0)
MCHC: 32.7 g/dL (ref 30.0–36.0)
MCV: 83.2 fL (ref 78.0–100.0)
PLATELETS: 244 10*3/uL (ref 150–400)
RBC: 2.68 MIL/uL — ABNORMAL LOW (ref 4.22–5.81)
RDW: 17.6 % — AB (ref 11.5–15.5)
WBC: 5.2 10*3/uL (ref 4.0–10.5)

## 2015-05-10 LAB — COMPREHENSIVE METABOLIC PANEL
ALK PHOS: 63 U/L (ref 38–126)
ALT: 11 U/L — ABNORMAL LOW (ref 17–63)
ANION GAP: 9 (ref 5–15)
AST: 14 U/L — ABNORMAL LOW (ref 15–41)
Albumin: 2.6 g/dL — ABNORMAL LOW (ref 3.5–5.0)
BILIRUBIN TOTAL: 0.6 mg/dL (ref 0.3–1.2)
BUN: 39 mg/dL — ABNORMAL HIGH (ref 6–20)
CALCIUM: 8.6 mg/dL — AB (ref 8.9–10.3)
CO2: 25 mmol/L (ref 22–32)
Chloride: 105 mmol/L (ref 101–111)
Creatinine, Ser: 2.37 mg/dL — ABNORMAL HIGH (ref 0.61–1.24)
GFR calc non Af Amer: 25 mL/min — ABNORMAL LOW (ref >60)
GFR, EST AFRICAN AMERICAN: 29 mL/min — AB (ref >60)
Glucose, Bld: 368 mg/dL — ABNORMAL HIGH (ref 65–99)
POTASSIUM: 4.8 mmol/L (ref 3.5–5.1)
SODIUM: 139 mmol/L (ref 135–145)
TOTAL PROTEIN: 6.9 g/dL (ref 6.5–8.1)

## 2015-05-10 LAB — INFLUENZA PANEL BY PCR (TYPE A & B)
H1N1FLUPCR: NOT DETECTED
Influenza A By PCR: NEGATIVE
Influenza B By PCR: NEGATIVE

## 2015-05-10 LAB — IRON AND TIBC
Iron: 27 ug/dL — ABNORMAL LOW (ref 45–182)
Saturation Ratios: 10 % — ABNORMAL LOW (ref 17.9–39.5)
TIBC: 272 ug/dL (ref 250–450)
UIBC: 245 ug/dL

## 2015-05-10 LAB — GLUCOSE, CAPILLARY
GLUCOSE-CAPILLARY: 203 mg/dL — AB (ref 65–99)
Glucose-Capillary: 333 mg/dL — ABNORMAL HIGH (ref 65–99)
Glucose-Capillary: 297 mg/dL — ABNORMAL HIGH (ref 65–99)
Glucose-Capillary: 257 mg/dL — ABNORMAL HIGH (ref 65–99)
Glucose-Capillary: 156 mg/dL — ABNORMAL HIGH (ref 65–99)

## 2015-05-10 LAB — VITAMIN B12: Vitamin B-12: 415 pg/mL (ref 180–914)

## 2015-05-10 LAB — TYPE AND SCREEN
ABO/RH(D): B POS
ANTIBODY SCREEN: NEGATIVE

## 2015-05-10 LAB — STREP PNEUMONIAE URINARY ANTIGEN: STREP PNEUMO URINARY ANTIGEN: NEGATIVE

## 2015-05-10 LAB — RETICULOCYTES
RBC.: 2.72 MIL/uL — AB (ref 4.22–5.81)
RETIC COUNT ABSOLUTE: 92.5 10*3/uL (ref 19.0–186.0)
Retic Ct Pct: 3.4 % — ABNORMAL HIGH (ref 0.4–3.1)

## 2015-05-10 LAB — LACTIC ACID, PLASMA: Lactic Acid, Venous: 0.8 mmol/L (ref 0.5–2.0)

## 2015-05-10 LAB — TROPONIN I

## 2015-05-10 LAB — FERRITIN: FERRITIN: 54 ng/mL (ref 24–336)

## 2015-05-10 LAB — ABO/RH: ABO/RH(D): B POS

## 2015-05-10 LAB — FOLATE: Folate: 31.3 ng/mL (ref >5.9)

## 2015-05-10 LAB — D-DIMER, QUANTITATIVE: D-Dimer, Quant: 6.58 ug/mL-FEU — ABNORMAL HIGH (ref 0.00–0.50)

## 2015-05-10 LAB — TSH: TSH: 0.621 u[IU]/mL (ref 0.350–4.500)

## 2015-05-10 LAB — HIV ANTIBODY (ROUTINE TESTING W REFLEX): HIV SCREEN 4TH GENERATION: NONREACTIVE

## 2015-05-10 MED ORDER — FUROSEMIDE 10 MG/ML IJ SOLN
40.0000 mg | Freq: Once | INTRAMUSCULAR | Status: AC
  Administered 2015-05-10: 40 mg via INTRAVENOUS
  Filled 2015-05-10: qty 4

## 2015-05-10 MED ORDER — LEVALBUTEROL HCL 0.63 MG/3ML IN NEBU: 0.6300 mg | INHALATION_SOLUTION | Freq: Four times a day (QID) | RESPIRATORY_TRACT | Status: DC | PRN

## 2015-05-10 MED ORDER — INSULIN ASPART 100 UNIT/ML ~~LOC~~ SOLN
0.0000 [IU] | Freq: Three times a day (TID) | SUBCUTANEOUS | Status: DC
  Administered 2015-05-10 (×2): 8 [IU] via SUBCUTANEOUS
  Administered 2015-05-10 – 2015-05-11 (×2): 3 [IU] via SUBCUTANEOUS
  Administered 2015-05-11 (×2): 5 [IU] via SUBCUTANEOUS
  Administered 2015-05-12: 3 [IU] via SUBCUTANEOUS
  Administered 2015-05-12: 11 [IU] via SUBCUTANEOUS
  Administered 2015-05-12: 3 [IU] via SUBCUTANEOUS
  Administered 2015-05-13: 11 [IU] via SUBCUTANEOUS
  Administered 2015-05-13 (×2): 3 [IU] via SUBCUTANEOUS
  Administered 2015-05-14: 2 [IU] via SUBCUTANEOUS
  Administered 2015-05-14: 5 [IU] via SUBCUTANEOUS

## 2015-05-10 MED ORDER — LEVALBUTEROL HCL 1.25 MG/0.5ML IN NEBU
1.2500 mg | INHALATION_SOLUTION | Freq: Once | RESPIRATORY_TRACT | Status: AC
  Administered 2015-05-10: 1.25 mg via RESPIRATORY_TRACT
  Filled 2015-05-10 (×2): qty 0.5

## 2015-05-10 MED ORDER — INSULIN GLARGINE 100 UNIT/ML ~~LOC~~ SOLN
5.0000 [IU] | Freq: Every day | SUBCUTANEOUS | Status: DC
  Administered 2015-05-10 – 2015-05-14 (×5): 5 [IU] via SUBCUTANEOUS
  Filled 2015-05-10 (×5): qty 0.05

## 2015-05-10 MED ORDER — LEVALBUTEROL HCL 0.63 MG/3ML IN NEBU
0.6300 mg | INHALATION_SOLUTION | Freq: Four times a day (QID) | RESPIRATORY_TRACT | Status: DC
Start: 2015-05-10 — End: 2015-05-13
  Administered 2015-05-10 – 2015-05-13 (×14): 0.63 mg via RESPIRATORY_TRACT
  Filled 2015-05-10 (×14): qty 3

## 2015-05-10 MED ORDER — HYDRALAZINE HCL 20 MG/ML IJ SOLN: 10.0000 mg | INTRAMUSCULAR | Status: DC | PRN

## 2015-05-10 MED ORDER — LEVALBUTEROL HCL 1.25 MG/0.5ML IN NEBU
1.2500 mg | INHALATION_SOLUTION | Freq: Four times a day (QID) | RESPIRATORY_TRACT | Status: DC | PRN
  Filled 2015-05-10: qty 0.5

## 2015-05-10 MED ORDER — INSULIN GLARGINE 100 UNIT/ML ~~LOC~~ SOLN: 5.0000 [IU] | Freq: Every day | SUBCUTANEOUS | Status: DC

## 2015-05-10 NOTE — Progress Notes (Signed)
Patient ID: Hector Sloan, male   DOB: Jul 06, 1940, 75 y.o.   MRN: [id number] [facility] PROGRESS NOTE  Hector Sloan [id number] DOB: 1940-07-17 DOA: 05/09/2015 PCP: [name], MD   Brief narrative:    75 y.o. male with history of diabetes mellitus, hypertension, left BKA, bladder cancer, chronic kidney disease was recently admitted 3 weeks ago for gout exacerbation and now presented to the ER for evaluation of several days duration of progressively worsening dyspnea with exertion and at rest, associated with productive cough of yellow sputum, fevers, chills, poor oral intake.   In ED, pt was in mild distress due to dyspnea with VS notable for RR up to 27 bpm and HR up to 118, imaging studies worrisome for   Assessment/Plan:    Principal Problem:   HCAP (healthcare-associated pneumonia) - bilateral lobes, unknown pathogen at this time - continue vancomycin and cefepime day #2 - follow up on sputum analysis - allow BD's as needed   Active Problems:   Chest tightness - appears to be related to coughing spells - will cycle troponins, ECHO also requested - allow analgesia as needed     Acute renal failure superimposed on stage 3 chronic kidney disease (HCC) - baseline Cr ~ 2, close monitoring of renal function     Normocytic anemia, chronic disease, IDA and DM, CKD - anemia panel pending - transfuse for Hg < 7 - CBC in AM    DM (diabetes mellitus), 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) - continue Insulin per home medical regimen   DVT prophylaxis - Lovenox SQ  Code Status: Full.  Family Communication:  plan of care discussed with the patient Disposition Plan: Home by 05/2011   IV access:  Peripheral IV  Procedures and diagnostic studies:    Dg Chest 2 View 05/09/2015  Patchy consolidations in the bilateral lungs unchanged compared to prior exam consistent with no pneumonia. Bilateral pleural effusions, slightly increased on the right.     Medical Consultants:  None  Other Consultants:  None  IAnti-Infectives:   Vancomycin 2/9 --> Cefepime 2/9 -->  Faye Ramsay, MD  [facility] Pager [phone]  If 7PM-7AM, please contact night-coverage [email or web address] Password [facility] 05/10/2015, 8:43 AM   LOS: 1 day   HPI/Subjective: No events overnight. Reports feeling better.   Objective: Filed Vitals:   05/09/15 2220 05/10/15 0317 05/10/15 0505 05/10/15 0826  BP: 160/80  140/68 154/81  Pulse: 111  90   Temp: 98.6 F (37 C)  98.7 F (37.1 C) 98.1 F (36.7 C)  TempSrc: Oral  Oral Oral  Resp: 24  20   Height: 6\' 1"  (1.854 m)     Weight: 93.4 kg (205 lb 14.6 oz)     SpO2: 98% 97% 98% 98%    Intake/Output Summary (Last 24 hours) at 05/10/15 0843 Last data filed at 05/10/15 [id number]  Gross per 24 hour  Intake      0 ml  Output    550 ml  Net   -550 ml    Exam:   General:  Pt is alert, follows commands appropriately, not in acute distress  Cardiovascular: Regular rate and rhythm, SEM 2/6, no rubs, no gallops  Respiratory: Rhonchi at bases, mild exp wheezing  Abdomen: Soft, non tender, non distended, bowel sounds present, no guarding  Data Reviewed: Basic Metabolic Panel:  Recent Labs Lab 05/09/15 1815 05/10/15 0525  NA 137 139  K 4.6 4.8  CL 103 105  CO2 22 25  GLUCOSE 455*  368*  BUN 38* 39*  CREATININE 2.43* 2.37*  CALCIUM 9.1 8.6*   Liver Function Tests:  Recent Labs Lab 05/10/15 0525  AST 14*  ALT 11*  ALKPHOS 63  BILITOT 0.6  PROT 6.9  ALBUMIN 2.6*   CBC:  Recent Labs Lab 05/09/15 1815 05/10/15 0525  WBC 5.8 5.2  NEUTROABS 4.8  --   HGB 8.5* 7.3*  HCT 26.0* 22.3*  MCV 82.8 83.2  PLT 311 244   Cardiac Enzymes:  Recent Labs Lab 05/10/15 0525  TROPONINI <0.03   CBG:  Recent Labs Lab 05/10/15 0527 05/10/15 0746  GLUCAP 333* 297*    Scheduled Meds: . amLODipine  10 mg Oral Daily  . atorvastatin  20 mg Oral Daily  . brimonidine  1 drop Left Eye Q12H  . ceFEPime  (MAXIPIME) IV  1 g Intravenous Q24H  . colchicine  0.6 mg Oral Daily  . enoxaparin (LOVENOX) injection  40 mg Subcutaneous QHS  . insulin aspart  0-15 Units Subcutaneous TID WC  . insulin glargine  5 Units Subcutaneous Daily  . latanoprost  1 drop Both Eyes QHS  . levalbuterol  0.63 mg Nebulization Q6H  . linagliptin  5 mg Oral Daily  . pantoprazole  40 mg Oral Daily  . pioglitazone  30 mg Oral Daily  . timolol  1 drop Both Eyes BID  . vancomycin  1,000 mg Intravenous Q24H   Continuous Infusions:        `

## 2015-05-10 NOTE — Progress Notes (Signed)
Pt experiencing increased work of breathing; RN notified and respiratory therapist called to bedside for assistance; will continue to monitor

## 2015-05-10 NOTE — Progress Notes (Signed)
Utilization review completed.  

## 2015-05-11 ENCOUNTER — Other Ambulatory Visit (HOSPITAL_COMMUNITY): Payer: Medicare Other

## 2015-05-11 LAB — CBC
HCT: 23.2 % — ABNORMAL LOW (ref 39.0–52.0)
Hemoglobin: 7.5 g/dL — ABNORMAL LOW (ref 13.0–17.0)
MCH: 27.1 pg (ref 26.0–34.0)
MCHC: 32.3 g/dL (ref 30.0–36.0)
MCV: 83.8 fL (ref 78.0–100.0)
PLATELETS: 271 10*3/uL (ref 150–400)
RBC: 2.77 MIL/uL — AB (ref 4.22–5.81)
RDW: 17.5 % — ABNORMAL HIGH (ref 11.5–15.5)
WBC: 5.7 10*3/uL (ref 4.0–10.5)

## 2015-05-11 LAB — GLUCOSE, CAPILLARY
GLUCOSE-CAPILLARY: 224 mg/dL — AB (ref 65–99)
GLUCOSE-CAPILLARY: 270 mg/dL — AB (ref 65–99)
Glucose-Capillary: 169 mg/dL — ABNORMAL HIGH (ref 65–99)
Glucose-Capillary: 211 mg/dL — ABNORMAL HIGH (ref 65–99)

## 2015-05-11 LAB — HEMOGLOBIN A1C
HEMOGLOBIN A1C: 9.8 % — AB (ref 4.8–5.6)
MEAN PLASMA GLUCOSE: 235 mg/dL

## 2015-05-11 LAB — BASIC METABOLIC PANEL
Anion gap: 8 (ref 5–15)
BUN: 39 mg/dL — AB (ref 6–20)
CO2: 25 mmol/L (ref 22–32)
CREATININE: 2.33 mg/dL — AB (ref 0.61–1.24)
Calcium: 8.6 mg/dL — ABNORMAL LOW (ref 8.9–10.3)
Chloride: 103 mmol/L (ref 101–111)
GFR calc Af Amer: 30 mL/min — ABNORMAL LOW (ref >60)
GFR, EST NON AFRICAN AMERICAN: 26 mL/min — AB (ref >60)
Glucose, Bld: 238 mg/dL — ABNORMAL HIGH (ref 65–99)
POTASSIUM: 4.4 mmol/L (ref 3.5–5.1)
SODIUM: 136 mmol/L (ref 135–145)

## 2015-05-11 LAB — BRAIN NATRIURETIC PEPTIDE: B NATRIURETIC PEPTIDE 5: 95.2 pg/mL (ref 0.0–100.0)

## 2015-05-11 LAB — TROPONIN I

## 2015-05-11 MED ORDER — DEXTROSE 5 % IV SOLN
2.0000 g | INTRAVENOUS | Status: DC
  Administered 2015-05-11 – 2015-05-12 (×2): 2 g via INTRAVENOUS
  Filled 2015-05-11 (×2): qty 2

## 2015-05-11 NOTE — Progress Notes (Signed)
Pharmacy Antibiotic Note  Hector Sloan is a 75 y.o. male admitted on 05/09/2015 with pneumonia.  Pharmacy has been consulted for Cefepime and Vancomycin dosing.  Plan: Adjust Cefepime to 2g IV q24h for weight and CrCl~34 ml/min. Continue current dose of vancomycin 1g IV q24h.  Height: 6\' 1"  (185.4 cm) Weight: 203 lb 11.3 oz (92.4 kg) IBW/kg (Calculated) : 79.9  Temp (24hrs), Avg:98.3 F (36.8 C), Min:98.1 F (36.7 C), Max:98.7 F (37.1 C)   Recent Labs Lab 05/09/15 1815 05/10/15 0516 05/10/15 0525 05/11/15 0203  WBC 5.8  --  5.2 5.7  CREATININE 2.43*  --  2.37* 2.33*  LATICACIDVEN  --  0.8  --   --     Estimated Creatinine Clearance: 30.5 mL/min (by C-G formula based on Cr of 2.33).    No Known Allergies  Antimicrobials this admission: 2/9 vanc>> 2/9 cefepime>>   Levels/dose changes this admission: -  Microbiology results: None this admission.  Thank you for allowing pharmacy to be a part of this patient's care.  Hershal Coria 05/11/2015 11:11 AM

## 2015-05-11 NOTE — Progress Notes (Addendum)
Patient ID: Hector Sloan, male   DOB: 1940/12/05, 75 y.o.   MRN: [id number] [facility] PROGRESS NOTE  Hector Sloan [id number] DOB: [birth year] DOA: 05/09/2015 PCP: [name], MD   Brief narrative:    75 y.o. male with history of diabetes mellitus, hypertension, left BKA, bladder cancer, chronic kidney disease was recently admitted 3 weeks ago for gout exacerbation and now presented to the ER for evaluation of several days duration of progressively worsening dyspnea with exertion and at rest, associated with productive cough of yellow sputum, fevers, chills, poor oral intake.   In ED, pt was in mild distress due to dyspnea with VS notable for RR up to 27 bpm and HR up to 118, imaging studies worrisome for   Assessment/Plan:    Principal Problem:   HCAP (healthcare-associated pneumonia) - bilateral lobes, unknown pathogen at this time - continue vancomycin and cefepime day #3, plan to narrow down abx in next 24 to 48 hours  - follow up on sputum analysis - allow BD's as needed   Active Problems:   Chest tightness - appears to be related to coughing spells - 4 sets of troponins negative, ECHO pending  - allow analgesia as needed     Acute renal failure superimposed on stage 3 chronic kidney disease (HCC) - baseline Cr ~ 2, close monitoring of renal function  - Cr remains stable in the past 24 hours around baseline  - BMP in AM    Normocytic anemia, chronic disease, IDA and DM, CKD - anemia panel still pending - transfuse for Hg < 7 - CBC in AM    DM (diabetes mellitus), 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  Family Communication:  plan of care discussed with the patient Disposition Plan: Home vs SNF by 2/13, PT  recommends SNF  IV access:  Peripheral IV  Procedures and diagnostic studies:    Dg Chest 2 View 05/09/2015  Patchy consolidations in the bilateral lungs unchanged compared to prior exam consistent with no pneumonia. Bilateral pleural effusions, slightly increased on the right.    Medical Consultants:  None  Other Consultants:  None  IAnti-Infectives:   Vancomycin 2/9 --> Cefepime 2/9 -->  Faye Ramsay, MD  [facility] Pager [phone]  If 7PM-7AM, please contact night-coverage [email or web address] Password [email or web address] 05/11/2015, 2:02 PM   LOS: 2 days   HPI/Subjective: No events overnight. Reports feeling better. Less dyspnea   Objective: Filed Vitals:   05/10/15 2034 05/11/15 0119 05/11/15 0441 05/11/15 0900  BP: 132/60  151/97   Pulse: 100  99   Temp: 98.7 F (37.1 C)  98.1 F (36.7 C)   TempSrc: Oral  Oral   Resp: 22  22   Height:      Weight:   92.4 kg (203 lb 11.3 oz)   SpO2: 98% 95% 94% 98%    Intake/Output Summary (Last 24 hours) at 05/11/15 1402 Last data filed at 05/11/15 0700  Gross per 24 hour  Intake    480 ml  Output    700 ml  Net   -220 ml    Exam:   General:  Pt is alert, follows commands appropriately, not in acute distress  Cardiovascular: Regular rhythm, tachycardic, SEM 2/6, no rubs, no  gallops  Respiratory: Rhonchi at bases, mild exp wheezing still present   Abdomen: Soft, non tender, non distended, bowel sounds present, no guarding  Data Reviewed: Basic Metabolic Panel:  Recent Labs Lab 05/09/15 1815 05/10/15 0525 05/11/15 0203  NA 137 139 136  K 4.6 4.8 4.4  CL 103 105 103  CO2 22 25 25   GLUCOSE 455* 368* 238*  BUN 38* 39* 39*  CREATININE 2.43* 2.37* 2.33*  CALCIUM 9.1 8.6* 8.6*   Liver Function Tests:  Recent Labs Lab 05/10/15 0525  AST 14*  ALT 11*  ALKPHOS 63  BILITOT 0.6  PROT 6.9  ALBUMIN 2.6*   CBC:  Recent Labs Lab 05/09/15 1815 05/10/15 0525 05/11/15 0203  WBC 5.8 5.2 5.7  NEUTROABS 4.8  --   --   HGB  8.5* 7.3* 7.5*  HCT 26.0* 22.3* 23.2*  MCV 82.8 83.2 83.8  PLT 311 244 271   Cardiac Enzymes:  Recent Labs Lab 05/10/15 0525 05/10/15 1407 05/10/15 2003 05/11/15 0203  TROPONINI <0.03 <0.03 <0.03 <0.03   CBG:  Recent Labs Lab 05/10/15 1149 05/10/15 1635 05/10/15 2038 05/11/15 0738 05/11/15 1152  GLUCAP 257* 156* 203* 169* 211*    Scheduled Meds: . amLODipine  10 mg Oral Daily  . atorvastatin  20 mg Oral Daily  . brimonidine  1 drop Left Eye Q12H  . ceFEPime (MAXIPIME) IV  2 g Intravenous Q24H  . colchicine  0.6 mg Oral Daily  . enoxaparin (LOVENOX) injection  40 mg Subcutaneous QHS  . insulin aspart  0-15 Units Subcutaneous TID WC  . insulin glargine  5 Units Subcutaneous Daily  . latanoprost  1 drop Both Eyes QHS  . levalbuterol  0.63 mg Nebulization Q6H  . linagliptin  5 mg Oral Daily  . pantoprazole  40 mg Oral Daily  . pioglitazone  30 mg Oral Daily  . timolol  1 drop Both Eyes BID  . vancomycin  1,000 mg Intravenous Q24H   Continuous Infusions:        `

## 2015-05-11 NOTE — Evaluation (Signed)
Physical Therapy Evaluation Patient Details Name: Hector Sloan MRN: [id number] DOB: [birth year] Today's Date: 05/11/2015   History of Present Illness  75 yo male admitted with Pna. Hx of L BKA, HTN, PVD, DM, neuropathy, gout. Pt lives alone.   Clinical Impression  On eval, pt required Min assist for bed mobility and lateral scoot transfer towards [place]. Sat EOB ~3-4 minutes with Min guard assist. Pt felt too weak to attempt standing and requested we defer until next session. Discussed d/c plan-pt states he is considering going to rehab prior to returning home.     Follow Up Recommendations SNF    Equipment Recommendations  None recommended by PT    Recommendations for Other Services       Precautions / Restrictions Precautions Precautions: Fall Precaution Comments: L BKA Restrictions Weight Bearing Restrictions: No      Mobility  Bed Mobility Overal bed mobility: Needs Assistance Bed Mobility: Supine to Sit;Sit to Supine     Supine to sit: Min assist;HOB elevated Sit to supine: Min assist;HOB elevated   General bed mobility comments: Assist for trunk and to scoot to EOB. Increased time.   Transfers Overall transfer level: Needs assistance   Transfers: Lateral/Scoot Transfers          Lateral/Scoot Transfers: Min assist General transfer comment: Assist with bedpad for lateral scoot towards HOB. Pt declined to attempt standing-feel too weak  Ambulation/Gait                Stairs            Wheelchair Mobility    Modified Rankin (Stroke Patients Only)       Balance Overall balance assessment: Needs assistance Sitting-balance support: Bilateral upper extremity supported;Feet supported Sitting balance-Leahy Scale: Good Sitting balance - Comments: Sat EOB ~3-4 minutes                                     Pertinent Vitals/Pain Pain Assessment: No/denies pain    Home Living Family/patient expects to be discharged to:: Private  residence Living Arrangements: Alone   Type of Home: House Home Access: Stairs to enter Entrance Stairs-Rails: Psychiatric nurse of Steps: 4-5 Home Layout: One level Home Equipment: Cane - single point;Walker - 2 wheels;Wheelchair - manual      Prior Function Level of Independence: Independent with assistive device(s)               Hand Dominance        Extremity/Trunk Assessment   Upper Extremity Assessment: Defer to OT evaluation           Lower Extremity Assessment: RLE deficits/detail RLE Deficits / Details: Strength at least 4/5 LLE Deficits / Details: hx of L BKA  Cervical / Trunk Assessment: Normal  Communication   Communication: HOH  Cognition Arousal/Alertness: Awake/alert Behavior During Therapy: WFL for tasks assessed/performed Overall Cognitive Status: Within Functional Limits for tasks assessed                      General Comments      Exercises        Assessment/Plan    PT Assessment Patient needs continued PT services  PT Diagnosis Generalized weakness;Difficulty walking   PT Problem List Decreased strength;Decreased activity tolerance;Decreased balance;Decreased mobility;Decreased knowledge of use of DME  PT Treatment Interventions DME instruction;Gait training;Functional mobility training;Therapeutic activities;Patient/family education;Balance training;Therapeutic exercise   PT Goals (Current  goals can be found in the Care Plan section) Acute Rehab PT Goals Patient Stated Goal: to possibly go for rehab PT Goal Formulation: With patient Time For Goal Achievement: 05/25/15 Potential to Achieve Goals: Good    Frequency Min 3X/week   Barriers to discharge        Co-evaluation               End of Session   Activity Tolerance: Patient limited by fatigue Patient left: in bed;with call bell/phone within reach;with bed alarm set           Time: [id number] PT Time Calculation (min) (ACUTE ONLY):  9 min   Charges:   PT Evaluation $PT Eval Moderate Complexity: 1 Procedure     PT G Codes:        Weston Anna, MPT Pager: [phone]

## 2015-05-12 ENCOUNTER — Inpatient Hospital Stay (HOSPITAL_COMMUNITY): Payer: Medicare Other

## 2015-05-12 DIAGNOSIS — R0600 Dyspnea, unspecified: Secondary | ICD-10-CM

## 2015-05-12 DIAGNOSIS — J189 Pneumonia, unspecified organism: Principal | ICD-10-CM

## 2015-05-12 DIAGNOSIS — J9601 Acute respiratory failure with hypoxia: Secondary | ICD-10-CM

## 2015-05-12 DIAGNOSIS — N183 Chronic kidney disease, stage 3 (moderate): Secondary | ICD-10-CM

## 2015-05-12 DIAGNOSIS — D638 Anemia in other chronic diseases classified elsewhere: Secondary | ICD-10-CM

## 2015-05-12 DIAGNOSIS — N179 Acute kidney failure, unspecified: Secondary | ICD-10-CM

## 2015-05-12 LAB — PROCALCITONIN: PROCALCITONIN: 0.15 ng/mL

## 2015-05-12 LAB — BASIC METABOLIC PANEL
ANION GAP: 9 (ref 5–15)
BUN: 35 mg/dL — ABNORMAL HIGH (ref 6–20)
CALCIUM: 8.9 mg/dL (ref 8.9–10.3)
CO2: 25 mmol/L (ref 22–32)
Chloride: 103 mmol/L (ref 101–111)
Creatinine, Ser: 2.09 mg/dL — ABNORMAL HIGH (ref 0.61–1.24)
GFR, EST AFRICAN AMERICAN: 34 mL/min — AB (ref >60)
GFR, EST NON AFRICAN AMERICAN: 29 mL/min — AB (ref >60)
Glucose, Bld: 202 mg/dL — ABNORMAL HIGH (ref 65–99)
Potassium: 4.3 mmol/L (ref 3.5–5.1)
Sodium: 137 mmol/L (ref 135–145)

## 2015-05-12 LAB — GLUCOSE, CAPILLARY
GLUCOSE-CAPILLARY: 341 mg/dL — AB (ref 65–99)
Glucose-Capillary: 189 mg/dL — ABNORMAL HIGH (ref 65–99)
Glucose-Capillary: 163 mg/dL — ABNORMAL HIGH (ref 65–99)
Glucose-Capillary: 207 mg/dL — ABNORMAL HIGH (ref 65–99)

## 2015-05-12 LAB — CBC
HEMATOCRIT: 25.4 % — AB (ref 39.0–52.0)
Hemoglobin: 8.2 g/dL — ABNORMAL LOW (ref 13.0–17.0)
MCH: 27 pg (ref 26.0–34.0)
MCHC: 32.3 g/dL (ref 30.0–36.0)
MCV: 83.6 fL (ref 78.0–100.0)
Platelets: 296 10*3/uL (ref 150–400)
RBC: 3.04 MIL/uL — ABNORMAL LOW (ref 4.22–5.81)
RDW: 17.4 % — AB (ref 11.5–15.5)
WBC: 5.5 10*3/uL (ref 4.0–10.5)

## 2015-05-12 MED ORDER — PERFLUTREN LIPID MICROSPHERE
1.0000 mL | INTRAVENOUS | Status: AC | PRN
  Administered 2015-05-12: 2 mL via INTRAVENOUS
  Filled 2015-05-12: qty 10

## 2015-05-12 NOTE — Progress Notes (Signed)
Patient ID: Axzel Ristine, male   DOB: 1940/05/10, 75 y.o.   MRN: [id number] [facility] PROGRESS NOTE  Hebert Harclerode [id number] DOB: Dec 23, 1940 DOA: 05/09/2015 PCP: [name], MD  Brief narrative:    75 y.o. male with history of diabetes mellitus, hypertension, left BKA, bladder cancer, chronic kidney disease was recently admitted 3 weeks ago for gout exacerbation and now presented to the ER for evaluation of progressively worsening dyspnea with exertion and at rest associated with productive cough of yellow sputum, fevers, chills, poor oral intake.   Pt was admitted for management of pneumonia.    Assessment/Plan:    Principal Problem:  HCAP (healthcare-associated pneumonia) / Acute respiratory failure with hypoxia / Bilateral pleural effusions - Concern for pneumonia in addition to plural effusions bilaterally - Will repeat CXR tomorrow to monitor on resolution of pneumonia - Continue current antibiotics, vanco and cefepime  - Influenza negative, legionella pending - Continue current nebulizer treatments: xopenex every 6 hours scheduled and  As needed for shortness of breath or wheezing    Active Problems:  Chest tightness - Likely due to pneumonia - ECHO is pending    Acute renal failure superimposed on stage 3 chronic kidney disease (HCC) - Baseline Cr ~ 2 - Cr better this am, 2.33 --> 2.09   Anemia of chronic disease  - Due to combination of CKD and malignancy- Hgb stable at 8.2   Uncontrolled DM (diabetes mellitus), 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  Family Communication:  plan of care discussed with the patient Disposition Plan:  Home or SNF likely by 2/14  IV access:  Peripheral IV  Procedures and diagnostic studies:    Dg Chest 2 View 05/09/2015  Patchy consolidations in the bilateral lungs unchanged compared to prior exam consistent with no pneumonia. Bilateral pleural effusions, slightly increased on the right. Electronically Signed   By: Abelardo Diesel M.D.   On: 05/09/2015 18:13   Ct Chest Wo Contrast 05/10/2015  Small pleural collections LEFT larger than RIGHT with densely calcified rims, question related to prior empyema or hemothorax. BILATERAL upper and lower lobe pulmonary infiltrates question pneumonia. Enlarged mediastinal nodes which may be reactive in the setting of pulmonary infiltrates. BILATERAL pleural effusions larger on RIGHT, with RIGHT demonstrating loculation. Extensive atherosclerotic disease. Due to presence of multiple parenchymal abnormalities, consider followup CT imaging in 3 months to ensure resolution of areas of lung opacity particularly in the RIGHT upper and LEFT lower lobes and exclude underlying tumor. Electronically Signed   By: Lavonia Dana M.D.   On: 05/10/2015 10:29   Dg Chest Port 1 View 05/10/2015  Loculated pleural fluid collections bilaterally. Scattered pleural calcifications. Enlargement of cardiac silhouette. Scattered pulmonary infiltrates. Electronically Signed   By: Lavonia Dana M.D.   On: 05/10/2015 14:38   Medical Consultants:  None   IAnti-Infectives:   Vancomycin 2/9 --> Cefepime 2/9 -->   Leisa Lenz, MD  [facility] Pager [phone]  Time spent in minutes: 25 minutes  If 7PM-7AM, please contact night-coverage [email or web address] Password [facility] 05/12/2015, 12:47 PM   LOS: 3 days    HPI/Subjective: No acute overnight events. Patient reports he feels  better this am but weak.  Objective: Filed Vitals:   05/11/15 1921 05/11/15 2024 05/12/15 0556 05/12/15 0825  BP:  139/59 155/72   Pulse:  103 101   Temp:  98.7 F (37.1 C) 98 F (36.7 C)   TempSrc:  Oral  Oral   Resp:  17 16   Height:      Weight:   92.5 kg (203 lb 14.8 oz)   SpO2: 94% 98% 98% 94%    Intake/Output Summary (Last 24 hours) at 05/12/15 1247 Last data filed at 05/12/15 0900  Gross per 24 hour  Intake    720 ml  Output    850 ml  Net   -130 ml    Exam:   General:  Pt is alert, follows commands appropriately, not in acute distress  Cardiovascular: Regular rate and rhythm, S1/S2 (+)  Respiratory: Diminished, no wheezing  Abdomen: Soft, non tender, non distended, bowel sounds present  Extremities: No edema, pulses palpable, Left BKA  Neuro: Grossly nonfocal  Data Reviewed: Basic Metabolic Panel:  Recent Labs Lab 05/09/15 1815 05/10/15 0525 05/11/15 0203 05/12/15 0618  NA 137 139 136 137  K 4.6 4.8 4.4 4.3  CL 103 105 103 103  CO2 22 25 25 25   GLUCOSE 455* 368* 238* 202*  BUN 38* 39* 39* 35*  CREATININE 2.43* 2.37* 2.33* 2.09*  CALCIUM 9.1 8.6* 8.6* 8.9   Liver Function Tests:  Recent Labs Lab 05/10/15 0525  AST 14*  ALT 11*  ALKPHOS 63  BILITOT 0.6  PROT 6.9  ALBUMIN 2.6*   No results for input(s): LIPASE, AMYLASE in the last 168 hours. No results for input(s): AMMONIA in the last 168 hours. CBC:  Recent Labs Lab 05/09/15 1815 05/10/15 0525 05/11/15 0203 05/12/15 0618  WBC 5.8 5.2 5.7 5.5  NEUTROABS 4.8  --   --   --   HGB 8.5* 7.3* 7.5* 8.2*  HCT 26.0* 22.3* 23.2* 25.4*  MCV 82.8 83.2 83.8 83.6  PLT 311 244 271 296   Cardiac Enzymes:  Recent Labs Lab 05/10/15 0525 05/10/15 1407 05/10/15 2003 05/11/15 0203  TROPONINI <0.03 <0.03 <0.03 <0.03   BNP: Invalid input(s): POCBNP CBG:  Recent Labs Lab 05/11/15 1152 05/11/15 1621 05/11/15 2211 05/12/15 0737 05/12/15 1149  GLUCAP 211* 224* 270* 189* 341*     No results found for this or any previous visit (from the past 240 hour(s)).   Scheduled Meds: . amLODipine  10 mg Oral Daily  . atorvastatin  20 mg Oral Daily  . ceFEPime (MAXIPIME)   2 g Intravenous Q24H   . colchicine  0.6 mg Oral Daily  . enoxaparin (LOVENOX) injection  40 mg Subcutaneous QHS  . insulin aspart  0-15 Units Subcutaneous TID WC  . insulin glargine  5 Units Subcutaneous Daily  . levalbuterol  0.63 mg Nebulization Q6H  . linagliptin  5 mg Oral Daily  . pantoprazole  40 mg Oral Daily  . pioglitazone  30 mg Oral Daily  . vancomycin  1,000 mg Intravenous Q24H

## 2015-05-12 NOTE — NC FL2 (Signed)
[place] LEVEL OF CARE SCREENING TOOL     IDENTIFICATION  Patient Name: Hector Sloan Birthdate: Aug 05, 1940 Sex: male Admission Date (Current Location): 05/09/2015  [facility] and [place] Number:  Herbalist and Address:         Provider Number: [id number]  Attending Physician Name and Address:  Robbie Lis, MD  Relative Name and Phone Number:       Current Level of Care: Hospital Recommended Level of Care: [place] Prior Approval Number:    Date Approved/Denied:   PASRR Number: [id number]  Discharge Plan: SNF    Current Diagnoses: Patient Active Problem List   Diagnosis Date Noted  . HCAP (healthcare-associated pneumonia) 05/09/2015  . DM (diabetes mellitus), type 2 with renal complications ([place]) [id number]  . Acute renal failure superimposed on stage 3 chronic kidney disease ([place]) 04/16/2015  . Normocytic anemia 04/16/2015    Orientation RESPIRATION BLADDER Height & Weight     Self, Time, Situation, Place  O2 (2 liters)   Weight: 203 lb 14.8 oz (92.5 kg) Height:  6\' 1"  (185.4 cm)  BEHAVIORAL SYMPTOMS/MOOD NEUROLOGICAL BOWEL NUTRITION STATUS        Diet (heart healthy)  AMBULATORY STATUS COMMUNICATION OF NEEDS Skin   Limited Assist Verbally Normal                       Personal Care Assistance Level of Assistance  Dressing, Bathing Bathing Assistance: Limited assistance   Dressing Assistance: Limited assistance     Functional Limitations Info             SPECIAL CARE FACTORS FREQUENCY                       Contractures      Additional Factors Info  Allergies   Allergies Info: no known allergies           Current Medications (05/12/2015):  This is the current hospital active medication list Current Facility-Administered Medications  Medication Dose Route Frequency Provider Last Rate Last Dose  . acetaminophen (TYLENOL) tablet 650 mg  650 mg Oral Q6H PRN Rise Patience, MD       Or  . acetaminophen (TYLENOL) suppository 650 mg  650 mg Rectal Q6H PRN Rise Patience, MD      . amLODipine (NORVASC) tablet 10 mg  10 mg Oral Daily Rise Patience, MD   10 mg at 05/11/15 1034  . atorvastatin (LIPITOR) tablet 20 mg  20 mg Oral Daily Rise Patience, MD   20 mg at 05/11/15 1722  . brimonidine (ALPHAGAN) 0.2 % ophthalmic solution 1 drop  1 drop Left Eye Q12H Rise Patience, MD   1 drop at 05/11/15 2224  . ceFEPIme (MAXIPIME) 2 g in dextrose 5 % 50 mL IVPB  2 g Intravenous Q24H Donald Prose Runyon, RPH   2 g at 05/11/15 2014  . colchicine tablet 0.6 mg  0.6 mg Oral Daily Rise Patience, MD   0.6 mg at 05/11/15 1034  . enoxaparin (LOVENOX) injection 40 mg  40 mg Subcutaneous QHS Rise Patience, MD   40 mg at 05/11/15 2223  . hydrALAZINE (APRESOLINE) injection 10 mg  10 mg Intravenous Q4H PRN Rise Patience, MD      . HYDROcodone-acetaminophen (NORCO/VICODIN) 5-325 MG per tablet 1-2 tablet  1-2 tablet Oral Q4H PRN Rise Patience, MD      . insulin aspart (  novoLOG) injection 0-15 Units  0-15 Units Subcutaneous TID WC Rise Patience, MD   3 Units at 05/12/15 [phone]  . insulin glargine (LANTUS) injection 5 Units  5 Units Subcutaneous Daily Rise Patience, MD   5 Units at 05/11/15 1034  . latanoprost (XALATAN) 0.005 % ophthalmic solution 1 drop  1 drop Both Eyes QHS Rise Patience, MD   1 drop at 05/11/15 2224  . levalbuterol (XOPENEX) nebulizer solution 0.63 mg  0.63 mg Nebulization Q6H Rise Patience, MD   0.63 mg at 05/12/15 0823  . levalbuterol (XOPENEX) nebulizer solution 0.63 mg  0.63 mg Nebulization Q6H PRN Rise Patience, MD      . linagliptin (TRADJENTA) tablet 5 mg  5 mg Oral Daily Rise Patience, MD   5 mg at 05/11/15 1033  . ondansetron (ZOFRAN) tablet 4 mg  4 mg Oral Q6H PRN Rise Patience, MD       Or  . ondansetron [facility]) injection 4 mg  4 mg Intravenous Q6H PRN Rise Patience, MD      . pantoprazole  (PROTONIX) EC tablet 40 mg  40 mg Oral Daily Rise Patience, MD   40 mg at 05/11/15 1034  . perflutren lipid microspheres (DEFINITY) IV suspension  1-10 mL Intravenous PRN Theodis Blaze, MD   2 mL at 05/12/15 0859  . pioglitazone (ACTOS) tablet 30 mg  30 mg Oral Daily Rise Patience, MD   30 mg at 05/11/15 1033  . timolol (TIMOPTIC) 0.5 % ophthalmic solution 1 drop  1 drop Both Eyes BID Rise Patience, MD   1 drop at 05/11/15 2225  . vancomycin (VANCOCIN) IVPB 1000 mg/200 mL premix  1,000 mg Intravenous Q24H Lynelle Doctor, RPH   1,000 mg at 05/11/15 2114     Discharge Medications: Please see discharge summary for a list of discharge medications.  Relevant Imaging Results:  Relevant Lab Results:   Additional Information [id number]  Carlean Jews, LCSW

## 2015-05-12 NOTE — Progress Notes (Signed)
Echocardiogram 2D Echocardiogram has been performed.  Aggie Cosier 05/12/2015, 9:09 AM

## 2015-05-12 NOTE — Clinical Social Work Note (Signed)
CSW obtained pt pasaar number and sent information to SNF's in [place] area.  Pt considering [facility] as a possible choice for rehab stating that this SNF was close to his home.  Pt would like to hear all options once SNF's receive information and send acceptance or denials.  Dede Query, LCSW [place] Worker - Weekend Coverage cell #: [phone]

## 2015-05-12 NOTE — Clinical Social Work Note (Signed)
Clinical Social Work Assessment  Patient Details  Name: Hector Sloan MRN: [id number] Date of Birth: 11-28-1940  Date of referral:  05/11/15               Reason for consult:  Facility Placement                Permission sought to share information with:  Facility Art therapist granted to share information::  Yes, Verbal Permission Granted  Name::        Agency::     Relationship::     Contact Information:     Housing/Transportation Living arrangements for the past 2 months:  Single Family Home Source of Information:  Patient Patient Interpreter Needed:  None Criminal Activity/Legal Involvement Pertinent to Current Situation/Hospitalization:    Significant Relationships:  Siblings (brothers) Lives with:  Self Do you feel safe going back to the place where you live?    Need for family participation in patient care:  No (Coment)  Care giving concerns:  No caregiver   Facilities manager / plan:  CSW met with pt at bedside to discuss discharge needs.  CSW provided explanation of SNF process and encouraged pt to ask questions and discuss history and needs.  CSW provided pt with a list of facilities to look over and discuss with his brothers.  CSW will send pt information to SNF's in [place] area and obtain pasaar number.    Employment status:  Retired Forensic scientist:  Medicare PT Recommendations:  [place] / Referral to community resources:     Patient/Family's Response to care:  Pt discussed living alone and having some 5 steps he must utilize to get into his home.  Pt stated that he had not history of rehab and was willing to go to rehab at discharge.  Pt reviewed list and stated that [facility] is close to his home and he will consider this SNF as well as others  Patient/Family's Understanding of and Emotional Response to Diagnosis, Current Treatment, and Prognosis:  Pt appeared to understand the SNF  process and displayed a willingness to follow up with PT recommendations and go to SNF at discharge.  Pt did not have any idea of what the rehab process was and had never visited a facility but showed willingness to pick a rehab facility at discharge.  Emotional Assessment Appearance:  Appears younger than stated age Attitude/Demeanor/Rapport:   (cooperative) Affect (typically observed):  Accepting Orientation:  Oriented to Self, Oriented to Place, Oriented to  Time, Oriented to Situation Alcohol / Substance use:    Psych involvement (Current and /or in the community):  No (Comment)  Discharge Needs  Concerns to be addressed:    Readmission within the last 30 days:    Current discharge risk:    Barriers to Discharge:  No Barriers Identified   Carlean Jews, LCSW 05/12/2015, 8:34 AM

## 2015-05-13 ENCOUNTER — Inpatient Hospital Stay (HOSPITAL_COMMUNITY): Payer: Medicare Other

## 2015-05-13 DIAGNOSIS — I10 Essential (primary) hypertension: Secondary | ICD-10-CM

## 2015-05-13 LAB — GLUCOSE, CAPILLARY
GLUCOSE-CAPILLARY: 314 mg/dL — AB (ref 65–99)
GLUCOSE-CAPILLARY: 183 mg/dL — AB (ref 65–99)
Glucose-Capillary: 166 mg/dL — ABNORMAL HIGH (ref 65–99)
Glucose-Capillary: 205 mg/dL — ABNORMAL HIGH (ref 65–99)

## 2015-05-13 LAB — LEGIONELLA ANTIGEN, URINE

## 2015-05-13 MED ORDER — LEVOFLOXACIN 750 MG PO TABS
750.0000 mg | ORAL_TABLET | ORAL | Status: DC
  Administered 2015-05-13: 750 mg via ORAL
  Filled 2015-05-13: qty 1

## 2015-05-13 MED ORDER — LEVALBUTEROL HCL 0.63 MG/3ML IN NEBU
0.6300 mg | INHALATION_SOLUTION | Freq: Three times a day (TID) | RESPIRATORY_TRACT | Status: DC
  Administered 2015-05-13 – 2015-05-14 (×2): 0.63 mg via RESPIRATORY_TRACT
  Filled 2015-05-13 (×2): qty 3

## 2015-05-13 NOTE — Progress Notes (Signed)
SNF bed offers presented to patient and his friend, [name], at bedside. Patient has selected SNF bed at [facility]- MD advises plans for dc Tuesday. SNF aware- patient plans to go to SNF tomorrow by car [name] to provide transport).   Eduard Clos, MSW, [place]

## 2015-05-13 NOTE — Progress Notes (Signed)
Date: May 13, 2015 Chart reviewed for concurrent status and case management needs. Will continue to follow patient for changes and needs: pna and hyperglycemia with insulin adjustments [name], BSN, [place], [place]   [phone]

## 2015-05-13 NOTE — Clinical Social Work Placement (Signed)
   CLINICAL SOCIAL WORK PLACEMENT  NOTE  Date:  05/13/2015  Patient Details  Name: Hector Sloan MRN: [id number] Date of Birth: April 14, 1940  Clinical Social Work is seeking post-discharge placement for this patient at the [place] level of care (*CSW will initial, date and re-position this form in  chart as items are completed):  Yes   Patient/family provided with [place] Work Department's list of facilities offering this level of care within the geographic area requested by the patient (or if unable, by the patient's family).  Yes   Patient/family informed of their freedom to choose among providers that offer the needed level of care, that participate in Medicare, Medicaid or managed care program needed by the patient, have an available bed and are willing to accept the patient.  Yes   Patient/family informed of [place]'s ownership interest in [facility] and [facility], as well as of the fact that they are under no obligation to receive care at these facilities.  PASRR submitted to EDS on 05/12/15     PASRR number received on 05/12/15     Existing PASRR number confirmed on       FL2 transmitted to all facilities in geographic area requested by pt/family on 05/12/15     FL2 transmitted to all facilities within larger geographic area on       Patient informed that his/her managed care company has contracts with or will negotiate with certain facilities, including the following:        Yes   Patient/family informed of bed offers received.  Patient chooses bed at  [facility])     Physician recommends and patient chooses bed at      Patient to be transferred to   on  .  Patient to be transferred to facility by       Patient family notified on   of transfer.  Name of family member notified:        PHYSICIAN Please sign FL2, Please prepare priority discharge summary, including medications, Please prepare prescriptions      Additional Comment:    _______________________________________________ [name], LCSW 05/13/2015, 12:53 PM

## 2015-05-13 NOTE — Care Management Important Message (Signed)
Important Message  Patient Details  Name: Hector Sloan MRN: [id number] Date of Birth: 1940-06-14   Medicare Important Message Given:  Yes    Camillo Flaming 05/13/2015, 3:01 PMImportant Message  Patient Details  Name: Hector Sloan MRN: [id number] Date of Birth: Aug 04, 1940   Medicare Important Message Given:  Yes    Camillo Flaming 05/13/2015, 3:01 PM

## 2015-05-13 NOTE — Progress Notes (Signed)
OT Cancellation Note  Patient Details Name: Cletis Steven MRN: [id number] DOB: [birth year]   Cancelled Treatment:    Reason Eval/Treat Not Completed: Other (comment) (Noted plan is SNF)  Will defer to SNF for OT eval.  Celesta Funderburk, Thereasa Parkin 05/13/2015, 11:49 AM

## 2015-05-13 NOTE — Progress Notes (Signed)
Pharmacy Antibiotic Note  Hector Sloan is a 74 y.o. male admitted on 05/09/2015 with pneumonia.  Pharmacy was  consulted for Cefepime and Vancomycin dosing 2/9.  Narrowing abx to Levaquin today 2/13  Plan: Levaquin 750mg  q48hr Monitor renal function, SCr elevated but improved May need to reduce next dose Levaquin to 500mg  q48hr  Height: 6\' 1"  (185.4 cm) Weight: 204 lb 2.3 oz (92.6 kg) IBW/kg (Calculated) : 79.9  Temp (24hrs), Avg:98 F (36.7 C), Min:97.8 F (36.6 C), Max:98.3 F (36.8 C)   Recent Labs Lab 05/09/15 1815 05/10/15 0516 05/10/15 0525 05/11/15 0203 05/12/15 0618  WBC 5.8  --  5.2 5.7 5.5  CREATININE 2.43*  --  2.37* 2.33* 2.09*  LATICACIDVEN  --  0.8  --   --   --     Estimated Creatinine Clearance: 34 mL/min (by C-G formula based on Cr of 2.09).    No Known Allergies  Antimicrobials this admission: 2/9 vanc>> 2/13 2/9 cefepime>>2/13 2/13 Levaquin po >>   Microbiology results: Sputum not collected 2/10 strep pneumo ur ag: neg 2/10 legionella ur ag: IP 2/10 influenza panel: neg  Thank you for allowing pharmacy to be a part of this patient's care.  Minda Ditto PharmD Pager [phone] 05/13/2015, 11:12 AM

## 2015-05-13 NOTE — Progress Notes (Addendum)
Patient ID: Hector Sloan, male   DOB: 03-20-1941, 75 y.o.   MRN: [id number] [facility] PROGRESS NOTE  Hector Sloan [id number] DOB: 05-14-1940 DOA: 05/09/2015 PCP: [name], MD  Brief narrative:    75 y.o. male with history of diabetes mellitus, hypertension, left BKA, bladder cancer, chronic kidney disease was recently admitted 3 weeks ago for gout exacerbation and now presented to the ER for evaluation of progressively worsening dyspnea with exertion and at rest associated with productive cough of yellow sputum, fevers, chills, poor oral intake.   Pt was admitted for management of pneumonia.   Assessment/Plan:    Principal Problem:  HCAP (healthcare-associated pneumonia) / Acute respiratory failure with hypoxia / Bilateral pleural effusions - Concern for pneumonia in addition to plural effusions bilaterally - Check CXR today to evaluate pleural effusions - Stop vanco and cefepime, start Levaquin from today  - Influenza negative, legionella pending as of 12/13 - Continue current nebulizer treatments - Stable resp status   Active Problems:  Chest tightness - Secondary to pneumonia  - ECHO on admission EF [id number], grade 1 diastolic dysfunction    Acute renal failure superimposed on stage 3 chronic kidney disease (HCC) - Baseline Cr ~ 2 - Cr improved, 2.33 --> 2.09   Anemia of chronic disease  - Secondary to CKD and malignancy - Hgb stable at 8.2   Uncontrolled DM (diabetes mellitus), type 2 with renal complications and PVD without long term insulin use  - Continue linagliptin, Lantus 5 units daily and SSI - A1c on this admission 9.8 - Stopped Actos 2/12 due to respiratory failure  - CBG's in past 24 hours: 163, 207, 166   Essential hypertension - Continue Norvasc 10 mg daily  - BP 140/63    Dyslipidemia associated with type 2 DM - Continue atorvastatin   DVT Prophylaxis  - Lovenox subQ in hospital   Code Status: Full.  Family  Communication:  plan of care discussed with the patient, caregiver at the bedside  Disposition Plan: Home or SNF likely by 2/14  IV access:  Peripheral IV  Procedures and diagnostic studies:    Dg Chest 2 View 05/09/2015  Patchy consolidations in the bilateral lungs unchanged compared to prior exam consistent with no pneumonia. Bilateral pleural effusions, slightly increased on the right. Electronically Signed   By: Abelardo Diesel M.D.   On: 05/09/2015 18:13   Ct Chest Wo Contrast 05/10/2015  Small pleural collections LEFT larger than RIGHT with densely calcified rims, question related to prior empyema or hemothorax. BILATERAL upper and lower lobe pulmonary infiltrates question pneumonia. Enlarged mediastinal nodes which may be reactive in the setting of pulmonary infiltrates. BILATERAL pleural effusions larger on RIGHT, with RIGHT demonstrating loculation. Extensive atherosclerotic disease. Due to presence of multiple parenchymal abnormalities, consider followup CT imaging in 3 months to ensure resolution of areas of lung opacity particularly in the RIGHT upper and LEFT lower lobes and exclude underlying tumor. Electronically Signed   By: Lavonia Dana M.D.   On: 05/10/2015 10:29   Dg Chest Port 1 View 05/10/2015  Loculated pleural fluid collections bilaterally. Scattered pleural calcifications. Enlargement of cardiac silhouette. Scattered pulmonary infiltrates. Electronically Signed   By: Lavonia Dana M.D.   On: 05/10/2015 14:38   Medical Consultants:  None   IAnti-Infectives:   Vancomycin 2/9 --> 12/13 Cefepime 2/9 --> 12/13 Levaquin 12/13 -->   Hector Sloan, Dedra Skeens, MD  [facility] Pager [phone]  Time spent in minutes: 25 minutes  If 7PM-7AM, please contact night-coverage  [email or web address] Password [email or web address] 05/13/2015, 10:50 AM   LOS: 4 days    HPI/Subjective: No acute overnight events. Patient reports no respiratory distress.   Objective: Filed Vitals:   05/12/15 2116 05/13/15 0205  05/13/15 0536 05/13/15 0942  BP: 126/57  145/77 140/63  Pulse: 101  95 99  Temp: 98.3 F (36.8 C)  97.8 F (36.6 C) 98 F (36.7 C)  TempSrc: Oral  Oral Oral  Resp: 20  20 20   Height:      Weight:   92.6 kg (204 lb 2.3 oz)   SpO2: 96% 95% 99% 99%    Intake/Output Summary (Last 24 hours) at 05/13/15 1050 Last data filed at 05/13/15 1002  Gross per 24 hour  Intake   1400 ml  Output   1150 ml  Net    250 ml    Exam:   General:  Pt is alert, not in acute distress  Cardiovascular: RRR, S1/S2 appreciated   Respiratory: no wheezing, no rhonchi   Abdomen: (+) BS, non tender   Extremities: Left BKA, no swelling   Neuro: Nonfocal  Data Reviewed: Basic Metabolic Panel:  Recent Labs Lab 05/09/15 1815 05/10/15 0525 05/11/15 0203 05/12/15 0618  NA 137 139 136 137  K 4.6 4.8 4.4 4.3  CL 103 105 103 103  CO2 22 25 25 25   GLUCOSE 455* 368* 238* 202*  BUN 38* 39* 39* 35*  CREATININE 2.43* 2.37* 2.33* 2.09*  CALCIUM 9.1 8.6* 8.6* 8.9   Liver Function Tests:  Recent Labs Lab 05/10/15 0525  AST 14*  ALT 11*  ALKPHOS 63  BILITOT 0.6  PROT 6.9  ALBUMIN 2.6*   No results for input(s): LIPASE, AMYLASE in the last 168 hours. No results for input(s): AMMONIA in the last 168 hours. CBC:  Recent Labs Lab 05/09/15 1815 05/10/15 0525 05/11/15 0203 05/12/15 0618  WBC 5.8 5.2 5.7 5.5  NEUTROABS 4.8  --   --   --   HGB 8.5* 7.3* 7.5* 8.2*  HCT 26.0* 22.3* 23.2* 25.4*  MCV 82.8 83.2 83.8 83.6  PLT 311 244 271 296   Cardiac Enzymes:  Recent Labs Lab 05/10/15 0525 05/10/15 1407 05/10/15 2003 05/11/15 0203  TROPONINI <0.03 <0.03 <0.03 <0.03   BNP: Invalid input(s): POCBNP CBG:  Recent Labs Lab 05/12/15 0737 05/12/15 1149 05/12/15 1619 05/12/15 2155 05/13/15 0743  GLUCAP 189* 341* 163* 207* 166*     No results found for this or any previous visit (from the past 240 hour(s)).   Marland Kitchen amLODipine  10 mg Oral Daily  . atorvastatin  20 mg Oral Daily  .  brimonidine  1 drop Left Eye Q12H  . ceFEPime (MAXIPIME) IV  2 g Intravenous Q24H  . colchicine  0.6 mg Oral Daily  . enoxaparin (LOVENOX) injection  40 mg Subcutaneous QHS  . insulin aspart  0-15 Units Subcutaneous TID WC  . insulin glargine  5 Units Subcutaneous Daily  . latanoprost  1 drop Both Eyes QHS  . levalbuterol  0.63 mg Nebulization Q6H  . linagliptin  5 mg Oral Daily  . pantoprazole  40 mg Oral Daily  . timolol  1 drop Both Eyes BID  . vancomycin  1,000 mg Intravenous Q24H

## 2015-05-14 LAB — GLUCOSE, CAPILLARY
GLUCOSE-CAPILLARY: 148 mg/dL — AB (ref 65–99)
Glucose-Capillary: 247 mg/dL — ABNORMAL HIGH (ref 65–99)

## 2015-05-14 MED ORDER — LINAGLIPTIN 5 MG PO TABS: 5.0000 mg | ORAL_TABLET | Freq: Every day | ORAL | Status: AC

## 2015-05-14 MED ORDER — ONDANSETRON HCL 4 MG PO TABS: 4.0000 mg | ORAL_TABLET | Freq: Four times a day (QID) | ORAL | Status: DC | PRN

## 2015-05-14 MED ORDER — ACETAMINOPHEN 325 MG PO TABS: 650.0000 mg | ORAL_TABLET | Freq: Four times a day (QID) | ORAL | Status: AC | PRN

## 2015-05-14 MED ORDER — LEVOFLOXACIN 750 MG PO TABS: 750.0000 mg | ORAL_TABLET | ORAL | Status: DC

## 2015-05-14 MED ORDER — LEVALBUTEROL HCL 0.63 MG/3ML IN NEBU: 0.6300 mg | INHALATION_SOLUTION | Freq: Four times a day (QID) | RESPIRATORY_TRACT | Status: AC | PRN

## 2015-05-14 NOTE — Discharge Summary (Signed)
Physician Discharge Summary  Hector Sloan [id number] DOB: Oct 23, 1940 DOA: 05/09/2015  PCP: [name], MD  Admit date: 05/09/2015 Discharge date: 05/14/2015  Recommendations for Outpatient Follow-up:  Take Levaquin every other day for 4 more doses on discharge for pneumonia  We stopped Kombiglyze and Hyzaar due to renal insufficiency We stopped Actos due to respiratory related issues such as pleural effusion and pneumonia  Discharge Diagnoses:  Principal Problem:   HCAP (healthcare-associated pneumonia) Active Problems:   Acute renal failure superimposed on stage 3 chronic kidney disease (HCC)   Normocytic anemia   DM (diabetes mellitus), type 2 with renal complications ([place])   Discharge Condition: stable   Diet recommendation: as tolerated   History of present illness:  75 y.o. male with history of diabetes mellitus, hypertension, left BKA, bladder cancer, chronic kidney disease was recently admitted 3 weeks ago for gout exacerbation and now presented to the ER for evaluation of progressively worsening dyspnea with exertion and at rest associated with productive cough of yellow sputum, fevers, chills, poor oral intake.   Pt was admitted for management of pneumonia.   Hospital Course:   Assessment/Plan:    Principal Problem:  HCAP (healthcare-associated pneumonia) / Acute respiratory failure with hypoxia / Bilateral pleural effusions - Concern for pneumonia in addition to plural effusions bilaterally - Stopped vanco and cefepime 2/13, started Levaquin 2/13 and he will continue for 4 more doses on discharge  - Influenza negative, legionella negative  - Stable resp status   Active Problems:  Chest tightness - Secondary to pneumonia  - ECHO on admission EF [id number], grade 1 diastolic dysfunction    Acute renal failure superimposed on stage 3 chronic kidney disease (HCC) - Baseline Cr ~ 2 - Cr improved, 2.33 --> 2.09   Anemia of chronic disease  -  Secondary to CKD and malignancy - Hgb stable   Uncontrolled DM (diabetes mellitus), type 2 with renal complications and PVD without long term insulin use  - Continue linagliptin - A1c on this admission 9.8 - Stopped Actos 2/12 due to respiratory failure  - Stopped Kombiglyze due to renal insufficiency    Essential hypertension - Continue Norvasc 10 mg daily    Dyslipidemia associated with type 2 DM - Continue atorvastatin   DVT Prophylaxis  - Lovenox subQ in hospital   Code Status: Full.  Family Communication: plan of care discussed with the patient, caregiver at the bedside    IV access:  Peripheral IV  Procedures and diagnostic studies:   Dg Chest 2 View 05/09/2015 Patchy consolidations in the bilateral lungs unchanged compared to prior exam consistent with no pneumonia. Bilateral pleural effusions, slightly increased on the right. Electronically Signed By: Abelardo Diesel M.D. On: 05/09/2015 18:13   Ct Chest Wo Contrast 05/10/2015 Small pleural collections LEFT larger than RIGHT with densely calcified rims, question related to prior empyema or hemothorax. BILATERAL upper and lower lobe pulmonary infiltrates question pneumonia. Enlarged mediastinal nodes which may be reactive in the setting of pulmonary infiltrates. BILATERAL pleural effusions larger on RIGHT, with RIGHT demonstrating loculation. Extensive atherosclerotic disease. Due to presence of multiple parenchymal abnormalities, consider followup CT imaging in 3 months to ensure resolution of areas of lung opacity particularly in the RIGHT upper and LEFT lower lobes and exclude underlying tumor. Electronically Signed By: Lavonia Dana M.D. On: 05/10/2015 10:29   Dg Chest Port 1 View 05/10/2015 Loculated pleural fluid collections bilaterally. Scattered pleural calcifications. Enlargement of cardiac silhouette. Scattered pulmonary infiltrates. Electronically Signed By: Lavonia Dana M.D. On:  05/10/2015 14:38    Medical Consultants:  None   IAnti-Infectives:   Vancomycin 2/9 --> 12/13 Cefepime 2/9 --> 12/13 Levaquin 12/13 --> for 4 more doses on discharge   Signed:  Leisa Lenz, MD  [facility] 05/14/2015, 10:47 AM  Pager #: [phone]  Time spent in minutes: more than 30 minutes  Discharge Exam: Filed Vitals:   05/13/15 2115 05/14/15 0530  BP: 139/62 149/66  Pulse: 101 102  Temp: 98.1 F (36.7 C) 98.1 F (36.7 C)  Resp: 20 20   Filed Vitals:   05/13/15 2115 05/14/15 0454 05/14/15 0530 05/14/15 0911  BP: 139/62  149/66   Pulse: 101  102   Temp: 98.1 F (36.7 C)  98.1 F (36.7 C)   TempSrc: Oral  Oral   Resp: 20  20   Height:      Weight:  92.4 kg (203 lb 11.3 oz)    SpO2: 99%  94% 99%    General: Pt is alert, follows commands appropriately, not in acute distress Cardiovascular: Regular rate and rhythm, S1/S2 + Respiratory: Clear to auscultation bilaterally, no wheezing, no crackles, no rhonchi Abdominal: Soft, non tender, non distended, bowel sounds +, no guarding Extremities: no edema, no cyanosis, pulses palpable bilaterally DP and PT Neuro: Grossly nonfocal  Discharge Instructions  Discharge Instructions    Call MD for:  difficulty breathing, headache or visual disturbances    Complete by:  As directed      Call MD for:  persistant dizziness or light-headedness    Complete by:  As directed      Call MD for:  persistant nausea and vomiting    Complete by:  As directed      Call MD for:  severe uncontrolled pain    Complete by:  As directed      Diet - low sodium heart healthy    Complete by:  As directed      Discharge instructions    Complete by:  As directed   Take Levaquin every other day for 4 more doses on discharge for pneumonia  We stopped Kombiglyze and Hyzaar due to renal insufficiency We stopped Actos due to respiratory related issues such as pleural effusion and pneumonia     Increase activity slowly    Complete by:  As  directed             Medication List    STOP taking these medications        amoxicillin-clavulanate 875-125 MG tablet  Commonly known as:  AUGMENTIN     HYDROcodone-acetaminophen 5-325 MG tablet  Commonly known as:  NORCO     KOMBIGLYZE XR 5-500 MG Tb24  Generic drug:  Saxagliptin-Metformin     losartan-hydrochlorothiazide 100-25 MG tablet  Commonly known as:  HYZAAR     pioglitazone 30 MG tablet  Commonly known as:  ACTOS      TAKE these medications        acetaminophen 325 MG tablet  Commonly known as:  TYLENOL  Take 2 tablets (650 mg total) by mouth every 6 (six) hours as needed for mild pain (or Fever >/= 101).     amLODipine 10 MG tablet  Commonly known as:  NORVASC  Take 1 tablet (10 mg total) by mouth daily.     atorvastatin 20 MG tablet  Commonly known as:  LIPITOR  Take 20 mg by mouth daily.     brimonidine 0.2 % ophthalmic solution  Commonly known as:  ALPHAGAN  Place 1 drop into the left eye every 12 (twelve) hours.     colchicine 0.6 MG tablet  Take 1 tablet (0.6 mg total) by mouth daily.     FLUZONE HIGH-DOSE 0.5 ML Susy  Generic drug:  Influenza Vac Split High-Dose  Inject 1 Dose as directed once.     latanoprost 0.005 % ophthalmic solution  Commonly known as:  XALATAN  Place 1 drop into both eyes at bedtime.     levalbuterol 0.63 MG/3ML nebulizer solution  Commonly known as:  XOPENEX  Take 3 mLs (0.63 mg total) by nebulization every 6 (six) hours as needed for wheezing or shortness of breath.     levofloxacin 750 MG tablet  Commonly known as:  LEVAQUIN  Take 1 tablet (750 mg total) by mouth every other day.     linagliptin 5 MG Tabs tablet  Commonly known as:  TRADJENTA  Take 1 tablet (5 mg total) by mouth daily.     omeprazole 20 MG capsule  Commonly known as:  PRILOSEC  Take 20 mg by mouth daily.     ondansetron 4 MG tablet  Commonly known as:  ZOFRAN  Take 1 tablet (4 mg total) by mouth every 6 (six) hours as needed for  nausea.     SYSTANE OP  Apply 1 drop to eye 2 (two) times daily.     timolol 0.5 % ophthalmic solution  Commonly known as:  BETIMOL  Place 1 drop into both eyes 2 (two) times daily.           Follow-up Information    Follow up with [name], MD. Schedule an appointment as soon as possible for a visit in 1 week.   Specialty:  Internal Medicine   Why:  Follow up appt after recent hospitalization   Contact information:   [place] [address] [phone]        The results of significant diagnostics from this hospitalization (including imaging, microbiology, ancillary and laboratory) are listed below for reference.    Significant Diagnostic Studies: Dg Chest 2 View  05/09/2015  CLINICAL DATA:  Increased shortness of breath. Patient was diagnosed with pneumonia last week. EXAM: CHEST  2 VIEW COMPARISON:  May 07, 2015 FINDINGS: The heart size and mediastinal contours are stable. Patchy consolidation of bilateral lung lungs involving the right upper lobe, right mid and lung base, left mid and lung base, are unchanged. Bilateral pleural effusion are identified slightly increased on the right. The visualized skeletal structures are stable. IMPRESSION: Patchy consolidations in the bilateral lungs unchanged compared to prior exam consistent with no pneumonia. Bilateral pleural effusions, slightly increased on the right. Electronically Signed   By: Abelardo Diesel M.D.   On: 05/09/2015 18:13   Dg Chest 2 View  05/07/2015  CLINICAL DATA:  Cough and shortness of breath and wheezing for the past several weeks, history of acute and chronic renal failure, former smoker. EXAM: CHEST  2 VIEW COMPARISON:  PA and lateral chest x-ray of April 16, 2015 FINDINGS: The lungs are adequately inflated. Confluent alveolar opacities are present bilaterally consistent with pneumonia. There is chronic pleural thickening or scarring at the left lung base. The cardiac  silhouette is mildly enlarged. The pulmonary vascularity is mildly prominent centrally without definite cephalization. No significant pleural effusion is observed. The bony thorax exhibits no acute abnormality. IMPRESSION: Bilateral pneumonia superimposed upon chronic pleuroparenchymal fibrotic changes. No definite CHF. Followup PA and lateral chest X-ray is recommended in  3-4 weeks following trial of antibiotic therapy to ensure resolution and exclude underlying malignancy. Electronically Signed   By: David  Martinique M.D.   On: 05/07/2015 12:45   Dg Chest 2 View  04/16/2015  CLINICAL DATA:  Sepsis EXAM: CHEST  2 VIEW COMPARISON:  03/12/2010 FINDINGS: Cardiomediastinal silhouette is stable. Stable chronic pleural parenchymal scarring and pleural calcifications left lower hemi thorax. No definite superimposed infiltrate or pulmonary edema. Mild degenerative changes thoracic spine. IMPRESSION: Stable chronic pleural parenchymal scarring and pleural calcifications left lower hemi thorax. No definite superimposed infiltrate or pulmonary edema. Mild degenerative changes thoracic spine. Electronically Signed   By: Lahoma Crocker M.D.   On: 04/16/2015 16:09   Dg Elbow Complete Left  04/16/2015  CLINICAL DATA:  Pt from home. Pt had a fall on Sunday. Was seen for this and no injury noted after fall. Today pt complains of bilateral elbow pain. Hx of gout in R elbow, L elbow pain new. Pt also complains of worsening R knee pain. EXAM: LEFT ELBOW - COMPLETE 3+ VIEW COMPARISON:  None. FINDINGS: No convincing fracture. No dislocation. There is some subchondral cystic-type change along the capitellum. An enthesiophyte is noted at the insertion of the triceps tendon. Possible small joint effusion. Soft tissues are unremarkable. IMPRESSION: 1. No fracture or dislocation. 2. Mild arthropathic changes.  Possible small joint effusion. Electronically Signed   By: Lajean Manes M.D.   On: 04/16/2015 16:03   Dg Elbow Complete  Right  04/16/2015  CLINICAL DATA:  Fall on Sunday. Bilateral elbow pain reported today. History of gout. Initial encounter. EXAM: RIGHT ELBOW - COMPLETE 3+ VIEW COMPARISON:  None. FINDINGS: Large elbow joint effusion without visible fracture or dislocation. No erosive changes. Mild marginal spurring about the elbow without joint narrowing. IMPRESSION: Joint effusion without acute osseous finding. If there is preceding trauma recommend close follow-up for occult radial head fracture. Electronically Signed   By: Monte Fantasia M.D.   On: 04/16/2015 16:03   Ct Chest Wo Contrast  05/10/2015  CLINICAL DATA:  Shortness of breath, wheezing, and productive cough for 5 days, type II diabetes mellitus, hypertension, former smoker, history bladder cancer EXAM: CT CHEST WITHOUT CONTRAST TECHNIQUE: Multidetector CT imaging of the chest was performed following the standard protocol without IV contrast. Sagittal and coronal MPR images reconstructed from axial data set. COMPARISON:  None; correlation chest radiograph 02/29/2017 FINDINGS: Scattered respiratory motion artifacts. Extensive atherosclerotic calcifications aorta and coronary arteries. Aorta normal caliber. Cyst at upper pole LEFT kidney 2.8 x 2.3 cm image 50. Question additional cyst image 56 LEFT kidney, incompletely visualized, question 17 mm diameter. Remaining visualized upper abdomen unremarkable. Mediastinal adenopathy: 12 mm short axis 4R mediastinal node image 19. 13 mm short axis 4R mediastinal node image 21. BILATERAL pleural fluid collections with densely calcified rims at lung bases greater on LEFT. RIGHT pleural effusion which appears to be at least partially loculated, including fluid within the major and minor fissures. Probable small loculated LEFT pleural effusion. No acute osseous findings. Infiltrates are identified in the upper and lower lobes bilaterally, question infection. Scattered areas of atelectasis versus scarring in the lower lobes  bilaterally. No definite discrete pulmonary mass identified though small masses/nodules could be obscured. No pneumothorax. Few blebs at anterior RIGHT upper lobe. IMPRESSION: Small pleural collections LEFT larger than RIGHT with densely calcified rims, question related to prior empyema or hemothorax. BILATERAL upper and lower lobe pulmonary infiltrates question pneumonia. Enlarged mediastinal nodes which may be reactive in the setting of  pulmonary infiltrates. BILATERAL pleural effusions larger on RIGHT, with RIGHT demonstrating loculation. Extensive atherosclerotic disease. Due to presence of multiple parenchymal abnormalities, consider followup CT imaging in 3 months to ensure resolution of areas of lung opacity particularly in the RIGHT upper and LEFT lower lobes and exclude underlying tumor. Electronically Signed   By: Lavonia Dana M.D.   On: 05/10/2015 10:29   Dg Chest Port 1 View  05/13/2015  CLINICAL DATA:  Pleural effusion, hypertension, diabetes mellitus, bladder cancer, former smoker EXAM: PORTABLE CHEST 1 VIEW COMPARISON:  Portable exam 1142 hours compared to 05/10/2015 FINDINGS: Enlargement of cardiac silhouette. Calcified tortuous thoracic aorta. Persistent opacities in both lungs corresponding to loculated pleural fluid collections on prior CT. Pleural calcifications at the inferior hemithoraces bilaterally more obvious on LEFT. Airspace infiltrates in the upper lobes are again identified. Increased infiltrate in lower LEFT lung. No pneumothorax. Bones demineralized. IMPRESSION: Persistent BILATERAL pleural effusions, partially loculated. Persistent airspace infiltrates in both upper lobes and increased at LEFT base. Electronically Signed   By: Lavonia Dana M.D.   On: 05/13/2015 12:28   Dg Chest Port 1 View  05/10/2015  CLINICAL DATA:  Increased dyspnea today, hypertension, diabetes mellitus, bladder cancer, former smoker EXAM: PORTABLE CHEST 1 VIEW COMPARISON:  Portable exam 1429 hours compared  to chest CT of 05/10/2015 FINDINGS: Enlargement of cardiac silhouette. Atherosclerotic calcification aorta. Pleural calcifications bilaterally. Patchy airspace infiltrates in both lungs with loculated pleural fluid collections both better visualized by preceding CT. No pneumothorax. Bones demineralized. IMPRESSION: Loculated pleural fluid collections bilaterally. Scattered pleural calcifications. Enlargement of cardiac silhouette. Scattered pulmonary infiltrates. Electronically Signed   By: Lavonia Dana M.D.   On: 05/10/2015 14:38   Dg Knee Complete 4 Views Right  04/16/2015  CLINICAL DATA:  fall on Sunday. Was seen for this and no injury noted after fall. Today pt complains of bilateral elbow pain. Hx of gout in R elbow, L elbow pain new. Pt also complains of worsening R knee pain. Pain with extension and bearing weight. Hx HTN. Diabetic. Former smoker. EXAM: RIGHT KNEE - COMPLETE 4+ VIEW COMPARISON:  None. FINDINGS: There is no evidence of fracture, dislocation. Small effusion in the suprapatellar bursa. Small marginal spurs from the patellar articular surface and medial femoral condyles. There is no other evidence of arthropathy or other focal bone abnormality. Tibial arterial calcifications are noted. Soft tissues are unremarkable. IMPRESSION: 1. Negative for fracture or other acute bone abnormality. 2. Mild patellar and medial compartment degenerative spurring, with small effusion. Electronically Signed   By: Lucrezia Europe M.D.   On: 04/16/2015 16:03    Microbiology: No results found for this or any previous visit (from the past 240 hour(s)).   Labs: Basic Metabolic Panel:  Recent Labs Lab 05/09/15 1815 05/10/15 0525 05/11/15 0203 05/12/15 0618  NA 137 139 136 137  K 4.6 4.8 4.4 4.3  CL 103 105 103 103  CO2 22 25 25 25   GLUCOSE 455* 368* 238* 202*  BUN 38* 39* 39* 35*  CREATININE 2.43* 2.37* 2.33* 2.09*  CALCIUM 9.1 8.6* 8.6* 8.9   Liver Function Tests:  Recent Labs Lab 05/10/15 0525   AST 14*  ALT 11*  ALKPHOS 63  BILITOT 0.6  PROT 6.9  ALBUMIN 2.6*   No results for input(s): LIPASE, AMYLASE in the last 168 hours. No results for input(s): AMMONIA in the last 168 hours. CBC:  Recent Labs Lab 05/09/15 1815 05/10/15 0525 05/11/15 0203 05/12/15 0618  WBC 5.8 5.2 5.7 5.5  NEUTROABS 4.8  --   --   --   HGB 8.5* 7.3* 7.5* 8.2*  HCT 26.0* 22.3* 23.2* 25.4*  MCV 82.8 83.2 83.8 83.6  PLT 311 244 271 296   Cardiac Enzymes:  Recent Labs Lab 05/10/15 0525 05/10/15 1407 05/10/15 2003 05/11/15 0203  TROPONINI <0.03 <0.03 <0.03 <0.03   BNP: BNP (last 3 results)  Recent Labs  04/16/15 2113 05/09/15 1815 05/11/15 0203  BNP 128.9* 132.0* 95.2    ProBNP (last 3 results) No results for input(s): PROBNP in the last 8760 hours.  CBG:  Recent Labs Lab 05/13/15 0743 05/13/15 1149 05/13/15 1648 05/13/15 2057 05/14/15 0736  GLUCAP 166* 314* 183* 205* 148*

## 2015-05-14 NOTE — Discharge Instructions (Signed)

## 2015-05-14 NOTE — Progress Notes (Signed)
Pt for discharge to [facility].   CSW facilitated pt discharge needs including contacting facility, faxing pt discharge information via epic hub, discussing with pt and pt friend/caregiver, [name] at bedside, providing RN phone number to call report, and providing discharge packet to pt and pt friend/caregiver to provide to [employer] upon arrival.  Pt is adamant to transfer via private vehicle. Discussed benefits of PTAR for safety, but pt remains adamant that he wants to transport via private vehicle and friend/caregiver, [name] agreeable to transport. CSW notified [facility] that pt coming by private vehicle in order for facility to assist pt upon arrival.  No further social work needs identified at this time.  CSW signing off.   Alison Murray, MSW, LCSW Clinical Social Work 5E coverage [phone]

## 2015-05-14 NOTE — Clinical Social Work Placement (Signed)
   CLINICAL SOCIAL WORK PLACEMENT  NOTE  Date:  05/14/2015  Patient Details  Name: Hector Sloan MRN: [id number] Date of Birth: 1940/07/09  Clinical Social Work is seeking post-discharge placement for this patient at the [place] level of care (*CSW will initial, date and re-position this form in  chart as items are completed):  Yes   Patient/family provided with [facility] Work Department's list of facilities offering this level of care within the geographic area requested by the patient (or if unable, by the patient's family).  Yes   Patient/family informed of their freedom to choose among providers that offer the needed level of care, that participate in Medicare, Medicaid or managed care program needed by the patient, have an available bed and are willing to accept the patient.  Yes   Patient/family informed of [facility]'s ownership interest in [facility] and [facility], as well as of the fact that they are under no obligation to receive care at these facilities.  PASRR submitted to EDS on 05/12/15     PASRR number received on 05/12/15     Existing PASRR number confirmed on       FL2 transmitted to all facilities in geographic area requested by pt/family on 05/12/15     FL2 transmitted to all facilities within larger geographic area on       Patient informed that his/her managed care company has contracts with or will negotiate with certain facilities, including the following:        Yes   Patient/family informed of bed offers received.  Patient chooses bed at  [facility])     Physician recommends and patient chooses bed at      Patient to be transferred to [facility] on 05/14/15.  Patient to be transferred to facility by pt friend/caregiver, [name] via private vehicle     Patient family notified on 05/14/15 of transfer.  Name of family member notified:  pt notified at bedside     PHYSICIAN Please sign FL2,  Please prepare priority discharge summary, including medications, Please prepare prescriptions     Additional Comment:    _______________________________________________ Ladell Pier, LCSW 05/14/2015, 2:36 PM

## 2015-05-14 NOTE — Progress Notes (Signed)
Hector Sloan to be D/C'd Skilled nursing facility per MD order.  Discussed prescriptions and follow up appointments with the patient. Prescriptions given to patient, medication list explained in detail. Pt verbalized understanding.    Medication List    STOP taking these medications        amoxicillin-clavulanate 875-125 MG tablet  Commonly known as:  AUGMENTIN     HYDROcodone-acetaminophen 5-325 MG tablet  Commonly known as:  NORCO     KOMBIGLYZE XR 5-500 MG Tb24  Generic drug:  Saxagliptin-Metformin     losartan-hydrochlorothiazide 100-25 MG tablet  Commonly known as:  HYZAAR     pioglitazone 30 MG tablet  Commonly known as:  ACTOS      TAKE these medications        acetaminophen 325 MG tablet  Commonly known as:  TYLENOL  Take 2 tablets (650 mg total) by mouth every 6 (six) hours as needed for mild pain (or Fever >/= 101).     amLODipine 10 MG tablet  Commonly known as:  NORVASC  Take 1 tablet (10 mg total) by mouth daily.     atorvastatin 20 MG tablet  Commonly known as:  LIPITOR  Take 20 mg by mouth daily.     brimonidine 0.2 % ophthalmic solution  Commonly known as:  ALPHAGAN  Place 1 drop into the left eye every 12 (twelve) hours.     colchicine 0.6 MG tablet  Take 1 tablet (0.6 mg total) by mouth daily.     FLUZONE HIGH-DOSE 0.5 ML Susy  Generic drug:  Influenza Vac Split High-Dose  Inject 1 Dose as directed once.     latanoprost 0.005 % ophthalmic solution  Commonly known as:  XALATAN  Place 1 drop into both eyes at bedtime.     levalbuterol 0.63 MG/3ML nebulizer solution  Commonly known as:  XOPENEX  Take 3 mLs (0.63 mg total) by nebulization every 6 (six) hours as needed for wheezing or shortness of breath.     levofloxacin 750 MG tablet  Commonly known as:  LEVAQUIN  Take 1 tablet (750 mg total) by mouth every other day.     linagliptin 5 MG Tabs tablet  Commonly known as:  TRADJENTA  Take 1 tablet (5 mg total) by mouth daily.     omeprazole 20 MG capsule  Commonly known as:  PRILOSEC  Take 20 mg by mouth daily.     ondansetron 4 MG tablet  Commonly known as:  ZOFRAN  Take 1 tablet (4 mg total) by mouth every 6 (six) hours as needed for nausea.     SYSTANE OP  Apply 1 drop to eye 2 (two) times daily.     timolol 0.5 % ophthalmic solution  Commonly known as:  BETIMOL  Place 1 drop into both eyes 2 (two) times daily.        Filed Vitals:   05/14/15 0530 05/14/15 1130  BP: 149/66   Pulse: 102 104  Temp: 98.1 F (36.7 C)   Resp: 20     Skin clean, dry and intact without evidence of skin break down, no evidence of skin tears noted. IV catheter discontinued intact. Site without signs and symptoms of complications. Dressing and pressure applied. Pt denies pain at this time. No complaints noted.  An After Visit Summary was printed and given to the patient. Patient escorted via [place], and D/C home via private auto.  Nonie Hoyer S 05/14/2015 2:20 PM

## 2015-05-14 NOTE — Progress Notes (Signed)
PT Cancellation Note  Patient Details Name: Hector Sloan MRN: [id number] DOB: 1940/11/07   Cancelled Treatment:    Reason Eval/Treat Not Completed: Patient declined, no reason specified (pt declined PT today, stated he wants to wait until he gets to SNF later today. )   [name] 05/14/2015, 11:53 AM [phone]

## 2015-07-08 ENCOUNTER — Inpatient Hospital Stay (HOSPITAL_COMMUNITY): Payer: Medicare Other

## 2015-07-08 ENCOUNTER — Emergency Department (HOSPITAL_COMMUNITY): Payer: Medicare Other

## 2015-07-08 ENCOUNTER — Inpatient Hospital Stay (HOSPITAL_COMMUNITY)
Admission: EM | Admit: 2015-07-08 | Discharge: 2015-07-29 | DRG: 193 | Disposition: E | Payer: Medicare Other | Attending: Internal Medicine | Admitting: Internal Medicine

## 2015-07-08 ENCOUNTER — Encounter (HOSPITAL_COMMUNITY): Payer: Self-pay | Admitting: *Deleted

## 2015-07-08 DIAGNOSIS — J929 Pleural plaque without asbestos: Secondary | ICD-10-CM | POA: Diagnosis not present

## 2015-07-08 DIAGNOSIS — E11319 Type 2 diabetes mellitus with unspecified diabetic retinopathy without macular edema: Secondary | ICD-10-CM | POA: Diagnosis present

## 2015-07-08 DIAGNOSIS — N189 Chronic kidney disease, unspecified: Secondary | ICD-10-CM | POA: Diagnosis present

## 2015-07-08 DIAGNOSIS — H409 Unspecified glaucoma: Secondary | ICD-10-CM | POA: Diagnosis present

## 2015-07-08 DIAGNOSIS — F329 Major depressive disorder, single episode, unspecified: Secondary | ICD-10-CM | POA: Diagnosis present

## 2015-07-08 DIAGNOSIS — Z8551 Personal history of malignant neoplasm of bladder: Secondary | ICD-10-CM

## 2015-07-08 DIAGNOSIS — N179 Acute kidney failure, unspecified: Secondary | ICD-10-CM | POA: Diagnosis present

## 2015-07-08 DIAGNOSIS — E1165 Type 2 diabetes mellitus with hyperglycemia: Secondary | ICD-10-CM | POA: Diagnosis present

## 2015-07-08 DIAGNOSIS — J189 Pneumonia, unspecified organism: Secondary | ICD-10-CM | POA: Diagnosis present

## 2015-07-08 DIAGNOSIS — R4189 Other symptoms and signs involving cognitive functions and awareness: Secondary | ICD-10-CM | POA: Diagnosis present

## 2015-07-08 DIAGNOSIS — R600 Localized edema: Secondary | ICD-10-CM | POA: Diagnosis not present

## 2015-07-08 DIAGNOSIS — Z87891 Personal history of nicotine dependence: Secondary | ICD-10-CM

## 2015-07-08 DIAGNOSIS — R0600 Dyspnea, unspecified: Secondary | ICD-10-CM | POA: Diagnosis not present

## 2015-07-08 DIAGNOSIS — C459 Mesothelioma, unspecified: Secondary | ICD-10-CM | POA: Diagnosis present

## 2015-07-08 DIAGNOSIS — D631 Anemia in chronic kidney disease: Secondary | ICD-10-CM | POA: Diagnosis present

## 2015-07-08 DIAGNOSIS — Z22322 Carrier or suspected carrier of Methicillin resistant Staphylococcus aureus: Secondary | ICD-10-CM | POA: Diagnosis not present

## 2015-07-08 DIAGNOSIS — Z89512 Acquired absence of left leg below knee: Secondary | ICD-10-CM

## 2015-07-08 DIAGNOSIS — Z833 Family history of diabetes mellitus: Secondary | ICD-10-CM | POA: Diagnosis not present

## 2015-07-08 DIAGNOSIS — I519 Heart disease, unspecified: Secondary | ICD-10-CM | POA: Diagnosis present

## 2015-07-08 DIAGNOSIS — I5189 Other ill-defined heart diseases: Secondary | ICD-10-CM | POA: Diagnosis present

## 2015-07-08 DIAGNOSIS — I130 Hypertensive heart and chronic kidney disease with heart failure and stage 1 through stage 4 chronic kidney disease, or unspecified chronic kidney disease: Secondary | ICD-10-CM | POA: Diagnosis present

## 2015-07-08 DIAGNOSIS — I272 Other secondary pulmonary hypertension: Secondary | ICD-10-CM | POA: Diagnosis present

## 2015-07-08 DIAGNOSIS — Z794 Long term (current) use of insulin: Secondary | ICD-10-CM | POA: Diagnosis not present

## 2015-07-08 DIAGNOSIS — N183 Chronic kidney disease, stage 3 (moderate): Secondary | ICD-10-CM | POA: Diagnosis not present

## 2015-07-08 DIAGNOSIS — Z66 Do not resuscitate: Secondary | ICD-10-CM | POA: Diagnosis not present

## 2015-07-08 DIAGNOSIS — Z515 Encounter for palliative care: Secondary | ICD-10-CM | POA: Diagnosis not present

## 2015-07-08 DIAGNOSIS — E118 Type 2 diabetes mellitus with unspecified complications: Secondary | ICD-10-CM | POA: Diagnosis not present

## 2015-07-08 DIAGNOSIS — Z79899 Other long term (current) drug therapy: Secondary | ICD-10-CM | POA: Diagnosis not present

## 2015-07-08 DIAGNOSIS — I10 Essential (primary) hypertension: Secondary | ICD-10-CM | POA: Diagnosis present

## 2015-07-08 DIAGNOSIS — I739 Peripheral vascular disease, unspecified: Secondary | ICD-10-CM | POA: Diagnosis present

## 2015-07-08 DIAGNOSIS — J9602 Acute respiratory failure with hypercapnia: Secondary | ICD-10-CM | POA: Diagnosis present

## 2015-07-08 DIAGNOSIS — E1122 Type 2 diabetes mellitus with diabetic chronic kidney disease: Secondary | ICD-10-CM | POA: Insufficient documentation

## 2015-07-08 DIAGNOSIS — J9610 Chronic respiratory failure, unspecified whether with hypoxia or hypercapnia: Secondary | ICD-10-CM | POA: Diagnosis present

## 2015-07-08 DIAGNOSIS — I5032 Chronic diastolic (congestive) heart failure: Secondary | ICD-10-CM | POA: Diagnosis present

## 2015-07-08 DIAGNOSIS — F419 Anxiety disorder, unspecified: Secondary | ICD-10-CM | POA: Diagnosis present

## 2015-07-08 DIAGNOSIS — J9601 Acute respiratory failure with hypoxia: Secondary | ICD-10-CM | POA: Diagnosis present

## 2015-07-08 DIAGNOSIS — E1121 Type 2 diabetes mellitus with diabetic nephropathy: Secondary | ICD-10-CM | POA: Diagnosis not present

## 2015-07-08 DIAGNOSIS — N184 Chronic kidney disease, stage 4 (severe): Secondary | ICD-10-CM | POA: Diagnosis present

## 2015-07-08 DIAGNOSIS — Y95 Nosocomial condition: Secondary | ICD-10-CM | POA: Diagnosis present

## 2015-07-08 DIAGNOSIS — R0689 Other abnormalities of breathing: Secondary | ICD-10-CM | POA: Diagnosis present

## 2015-07-08 DIAGNOSIS — E1129 Type 2 diabetes mellitus with other diabetic kidney complication: Secondary | ICD-10-CM | POA: Diagnosis present

## 2015-07-08 DIAGNOSIS — Z7189 Other specified counseling: Secondary | ICD-10-CM | POA: Diagnosis present

## 2015-07-08 DIAGNOSIS — R7989 Other specified abnormal findings of blood chemistry: Secondary | ICD-10-CM | POA: Diagnosis present

## 2015-07-08 DIAGNOSIS — D649 Anemia, unspecified: Secondary | ICD-10-CM | POA: Diagnosis present

## 2015-07-08 DIAGNOSIS — J90 Pleural effusion, not elsewhere classified: Secondary | ICD-10-CM | POA: Diagnosis not present

## 2015-07-08 DIAGNOSIS — IMO0002 Reserved for concepts with insufficient information to code with codable children: Secondary | ICD-10-CM | POA: Insufficient documentation

## 2015-07-08 DIAGNOSIS — C799 Secondary malignant neoplasm of unspecified site: Secondary | ICD-10-CM

## 2015-07-08 LAB — I-STAT CHEM 8, ED
BUN: 56 mg/dL — ABNORMAL HIGH (ref 6–20)
Calcium, Ion: 1.02 mmol/L — ABNORMAL LOW (ref 1.13–1.30)
Chloride: 107 mmol/L (ref 101–111)
Creatinine, Ser: 2.6 mg/dL — ABNORMAL HIGH (ref 0.61–1.24)
GLUCOSE: 333 mg/dL — AB (ref 65–99)
HCT: 29 % — ABNORMAL LOW (ref 39.0–52.0)
HEMOGLOBIN: 9.9 g/dL — AB (ref 13.0–17.0)
Potassium: 5.4 mmol/L — ABNORMAL HIGH (ref 3.5–5.1)
Sodium: 137 mmol/L (ref 135–145)
TCO2: 22 mmol/L (ref 0–100)

## 2015-07-08 LAB — I-STAT ARTERIAL BLOOD GAS, ED
ACID-BASE DEFICIT: 6 mmol/L — AB (ref 0.0–2.0)
ACID-BASE DEFICIT: 6 mmol/L — AB (ref 0.0–2.0)
Acid-base deficit: 3 mmol/L — ABNORMAL HIGH (ref 0.0–2.0)
BICARBONATE: 21.6 meq/L (ref 20.0–24.0)
BICARBONATE: 19.9 meq/L — AB (ref 20.0–24.0)
Bicarbonate: 23.2 mEq/L (ref 20.0–24.0)
O2 SAT: 95 %
O2 SAT: 88 %
O2 SAT: 99 %
PCO2 ART: 39.8 mmHg (ref 35.0–45.0)
PCO2 ART: 47 mmHg — AB (ref 35.0–45.0)
PH ART: 7.23 — AB (ref 7.350–7.450)
PO2 ART: 92 mmHg (ref 80.0–100.0)
PO2 ART: 60 mmHg — AB (ref 80.0–100.0)
PO2 ART: 149 mmHg — AB (ref 80.0–100.0)
Patient temperature: 98.4
Patient temperature: 98.4
Patient temperature: 98.1
TCO2: 23 mmol/L (ref 0–100)
TCO2: 21 mmol/L (ref 0–100)
TCO2: 25 mmol/L (ref 0–100)
pCO2 arterial: 51.5 mmHg — ABNORMAL HIGH (ref 35.0–45.0)
pH, Arterial: 7.305 — ABNORMAL LOW (ref 7.350–7.450)
pH, Arterial: 7.3 — ABNORMAL LOW (ref 7.350–7.450)

## 2015-07-08 LAB — CBC
HEMATOCRIT: 27.4 % — AB (ref 39.0–52.0)
Hemoglobin: 8.8 g/dL — ABNORMAL LOW (ref 13.0–17.0)
MCH: 26.3 pg (ref 26.0–34.0)
MCHC: 32.1 g/dL (ref 30.0–36.0)
MCV: 81.8 fL (ref 78.0–100.0)
Platelets: 295 10*3/uL (ref 150–400)
RBC: 3.35 MIL/uL — ABNORMAL LOW (ref 4.22–5.81)
RDW: 20.3 % — AB (ref 11.5–15.5)
WBC: 6.2 10*3/uL (ref 4.0–10.5)

## 2015-07-08 LAB — INFLUENZA PANEL BY PCR (TYPE A & B)
H1N1 flu by pcr: NOT DETECTED
INFLAPCR: NEGATIVE
Influenza B By PCR: NEGATIVE

## 2015-07-08 LAB — BASIC METABOLIC PANEL
Anion gap: 15 (ref 5–15)
BUN: 39 mg/dL — AB (ref 6–20)
CO2: 19 mmol/L — ABNORMAL LOW (ref 22–32)
Calcium: 8.8 mg/dL — ABNORMAL LOW (ref 8.9–10.3)
Chloride: 104 mmol/L (ref 101–111)
Creatinine, Ser: 2.87 mg/dL — ABNORMAL HIGH (ref 0.61–1.24)
GFR calc Af Amer: 23 mL/min — ABNORMAL LOW (ref >60)
GFR, EST NON AFRICAN AMERICAN: 20 mL/min — AB (ref >60)
GLUCOSE: 331 mg/dL — AB (ref 65–99)
POTASSIUM: 4.3 mmol/L (ref 3.5–5.1)
Sodium: 138 mmol/L (ref 135–145)

## 2015-07-08 LAB — VITAMIN B12: VITAMIN B 12: 312 pg/mL (ref 180–914)

## 2015-07-08 LAB — I-STAT CG4 LACTIC ACID, ED
Lactic Acid, Venous: 3.73 mmol/L (ref 0.5–2.0)
Lactic Acid, Venous: 1.47 mmol/L (ref 0.5–2.0)

## 2015-07-08 LAB — CBG MONITORING, ED
Glucose-Capillary: 305 mg/dL — ABNORMAL HIGH (ref 65–99)
Glucose-Capillary: 114 mg/dL — ABNORMAL HIGH (ref 65–99)

## 2015-07-08 LAB — PROCALCITONIN: PROCALCITONIN: 0.25 ng/mL

## 2015-07-08 LAB — IRON AND TIBC
Iron: 22 ug/dL — ABNORMAL LOW (ref 45–182)
SATURATION RATIOS: 8 % — AB (ref 17.9–39.5)
TIBC: 277 ug/dL (ref 250–450)
UIBC: 255 ug/dL

## 2015-07-08 LAB — MRSA PCR SCREENING: MRSA BY PCR: POSITIVE — AB

## 2015-07-08 LAB — I-STAT TROPONIN, ED: Troponin i, poc: 0.02 ng/mL (ref 0.00–0.08)

## 2015-07-08 LAB — RETICULOCYTES
RBC.: 3.26 MIL/uL — AB (ref 4.22–5.81)
RETIC CT PCT: 2.7 % (ref 0.4–3.1)
Retic Count, Absolute: 88 10*3/uL (ref 19.0–186.0)

## 2015-07-08 LAB — GLUCOSE, CAPILLARY
Glucose-Capillary: 61 mg/dL — ABNORMAL LOW (ref 65–99)
Glucose-Capillary: 127 mg/dL — ABNORMAL HIGH (ref 65–99)

## 2015-07-08 LAB — FOLATE: Folate: 17.9 ng/mL (ref >5.9)

## 2015-07-08 LAB — BRAIN NATRIURETIC PEPTIDE: B Natriuretic Peptide: 312 pg/mL — ABNORMAL HIGH (ref 0.0–100.0)

## 2015-07-08 LAB — FERRITIN: FERRITIN: 47 ng/mL (ref 24–336)

## 2015-07-08 MED ORDER — SODIUM CHLORIDE 0.9% FLUSH
3.0000 mL | Freq: Two times a day (BID) | INTRAVENOUS | Status: DC
  Administered 2015-07-09: 10 mL via INTRAVENOUS
  Administered 2015-07-09 – 2015-07-17 (×13): 3 mL via INTRAVENOUS

## 2015-07-08 MED ORDER — MORPHINE SULFATE (PF) 2 MG/ML IV SOLN
1.0000 mg | INTRAVENOUS | Status: DC | PRN
  Administered 2015-07-09 – 2015-07-10 (×3): 1 mg via INTRAVENOUS
  Filled 2015-07-08 (×3): qty 1

## 2015-07-08 MED ORDER — SODIUM CHLORIDE 0.9 % IV BOLUS (SEPSIS)
1000.0000 mL | Freq: Once | INTRAVENOUS | Status: AC
  Administered 2015-07-08: 1000 mL via INTRAVENOUS

## 2015-07-08 MED ORDER — ACETAMINOPHEN 325 MG PO TABS: 650.0000 mg | ORAL_TABLET | Freq: Four times a day (QID) | ORAL | Status: DC | PRN

## 2015-07-08 MED ORDER — BRIMONIDINE TARTRATE 0.2 % OP SOLN
1.0000 [drp] | Freq: Two times a day (BID) | OPHTHALMIC | Status: DC
  Administered 2015-07-08 – 2015-07-16 (×17): 1 [drp] via OPHTHALMIC
  Filled 2015-07-08: qty 5

## 2015-07-08 MED ORDER — PIPERACILLIN-TAZOBACTAM 3.375 G IVPB 30 MIN
3.3750 g | Freq: Once | INTRAVENOUS | Status: AC
  Administered 2015-07-08: 3.375 g via INTRAVENOUS
  Filled 2015-07-08: qty 50

## 2015-07-08 MED ORDER — ALBUTEROL SULFATE (2.5 MG/3ML) 0.083% IN NEBU: 2.5000 mg | INHALATION_SOLUTION | RESPIRATORY_TRACT | Status: DC | PRN

## 2015-07-08 MED ORDER — VANCOMYCIN HCL IN DEXTROSE 1-5 GM/200ML-% IV SOLN
1000.0000 mg | Freq: Once | INTRAVENOUS | Status: AC
  Administered 2015-07-08: 1000 mg via INTRAVENOUS
  Filled 2015-07-08: qty 200

## 2015-07-08 MED ORDER — CHLORHEXIDINE GLUCONATE CLOTH 2 % EX PADS
6.0000 | MEDICATED_PAD | Freq: Every day | CUTANEOUS | Status: AC
  Administered 2015-07-09 – 2015-07-13 (×5): 6 via TOPICAL

## 2015-07-08 MED ORDER — PIPERACILLIN-TAZOBACTAM 3.375 G IVPB
3.3750 g | Freq: Three times a day (TID) | INTRAVENOUS | Status: DC
  Administered 2015-07-08 – 2015-07-14 (×16): 3.375 g via INTRAVENOUS
  Filled 2015-07-08 (×19): qty 50

## 2015-07-08 MED ORDER — DEXTROSE 50 % IV SOLN
INTRAVENOUS | Status: AC
  Administered 2015-07-08: 25 mL
  Filled 2015-07-08: qty 50

## 2015-07-08 MED ORDER — BRIMONIDINE TARTRATE-TIMOLOL 0.2-0.5 % OP SOLN: 1.0000 [drp] | Freq: Two times a day (BID) | OPHTHALMIC | Status: DC

## 2015-07-08 MED ORDER — VANCOMYCIN HCL IN DEXTROSE 1-5 GM/200ML-% IV SOLN
1000.0000 mg | INTRAVENOUS | Status: DC
  Administered 2015-07-08 – 2015-07-11 (×4): 1000 mg via INTRAVENOUS
  Filled 2015-07-08 (×6): qty 200

## 2015-07-08 MED ORDER — SODIUM CHLORIDE 0.9 % IV SOLN
INTRAVENOUS | Status: DC
  Administered 2015-07-08 – 2015-07-10 (×3): via INTRAVENOUS
  Administered 2015-07-12: 500 mL via INTRAVENOUS
  Administered 2015-07-12: 1000 mL via INTRAVENOUS
  Administered 2015-07-17: 14:00:00 via INTRAVENOUS

## 2015-07-08 MED ORDER — ACETAMINOPHEN 650 MG RE SUPP
650.0000 mg | Freq: Four times a day (QID) | RECTAL | Status: DC | PRN
Start: 2015-07-08 — End: 2015-07-17

## 2015-07-08 MED ORDER — ENOXAPARIN SODIUM 30 MG/0.3ML ~~LOC~~ SOLN
30.0000 mg | SUBCUTANEOUS | Status: DC
  Administered 2015-07-08 – 2015-07-12 (×5): 30 mg via SUBCUTANEOUS
  Filled 2015-07-08 (×5): qty 0.3

## 2015-07-08 MED ORDER — MUPIROCIN 2 % EX OINT
1.0000 "application " | TOPICAL_OINTMENT | Freq: Two times a day (BID) | CUTANEOUS | Status: AC
  Administered 2015-07-09 – 2015-07-13 (×10): 1 via NASAL
  Filled 2015-07-08: qty 22

## 2015-07-08 MED ORDER — TIMOLOL MALEATE 0.5 % OP SOLN
1.0000 [drp] | Freq: Two times a day (BID) | OPHTHALMIC | Status: DC
  Administered 2015-07-08 – 2015-07-16 (×17): 1 [drp] via OPHTHALMIC
  Filled 2015-07-08: qty 5

## 2015-07-08 MED ORDER — INSULIN ASPART 100 UNIT/ML ~~LOC~~ SOLN
0.0000 [IU] | SUBCUTANEOUS | Status: DC
  Administered 2015-07-08: 11 [IU] via SUBCUTANEOUS
  Administered 2015-07-09: 2 [IU] via SUBCUTANEOUS
  Administered 2015-07-09: 5 [IU] via SUBCUTANEOUS
  Administered 2015-07-09: 8 [IU] via SUBCUTANEOUS
  Administered 2015-07-10: 5 [IU] via SUBCUTANEOUS
  Administered 2015-07-10: 2 [IU] via SUBCUTANEOUS
  Administered 2015-07-10: 3 [IU] via SUBCUTANEOUS
  Administered 2015-07-10: 2 [IU] via SUBCUTANEOUS
  Filled 2015-07-08: qty 1

## 2015-07-08 NOTE — ED Notes (Signed)
Attempted report 

## 2015-07-08 NOTE — H&P (Signed)
[facility] History and Physical                                                                                    Hector Sloan, is a 75 y.o. male  MRN: [id number]   DOB - 08-05-1940  Admit Date - 06/30/2015  Outpatient Primary MD for the patient is [name], MD  Referring MD: Darl Householder / ER  Consulting M.D: Pulmonary Medicine  PMH: Past Medical History  Diagnosis Date  . Hypertension   . S/P BKA (below knee amputation) (HCC) IN 1998    LEFT/  HAS PROTHESIS  . History of kidney stones   . Peripheral vascular disease ([place]) S/P LEFT BKA 1998  . Glaucoma of both eyes   . Type 2 diabetes mellitus ([place])   . Diabetic retinopathy ([place])   . 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 CKD (chronic kidney disease), stage III     nephrologist--  dr Florene Glen  . At risk for sleep apnea     STOP-BANG= 4    SENT TO PCP 11-22-2013      PSH: Past Surgical History  Procedure Laterality Date  . Transurethral resection of bladder tumor  12-03-2010 ;   07-16-2010;,  03-12-2010    BLADDER CANCER (2011 W/ INSTILLATION CHEMO IN BLADDER)  . Multiple eye surg's (most of them of left)  LAST ONE 2009  (LEFT EYE)    INCLUDING VITRECTOMY'S / REPAIR'S  DETACHED RETINA/  GLAUCOMA SETON'S  . Below knee leg amputation Left 1998  . Cystoscopy with biopsy  06/03/2011    Procedure: CYSTOSCOPY WITH BIOPSY;  Surgeon: Franchot Gallo, MD;  Location: [facility];  Service: Urology;  Laterality: N/A;  . Cataract extraction w/ intraocular lens  implant, bilateral    . Cystoscopy w/ retrogrades  10/29/2011    Procedure: CYSTOSCOPY WITH RETROGRADE PYELOGRAM;  Surgeon: Franchot Gallo, MD;  Location: [facility];  Service: Urology;  Laterality: Bilateral;  30 MIN REQUESTED   . Transurethral resection of bladder tumor  10/29/2011    Procedure: TRANSURETHRAL RESECTION OF BLADDER TUMOR (TURBT);  Surgeon:  Franchot Gallo, MD;  Location: [facility];  Service: Urology;  Laterality: N/A;  . Transurethral resection of bladder tumor  04/04/2012    Procedure: TRANSURETHRAL RESECTION OF BLADDER TUMOR (TURBT);  Surgeon: Franchot Gallo, MD;  Location: [facility];  Service: Urology;  Laterality: N/A;  COLD CUP BIOPSY   . I  &  d right foot  04-22-2006    SECOND DEGREE BURN  . Cystoscopy w/ retrogrades Bilateral 03/02/2013    Procedure: CYSTOSCOPY WITH RETROGRADE PYELOGRAM AND BLADDER BIOPSY;  Surgeon: Franchot Gallo, MD;  Location: [facility];  Service: Urology;  Laterality: Bilateral;  . Cystoscopy w/ retrogrades Bilateral 11/27/2013    Procedure: CYSTOSCOPY WITH RETROGRADE PYELOGRAM;  Surgeon: Jorja Loa, MD;  Location: [facility];  Service: Urology;  Laterality: Bilateral;  . Cystoscopy with biopsy N/A 11/27/2013    Procedure: CYSTOSCOPY WITH BLADDER BIOPSIES, BLADDER AND RENAL WASHINGS;  Surgeon: Annie Main  Faythe Casa, MD;  Location: [facility];  Service: Urology;  Laterality: N/A;     CC:  Chief Complaint  Patient presents with  . Shortness of Breath     HPI: 75 year old male patient with a past medical history of diabetes on insulin, diabetic nephropathy with chronic kidney disease stage IV, normocytic anemia, grade 1 diastolic dysfunction with mild focal basal hypertrophy based on echocardiogram previous admission, preserved LV function, severely elevated pulmonary pressures with mild TR also noted on echo last admission. He was discharged from this facility on 05/14/15 after admission for H. He was sent to a rehabilitation facility and he was recently discharged this past Monday. Apparently throughout the duration of his time at the facility he was requiring nasal cannula oxygen but this was rapidly weaned and he was discharged home on room air. Patient reports that about 3 days prior to discharge from  rehabilitation he noticed having increased coughing and increasing shortness of breath and weakness but had not had any fevers. Symptoms persisted since discharge home. He presented to the ER because of the symptoms. He has not had any abdominal pain, nausea or vomiting. He is been working with therapy at home. He is not having chest pain or palpitations.  ER Evaluation and treatment: Temp 98.4-BP 147/70-pulse 118-respirations 24-room air saturations reported as 67%, up to 89% on 3 L at home percent on percent nonrebreather mask. EKG: Sinus tachycardia ventricular rate 118 bpm, QTC 440 ms, no ischemic changes and unchanged from previous EKG PCXR: Bilateral airspace opacities in partially loculated moderate bilateral effusions reported as no real change from previous chest x-ray although markedly different compared to January 2017 chest x-ray Lab data: Na 137, K 5.4, BUN 56, Cr 2.60, troponin 0.02, lactic acid 3.73, WBC 6200 and differential was obtained, hemoglobin 8.8, platelets 295,000, glucose 333 Normal saline bolus 1000 mL 1 Vancomycin 1 g IV 1 Zosyn 3.375 g IV 1  Review of Systems   In addition to the HPI above,  No Fever-chills, myalgias or other constitutional symptoms No Headache, changes with Vision or hearing, new weakness, tingling, numbness in any extremity, No problems swallowing food or Liquids, indigestion/reflux No Chest pain, palpitations, orthopnea  No Abdominal pain, N/V; no melena or hematochezia, no dark tarry stools, Bowel movements are regular, No dysuria, hematuria or flank pain No new skin rashes, lesions, masses or bruises, No new joints pains-aches No recent weight gain or loss No polyuria, polydypsia or polyphagia,  *A full 10 point Review of Systems was done, except as stated above, all other Review of Systems were negative.  Social History Social History  Substance Use Topics  . Smoking status: Former Smoker -- 1.00 packs/day for 5 years    Types:  Cigarettes    Quit date: 05/29/1990  . Smokeless tobacco: Never Used  . Alcohol Use: No    Resides at: Private residence  Lives with: Alone  Ambulatory status: Doubt assistive devices; has prosthesis to left lower extremity from prior BKA   Family History Family History  Problem Relation Age of Onset  . Diabetes Mellitus II Other      Prior to Admission medications   Medication Sig Start Date End Date Taking? Authorizing Provider  acetaminophen (TYLENOL) 325 MG tablet Take 2 tablets (650 mg total) by mouth every 6 (six) hours as needed for mild pain (or Fever >/= 101). 05/14/15  Yes Robbie Lis, MD  amLODipine (NORVASC) 10 MG tablet Take 1 tablet (10 mg total) by  mouth daily. 04/19/15  Yes Velvet Bathe, MD  brimonidine (ALPHAGAN) 0.2 % ophthalmic solution Place 1 drop into the left eye every 12 (twelve) hours.   Yes Historical Provider, MD  brimonidine-timolol (COMBIGAN) 0.2-0.5 % ophthalmic solution Place 1 drop into both eyes every 12 (twelve) hours.   Yes Historical Provider, MD  omeprazole (PRILOSEC) 20 MG capsule Take 20 mg by mouth daily. 03/26/15  Yes Historical Provider, MD  pioglitazone (ACTOS) 30 MG tablet Take 30 mg by mouth daily.   Yes Historical Provider, MD  Polyethyl Glycol-Propyl Glycol (SYSTANE OP) Apply 1 drop to eye 2 (two) times daily.   Yes Historical Provider, MD  Saxagliptin-Metformin (KOMBIGLYZE XR) 5-500 MG TB24 Take 1 tablet by mouth daily.   Yes Historical Provider, MD  timolol (BETIMOL) 0.5 % ophthalmic solution Place 1 drop into both eyes 2 (two) times daily.   Yes Historical Provider, MD  colchicine 0.6 MG tablet Take 1 tablet (0.6 mg total) by mouth daily. Patient not taking: Reported on 07/04/2015 04/19/15   Velvet Bathe, MD  levalbuterol Penne Lash) 0.63 MG/3ML nebulizer solution Take 3 mLs (0.63 mg total) by nebulization every 6 (six) hours as needed for wheezing or shortness of breath. Patient not taking: Reported on 07/06/2015 05/14/15   Robbie Lis,  MD  linagliptin (TRADJENTA) 5 MG TABS tablet Take 1 tablet (5 mg total) by mouth daily. Patient not taking: Reported on 07/24/2015 05/14/15   Robbie Lis, MD    No Known Allergies  Physical Exam  Vitals  Blood pressure 148/71, pulse 105, temperature 98.4 F (36.9 C), temperature source Oral, resp. rate 26, height 6\' 2"  (1.88 m), weight 205 lb (92.987 kg), SpO2 100 %.   General: Mild-to-moderate respiratory distress as evidenced by ongoing increased work of breathing  Psych:  Normal slightly anxious affect,  Awake Alert, Oriented X 3. Speech and thought patterns are clear and appropriate, no apparent short term memory deficits  Neuro:   No focal neurological deficits, CN II through XII intact, Strength 5/5 all 4 extremities, Sensation intact all 4 extremities.  ENT:  Ears and Eyes appear Normal, Conjunctivae clear, PER. Moist oral mucosa without erythema or exudates.  Neck:  Supple, No lymphadenopathy appreciated  Respiratory:  Symmetrical chest wall movement, diminished air movement bilaterally, expiratory crackles out wheezing and no rhonchi. 100% NRB mask  Cardiac:  RRR, No Murmurs, 1+ RLE edema noted, no JVD, No carotid bruits, peripheral pulses palpable at 2+  Abdomen:  Positive bowel sounds, Soft, Non tender, Non distended,  No masses appreciated, no obvious hepatosplenomegaly  Skin:  No Cyanosis, Normal Skin Turgor, No Skin Rash or Bruise.  Extremities: Symmetrical without obvious trauma or injury,  no effusions. Prosthesis in place over left BKA  Data Review  CBC  Recent Labs Lab 07/04/2015 1155 07/22/2015 1227  WBC 6.2  --   HGB 8.8* 9.9*  HCT 27.4* 29.0*  PLT 295  --   MCV 81.8  --   MCH 26.3  --   MCHC 32.1  --   RDW 20.3*  --     Chemistries   Recent Labs Lab 07/25/2015 1155 07/04/2015 1227  NA 138 137  K 4.3 5.4*  CL 104 107  CO2 19*  --   GLUCOSE 331* 333*  BUN 39* 56*  CREATININE 2.87* 2.60*  CALCIUM 8.8*  --     estimated creatinine  clearance is 28.1 mL/min (by C-G formula based on Cr of 2.6).  No results for input(s): TSH, T4TOTAL,  T3FREE, THYROIDAB in the last 72 hours.  Invalid input(s): FREET3  Coagulation profile No results for input(s): INR, PROTIME in the last 168 hours.  No results for input(s): DDIMER in the last 72 hours.  Cardiac Enzymes No results for input(s): CKMB, TROPONINI, MYOGLOBIN in the last 168 hours.  Invalid input(s): CK  Invalid input(s): POCBNP  Urinalysis    Component Value Date/Time   COLORURINE YELLOW 04/16/2015 1811   APPEARANCEUR CLOUDY* 04/16/2015 1811   LABSPEC 1.015 04/16/2015 1811   PHURINE 5.0 04/16/2015 1811   GLUCOSEU NEGATIVE 04/16/2015 1811   HGBUR NEGATIVE 04/16/2015 1811   BILIRUBINUR NEGATIVE 04/16/2015 1811   KETONESUR NEGATIVE 04/16/2015 1811   PROTEINUR 100* 04/16/2015 1811   NITRITE NEGATIVE 04/16/2015 1811   LEUKOCYTESUR NEGATIVE 04/16/2015 1811    Imaging results:   Dg Chest Port 1 View  07/25/2015  CLINICAL DATA:  Shortness of breath today. EXAM: PORTABLE CHEST 1 VIEW COMPARISON:  05/13/2015 FINDINGS: There are small to moderate bilateral pleural effusions and bilateral airspace opacities, similar prior study. Mild cardiomegaly. No acute bony abnormality. No change since prior study. IMPRESSION: Bilateral airspace opacities and partially loculated moderate bilateral effusions. No real change. Electronically Signed   By: Rolm Baptise M.D.   On: 07/14/2015 12:36     EKG: (Independently reviewed)  Sinus tachycardia ventricular rate 118 bpm, QTC 440 ms, no ischemic changes and unchanged from previous EKG   Assessment & Plan  Principal Problem:   Acute respiratory failure with hypoxiaAnd hypercarbia /HCAP -Patient with progressive shortness of breath and upper respiratory infection type symptoms over the past 6-7 days in setting of known recent treatment for HCAP and persistent x-ray changes concerning for possible parapneumonic effusion  -Admit to  stepdown/inpatient -Broad spectrum antibiotics continue Zosyn and vancomycin -Noted with increased work of breathing so check ABG and place on BiPAP;ABG reveals acute hypercarbic respiratory failure with PCO2 51.5 pH 7.23 noting ABG obtained on 100% oxygen PaO2 only 92 -Follow up on blood cultures, sputum culture, urinary legionella and strep -Influenza PCR ordered by ER physician -CT of chest to better clarify abnormal findings on chest x-ray i.e. possible parapneumonic effusion **CT scan has resulted with persistent extensive pleural/parenchymal lung disease with extensive pleural calcifications as well as complex fluid collections in the major fissure and bilateral pleural thickening; they're also nodular opacities at both lung bases. I discussed with E Link Dr. Oletta Darter and he will pass on need for pulmonary consultation which will either be done this evening by Dr. Wonda Amis or by one of the his partners in the morning -Patient has clarified he does not wish to be intubated  Active Problems:   Elevated lactic acid level -Suspect related to acute infectious process as well as following depletion from poorly controlled diabetes -Cycle and follow trend -Patient is afebrile and does not have leukocytosis but does have tachycardia and elevated respiratory rate secondary to acute hypoxemia, he is mentating appropriately and he seemed hemodynamically stable therefore at this juncture does not meet criteria for sepsis    Acute renal failure superimposed on stage 3 chronic kidney disease  -Baseline renal function BUN 35 and creatinine 2.0 none -Current BUN 56 and creatinine 2.6 -C1 liter fluids and ER will continue IV fluids for now to 100 mL per hour and monitor for volume overload    DM, type 2, uncontrolled, with renal complications -Currently uncontrolled with CBGs greater than 33 -On oral agents at home including metformin so have discontinued -Given stage IV chronic kidney  disease would not resume  metformin -For now check CBGs every 4 hours and provide SSI -Hemoglobin A1c    Leg edema, right -Trace edema and presents with hypoxemia so will check a venous duplex to rule out DVT     Normocytic anemia -Baseline hemoglobin between 7 and 8 -Suspect anemia related to patient's chronic kidney disease -Check anemia panel -Patient likely is an appropriate candidate for erythropoietin therapy's    Left ventricular diastolic dysfunction, NYHA class 1/Moderate to severe pulmonary hypertension  -No evidence of acute heart failure based on current symptomatology -It's presented with hypoxemia or completeness of evaluation will check BNP    DVT Prophylaxis: Lovenox dose adjusted for renal function  Family Communication:   No family at bedside  Code Status:  DO NOT INTUBATE  Condition:  Guarded  Discharge disposition: patient just completed rehabilitation stay and is now back in the hospital so once medically stable pending PT and OT evaluation patient Mable card additional stay in rehabilitation  Time spent in minutes : 60      Gwyndolyn Guilford L. ANP on 07/27/2015 at 1:59 PM  You may contact me by going to [email or web address] - password [email or web address]  I am available from 7a-7p but please confirm I am on the schedule by going to Amion as above.   After 7p please contact night coverage person covering me after hours  [facility]

## 2015-07-08 NOTE — ED Notes (Signed)
X-ray at bedside

## 2015-07-08 NOTE — ED Notes (Signed)
Phlebotomy notified for blood cultures, unable to obtain with IV start.  MD aware only 1 IV available.

## 2015-07-08 NOTE — Progress Notes (Signed)
MD ordered ABG on patient.  ABG was obtained immediately after patient was placed on bipap.  Will continue to monitor.     Ref. Range 07/22/2015 14:08  Sample type Unknown ARTERIAL  pH, Arterial Latest Ref Range: 7.350-7.450  7.230 (L)  pCO2 arterial Latest Ref Range: 35.0-45.0 mmHg 51.5 (H)  pO2, Arterial Latest Ref Range: 80.0-100.0 mmHg 92.0  Bicarbonate Latest Ref Range: 20.0-24.0 mEq/L 21.6  TCO2 Latest Ref Range: 0-100 mmol/L 23  Acid-base deficit Latest Ref Range: 0.0-2.0 mmol/L 6.0 (H)  O2 Saturation Latest Units: % 95.0  Patient temperature Unknown 98.4 F  Collection site Unknown RADIAL, ALLEN'S T.Marland KitchenMarland Kitchen

## 2015-07-08 NOTE — ED Notes (Signed)
Phlebotomy notified to attempt blood draw.

## 2015-07-08 NOTE — ED Notes (Signed)
RT at bedside placing pt on Bipap.

## 2015-07-08 NOTE — ED Notes (Signed)
Per pt, having sob. Recent pneumonia. Denies fever, productive cough or swelling. spo2 67% on room air at triage. ekg done.

## 2015-07-08 NOTE — ED Provider Notes (Signed)
CSN: [id number]     Arrival date & time 07/26/2015  1139 History   First MD Initiated Contact with Patient 07/09/2015 1158     Chief Complaint  Patient presents with  . Shortness of Breath     (Consider location/radiation/quality/duration/timing/severity/associated sxs/prior Treatment) The history is provided by the patient. The history is limited by the condition of the patient.  Hector Sloan is a 75 y.o. male hx of HTN, DM, CKD, here with shortness of breath. Recently admitted for pneumonia and was in rehab until last Wednesday. He has been coughing for months and had worsening shortness of breath for the last 10 days. He was on oxygen at the rehab center but was taken off and not discharged with oxygen. Progressively worse shortness of breath since being home. Subjective fevers as well. Has noticed some R leg swelling as well. Was noted to be hypoxic 67% on RA in triage.   Level  V caveat- condition of patient   Past Medical History  Diagnosis Date  . Hypertension   . S/P BKA (below knee amputation) (HCC) IN 1998    LEFT/  HAS PROTHESIS  . History of kidney stones   . Peripheral vascular disease ([place]) S/P LEFT BKA 1998  . Glaucoma of both eyes   . Type 2 diabetes mellitus ([place])   . Diabetic retinopathy ([place])   . 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 CKD (chronic kidney disease), stage III     nephrologist--  dr Florene Glen  . At risk for sleep apnea     STOP-BANG= 4    SENT TO PCP 11-22-2013   Past Surgical History  Procedure Laterality Date  . Transurethral resection of bladder tumor  12-03-2010 ;   07-16-2010;,  03-12-2010    BLADDER CANCER (2011 W/ INSTILLATION CHEMO IN BLADDER)  . Multiple eye surg's (most of them of left)  LAST ONE 2009  (LEFT EYE)    INCLUDING VITRECTOMY'S / REPAIR'S  DETACHED RETINA/  GLAUCOMA SETON'S  . Below knee leg amputation Left 1998  . Cystoscopy with biopsy  06/03/2011   Procedure: CYSTOSCOPY WITH BIOPSY;  Surgeon: Franchot Gallo, MD;  Location: [facility];  Service: Urology;  Laterality: N/A;  . Cataract extraction w/ intraocular lens  implant, bilateral    . Cystoscopy w/ retrogrades  10/29/2011    Procedure: CYSTOSCOPY WITH RETROGRADE PYELOGRAM;  Surgeon: Franchot Gallo, MD;  Location: [facility];  Service: Urology;  Laterality: Bilateral;  30 MIN REQUESTED   . Transurethral resection of bladder tumor  10/29/2011    Procedure: TRANSURETHRAL RESECTION OF BLADDER TUMOR (TURBT);  Surgeon: Franchot Gallo, MD;  Location[facility];  Service: Urology;  Laterality: N/A;  . Transurethral resection of bladder tumor  04/04/2012    Procedure: TRANSURETHRAL RESECTION OF BLADDER TUMOR (TURBT);  Surgeon: Franchot Gallo, MD;  Location: [facility];  Service: Urology;  Laterality: N/A;  COLD CUP BIOPSY   . I  &  d right foot  04-22-2006    SECOND DEGREE BURN  . Cystoscopy w/ retrogrades Bilateral 03/02/2013    Procedure: CYSTOSCOPY WITH RETROGRADE PYELOGRAM AND BLADDER BIOPSY;  Surgeon: Franchot Gallo, MD;  [facility];  Service: Urology;  Laterality: Bilateral;  . Cystoscopy w/ retrogrades Bilateral 11/27/2013    Procedure: CYSTOSCOPY WITH RETROGRADE PYELOGRAM;  Surgeon: Jorja Loa, MD;  Location: [facility];  Service: Urology;  Laterality: Bilateral;  . Cystoscopy with biopsy N/A 11/27/2013    Procedure: CYSTOSCOPY WITH BLADDER BIOPSIES, BLADDER AND RENAL WASHINGS;  Surgeon: Jorja Loa, MD;  Location: [facility];  Service: Urology;  Laterality: N/A;   Family History  Problem Relation Age of Onset  . Diabetes Mellitus II Other    Social History  Substance Use Topics  . Smoking status: Former Smoker -- 1.00 packs/day for 5 years    Types: Cigarettes    Quit date: 05/29/1990  . Smokeless tobacco: Never Used  . Alcohol Use: No     Review of Systems  Respiratory: Positive for shortness of breath.   All other systems reviewed and are negative.     Allergies  Review of patient's allergies indicates no known allergies.  Home Medications   Prior to Admission medications   Medication Sig Start Date End Date Taking? Authorizing Provider  acetaminophen (TYLENOL) 325 MG tablet Take 2 tablets (650 mg total) by mouth every 6 (six) hours as needed for mild pain (or Fever >/= 101). 05/14/15  Yes Robbie Lis, MD  amLODipine (NORVASC) 10 MG tablet Take 1 tablet (10 mg total) by mouth daily. 04/19/15  Yes Velvet Bathe, MD  brimonidine (ALPHAGAN) 0.2 % ophthalmic solution Place 1 drop into the left eye every 12 (twelve) hours.   Yes Historical Provider, MD  brimonidine-timolol (COMBIGAN) 0.2-0.5 % ophthalmic solution Place 1 drop into both eyes every 12 (twelve) hours.   Yes Historical Provider, MD  omeprazole (PRILOSEC) 20 MG capsule Take 20 mg by mouth daily. 03/26/15  Yes Historical Provider, MD  pioglitazone (ACTOS) 30 MG tablet Take 30 mg by mouth daily.   Yes Historical Provider, MD  Polyethyl Glycol-Propyl Glycol (SYSTANE OP) Apply 1 drop to eye 2 (two) times daily.   Yes Historical Provider, MD  Saxagliptin-Metformin (KOMBIGLYZE XR) 5-500 MG TB24 Take 1 tablet by mouth daily.   Yes Historical Provider, MD  timolol (BETIMOL) 0.5 % ophthalmic solution Place 1 drop into both eyes 2 (two) times daily.   Yes Historical Provider, MD  colchicine 0.6 MG tablet Take 1 tablet (0.6 mg total) by mouth daily. Patient not taking: Reported on 07/27/2015 04/19/15   Velvet Bathe, MD  levalbuterol Penne Lash) 0.63 MG/3ML nebulizer solution Take 3 mLs (0.63 mg total) by nebulization every 6 (six) hours as needed for wheezing or shortness of breath. Patient not taking: Reported on 07/22/2015 05/14/15   Robbie Lis, MD  linagliptin (TRADJENTA) 5 MG TABS tablet Take 1 tablet (5 mg total) by mouth daily. Patient not taking: Reported on  07/23/2015 05/14/15   Robbie Lis, MD   BP 138/74 mmHg  Pulse 106  Temp(Src) 98.4 F (36.9 C) (Oral)  Resp 28  Ht 6\' 2"  (1.88 m)  Wt 205 lb (92.987 kg)  BMI 26.31 kg/m2  SpO2 100% Physical Exam  Constitutional: He is oriented to person, place, and time.  Ill appearing   HENT:  Head: Normocephalic.  MM dry   Eyes: Conjunctivae are normal. Pupils are equal, round, and reactive to light.  Neck: Normal range of motion. Neck supple.  Cardiovascular: Regular rhythm and normal heart sounds.   Tachycardic   Pulmonary/Chest:  Crackles R base   Abdominal: Soft. Bowel sounds are normal. He exhibits no distension. There is no tenderness. There is no rebound and no guarding.  Musculoskeletal: Normal range of motion.  L AKA. R pedal edema.   Neurological: He is alert and  oriented to person, place, and time.  Skin: Skin is warm and dry.  Psychiatric: He has a normal mood and affect. His behavior is normal. Judgment and thought content normal.  Nursing note and vitals reviewed.   ED Course  Procedures (including critical care time)  CRITICAL CARE Performed by: Darl Householder, DAVID   Total critical care time:30  minutes  Critical care time was exclusive of separately billable procedures and treating other patients.  Critical care was necessary to treat or prevent imminent or life-threatening deterioration.  Critical care was time spent personally by me on the following activities: development of treatment plan with patient and/or surrogate as well as nursing, discussions with consultants, evaluation of patient's response to treatment, examination of patient, obtaining history from patient or surrogate, ordering and performing treatments and interventions, ordering and review of laboratory studies, ordering and review of radiographic studies, pulse oximetry and re-evaluation of patient's condition.   Labs Review Labs Reviewed  BASIC METABOLIC PANEL - Abnormal; Notable for the following:    CO2  19 (*)    Glucose, Bld 331 (*)    BUN 39 (*)    Creatinine, Ser 2.87 (*)    Calcium 8.8 (*)    GFR calc non Af Amer 20 (*)    GFR calc Af Amer 23 (*)    All other components within normal limits  CBC - Abnormal; Notable for the following:    RBC 3.35 (*)    Hemoglobin 8.8 (*)    HCT 27.4 (*)    RDW 20.3 (*)    All other components within normal limits  I-STAT CG4 LACTIC ACID, ED - Abnormal; Notable for the following:    Lactic Acid, Venous 3.73 (*)    All other components within normal limits  I-STAT CHEM 8, ED - Abnormal; Notable for the following:    Potassium 5.4 (*)    BUN 56 (*)    Creatinine, Ser 2.60 (*)    Glucose, Bld 333 (*)    Calcium, Ion 1.02 (*)    Hemoglobin 9.9 (*)    HCT 29.0 (*)    All other components within normal limits  CULTURE, BLOOD (ROUTINE X 2)  CULTURE, BLOOD (ROUTINE X 2)  INFLUENZA PANEL BY PCR (TYPE A & B, H1N1)  I-STAT TROPOININ, ED    Imaging Review Dg Chest Port 1 View  07/15/2015  CLINICAL DATA:  Shortness of breath today. EXAM: PORTABLE CHEST 1 VIEW COMPARISON:  05/13/2015 FINDINGS: There are small to moderate bilateral pleural effusions and bilateral airspace opacities, similar prior study. Mild cardiomegaly. No acute bony abnormality. No change since prior study. IMPRESSION: Bilateral airspace opacities and partially loculated moderate bilateral effusions. No real change. Electronically Signed   By: Rolm Baptise M.D.   On: 07/27/2015 12:36   I have personally reviewed and evaluated these images and lab results as part of my medical decision-making.   EKG Interpretation   Date/Time:  Monday July 08 2015 11:42:48 EDT Ventricular Rate:  118 PR Interval:  164 QRS Duration: 72 QT Interval:  314 QTC Calculation: 440 R Axis:   36 Text Interpretation:  Sinus tachycardia Otherwise normal ECG No  significant change since last tracing Confirmed by YAO  MD, DAVID ([id number])  on 07/26/2015 11:58:26 AM Also confirmed by Darl Householder  MD, DAVID ([id number]),  editor  [place]  CCT, BEVERLY ([id number])  on 07/19/2015 12:58:07 PM      MDM   Final diagnoses:  None    Hector Sloan is a  75 y.o. male here with cough, shortness of breath, hypoxia. Concerned for possible pneumonia vs flu vs PE. Patient has baseline Cr 2 so will not be able to get CT angio. Will call code sepsis and give IVF and do sepsis workup.   1:17 PM Lactate 3.7. Xray showed bilateral airspace opacities with loculated effusions likely explaining hypoxia. Given vanc/zosyn for HCAP. Flu sent. Also has acute renal failure. O2 100% on nonrebreather. Doesn't need intubation currently. Still tachypneic. Will admit to stepdown for severe sepsis.    Wandra Arthurs, MD 07/16/2015 1323

## 2015-07-08 NOTE — ED Notes (Signed)
Hector [id number]  [place] [facility]: [phone]  [phone] cell, [phone] home  Please contact if needed.

## 2015-07-08 NOTE — ED Notes (Signed)
Admitting at bedside 

## 2015-07-08 NOTE — Progress Notes (Signed)
This pt is active with [facility] since d/c from [place] care  ED Cm consulted by Abby of [facility]

## 2015-07-08 NOTE — Progress Notes (Signed)
Patient transferred from ED to [facility] without any complications.

## 2015-07-08 NOTE — Progress Notes (Addendum)
Follow-up ABG on 40% FIO2 shows some improvement in pH now up to 7.305 with normalization of PCO2; PO2 has decreased to 60, bicarbonate has decreased to 19.9 and acid base balance is stable at 6.0. I have asked respiratory to increase FiO2 to 60% on BiPAP will get another ABG in 4 hours   Repeat lactic acid down to 1.47  Influenza PCR negative so I have dc'd droplet precautions    Erin Hearing, ANP

## 2015-07-08 NOTE — ED Notes (Signed)
EMT attempt blood draw x 1-unsuccessful.

## 2015-07-08 NOTE — Progress Notes (Signed)
RT went to patient room due to MD ordering bipap for patient.  Patient was placed on bipap and is currently tolerating well.  Will continue to monitor.

## 2015-07-08 NOTE — Progress Notes (Signed)
Pharmacy Antibiotic Note  Hector Sloan is a 75 y.o. male admitted on 07/20/2015 with sepsis.  Presents with SOB needing BiPAP and abnormal chest CT.  Recently admitted for HCAP, previously admitted for gout flare.  Pharmacy has been consulted for Vancomycin and Zosyn dosing.  Patient received first doses earlier today.  On Admit: Afebrile, HR 102, RR 22, WBC 6.2 SpO2 89 (100 with Bi-PAP), LA 1.47, PCT 0.25, SCr 2.6 (CrCl 28.1)  Plan: --Vancomycin 1000 mg IV q24h - starting now --Zosyn 3.375g IV q8h (extended infusion) --Obtain Vanc Trough at steady state (goal 15-20) --Follow renal function, clinical course, and culture   Height: 6\' 2"  (188 cm) Weight: 205 lb (92.987 kg) IBW/kg (Calculated) : 82.2  Temp (24hrs), Avg:98.4 F (36.9 C), Min:98.4 F (36.9 C), Max:98.4 F (36.9 C)   Recent Labs Lab 07/12/2015 1155 07/20/2015 1224 06/30/2015 1227 07/24/2015 1725  WBC 6.2  --   --   --   CREATININE 2.87*  --  2.60*  --   LATICACIDVEN  --  3.73*  --  1.47    Estimated Creatinine Clearance: 28.1 mL/min (by C-G formula based on Cr of 2.6).    No Known Allergies  Antimicrobials this admission: 4/10 Zosyn >>  4/10 Vanc >>   Dose adjustments this admission:   Microbiology results: 4/10 BCx:  4/10 Sputum:     Thank you for allowing pharmacy to be a part of this patient's care.  Viann Fish 07/10/2015 8:03 PM

## 2015-07-08 NOTE — Consult Note (Signed)
Name: Hector Sloan MRN: [id number] DOB: 1940-05-09    ADMISSION DATE:  07/11/2015 CONSULTATION DATE:  4/10  REFERRING MD :  TRH  CHIEF COMPLAINT:  SOB  BRIEF PATIENT DESCRIPTION: 75 year old male with recent admission for PNA now presenting with SOB requiring BiPAP and abnormal chest CT  SIGNIFICANT EVENTS  4/10 admit, BiPAP  STUDIES:  4/10 CT chest > Persistent extensive pleural and parenchymal lung disease. Could not exclude pleural tumor (mesothelioma). Extensive pleural calcifications could be related to previous trauma, hemorrhage, infection, pleurodesis or asbestos related pleural disease. Complex fluid collections in the major fissure and bilateral pleural thickening. Nodular opacities at both lung bases could reflect rounded atelectasis or tumor.Extensive lung disease.   HISTORY OF PRESENT ILLNESS:  75 year old male with PMH as below, which includes diabetes mellitus, hypertension, left BKA, bladder cancer (s/p transuretheral resection), chronic kidney disease was recently admitted to [facility] for HCAP (was also recently admitted for gout flare). He was treated with broad spectrum antibiotics, supplemental O2, and was eventually transitioned to oral levaquin and discharged to SNF. He was weaned off of O2 quickly in the facility.  Initially he was doing okay, and about 3 days prior to discharge he developed worsening cough and intermittent SOB. He was discharged 4/5 from SNF. Since arriving home SOB has continued to worsen and he presented to [facility] ED 4/10 with this complaint. He endorsed cough, but denied fever/chills/sputum production. In the ED he was noted to have lactic acid elevation and was hypoxemic. He was started on BiPAP. CT of the chest demonstrated bilateral airspace disease and loculated effusion. PCCM asked to see for further evaluation.   PAST MEDICAL HISTORY :   has a past medical history of Hypertension; S/P BKA (below knee amputation) ([place]) (IN 1998); History of  kidney stones; Peripheral vascular disease ([place]) (S/P LEFT BKA 1998); Glaucoma of both eyes; Type 2 diabetes mellitus ([place]); Diabetic retinopathy ([place]); Bladder cancer (HCC) (RECURRENT UROTHELIAL CARCINOMA); CKD (chronic kidney disease), stage III; and At risk for sleep apnea.  has past surgical history that includes Transurethral resection of bladder tumor (12-03-2010 ;   07-16-2010;,  03-12-2010); MULTIPLE EYE SURG'S (MOST OF THEM OF LEFT) (LAST ONE 2009  (LEFT EYE)); Below knee leg amputation (Left, 1998); Cystoscopy with biopsy (06/03/2011); Cataract extraction w/ intraocular lens  implant, bilateral; Cystoscopy w/ retrogrades (10/29/2011); Transurethral resection of bladder tumor (10/29/2011); Transurethral resection of bladder tumor (04/04/2012); I  &  D RIGHT FOOT (04-22-2006); Cystoscopy w/ retrogrades (Bilateral, 03/02/2013); Cystoscopy w/ retrogrades (Bilateral, 11/27/2013); and Cystoscopy with biopsy (N/A, 11/27/2013). Prior to Admission medications   Medication Sig Start Date End Date Taking? Authorizing Provider  acetaminophen (TYLENOL) 325 MG tablet Take 2 tablets (650 mg total) by mouth every 6 (six) hours as needed for mild pain (or Fever >/= 101). 05/14/15  Yes Robbie Lis, MD  amLODipine (NORVASC) 10 MG tablet Take 1 tablet (10 mg total) by mouth daily. 04/19/15  Yes Velvet Bathe, MD  brimonidine (ALPHAGAN) 0.2 % ophthalmic solution Place 1 drop into the left eye every 12 (twelve) hours.   Yes Historical Provider, MD  brimonidine-timolol (COMBIGAN) 0.2-0.5 % ophthalmic solution Place 1 drop into both eyes every 12 (twelve) hours.   Yes Historical Provider, MD  omeprazole (PRILOSEC) 20 MG capsule Take 20 mg by mouth daily. 03/26/15  Yes Historical Provider, MD  pioglitazone (ACTOS) 30 MG tablet Take 30 mg by mouth daily.   Yes Historical Provider, MD  Polyethyl Glycol-Propyl Glycol (SYSTANE  OP) Apply 1 drop to eye 2 (two) times daily.   Yes Historical Provider, MD  Saxagliptin-Metformin (KOMBIGLYZE  XR) 5-500 MG TB24 Take 1 tablet by mouth daily.   Yes Historical Provider, MD  timolol (BETIMOL) 0.5 % ophthalmic solution Place 1 drop into both eyes 2 (two) times daily.   Yes Historical Provider, MD  colchicine 0.6 MG tablet Take 1 tablet (0.6 mg total) by mouth daily. Patient not taking: Reported on 06/29/2015 04/19/15   Velvet Bathe, MD  levalbuterol Penne Lash) 0.63 MG/3ML nebulizer solution Take 3 mLs (0.63 mg total) by nebulization every 6 (six) hours as needed for wheezing or shortness of breath. Patient not taking: Reported on 07/06/2015 05/14/15   Robbie Lis, MD  linagliptin (TRADJENTA) 5 MG TABS tablet Take 1 tablet (5 mg total) by mouth daily. Patient not taking: Reported on 07/26/2015 05/14/15   Robbie Lis, MD   No Known Allergies  FAMILY HISTORY:  family history includes Diabetes Mellitus II in his other. SOCIAL HISTORY:  reports that he quit smoking about 25 years ago. His smoking use included Cigarettes. He has a 5 pack-year smoking history. He has never used smokeless tobacco. He reports that he does not drink alcohol or use illicit drugs.  REVIEW OF SYSTEMS:   Bolds are positive  Constitutional: weight loss, gain, night sweats, Fevers, chills, fatigue .  HEENT: headaches, Sore throat, sneezing, nasal congestion, post nasal drip, Difficulty swallowing, Tooth/dental problems, visual complaints visual changes, ear ache CV:  chest pain, radiates: ,Orthopnea, PND, swelling in lower extremities, dizziness, palpitations, syncope.  GI  heartburn, indigestion, abdominal pain, nausea, vomiting, diarrhea, change in bowel habits, loss of appetite, bloody stools.  Resp: cough, productive: , hemoptysis, dyspnea, chest pain, pleuritic.  Skin: rash or itching or icterus GU: dysuria, change in color of urine, urgency or frequency. flank pain, hematuria  MS: joint pain or swelling. decreased range of motion  Psych: change in mood or affect. depression or anxiety.  Neuro: difficulty with  speech, weakness, numbness, ataxia   SUBJECTIVE:   VITAL SIGNS: Temp:  [98.4 F (36.9 C)] 98.4 F (36.9 C) (04/10 1147) Pulse Rate:  [57-119] 108 (04/10 1545) Resp:  [19-31] 24 (04/10 1545) BP: (105-167)/(70-94) 150/76 mmHg (04/10 1545) SpO2:  [68 %-100 %] 97 % (04/10 1545) FiO2 (%):  [40 %] 40 % (04/10 1411) Weight:  [92.987 kg (205 lb)] 92.987 kg (205 lb) (04/10 1222)  PHYSICAL EXAMINATION: General:  Elderly male in NAD on BiPAP Neuro:  Alert, oriented, non-focal HEENT:  [place]/AT, PERRL, no appreciable JVD Cardiovascular:  RRR, no MRG Lungs:  Bilateral rhonchi  Abdomen:  Obese, soft, non-tender, normoactive BS Musculoskeletal:  L BKA with prothesis in place Skin:  Grossly intact   Recent Labs Lab 07/03/2015 1155 07/18/2015 1227  NA 138 137  K 4.3 5.4*  CL 104 107  CO2 19*  --   BUN 39* 56*  CREATININE 2.87* 2.60*  GLUCOSE 331* 333*    Recent Labs Lab 07/16/2015 1155 07/26/2015 1227  HGB 8.8* 9.9*  HCT 27.4* 29.0*  WBC 6.2  --   PLT 295  --    Ct Chest Wo Contrast  06/30/2015  CLINICAL DATA:  Four-day history of shortness of breath. History of bladder cancer. EXAM: CT CHEST WITHOUT CONTRAST TECHNIQUE: Multidetector CT imaging of the chest was performed following the standard protocol without IV contrast. COMPARISON:  Chest CT 05/10/2015 FINDINGS: Mediastinum/Nodes: No chest wall mass, supraclavicular or axillary lymphadenopathy. Small scattered lymph nodes are noted.  The thyroid gland is grossly normal. The heart is mildly enlarged but stable. Stable three-vessel coronary artery calcifications. Extensive aortic calcifications without focal aneurysm. Stable borderline enlarged mediastinal lymph nodes. 11 mm nodule on image 22 is unchanged. 15 mm nodule on image number 23 previously measured 13.5 mm. No new nodules. Lungs/Pleura: Extensive persistent pleural and parenchymal lung disease. There is a persistent rim calcified loculated pleural fluid collection, likely related to  previous infection or hemorrhage. There are dense pleural calcifications on the right also. This could reflect asbestos related pleural disease. Persistent complex fluid collections in right major fissure. There is also rounded densities at both lung bases which could be round atelectasis. Tumor could not be excluded. Stable oblong density in the lingular region. Significant chronic lung disease with peribronchial thickening and areas of emphysematous change. Upper abdomen: No significant upper abdominal findings. Advanced aortic calcifications. Musculoskeletal: No significant bony findings. IMPRESSION: 1. Persistent extensive pleural and parenchymal lung disease. Could not exclude pleural tumor (mesothelioma). Extensive pleural calcifications could be related to previous trauma, hemorrhage, infection, pleurodesis or asbestos related pleural disease. 2. Complex fluid collections in the major fissure and bilateral pleural thickening. 3. Nodular opacities at both lung bases could reflect rounded atelectasis or tumor. 4. Extensive lung disease. 5. Stable borderline enlarged mediastinal lymph nodes. Electronically Signed   By: Marijo Sanes M.D.   On: 07/04/2015 14:05   Dg Chest Port 1 View  07/16/2015  CLINICAL DATA:  Shortness of breath today. EXAM: PORTABLE CHEST 1 VIEW COMPARISON:  05/13/2015 FINDINGS: There are small to moderate bilateral pleural effusions and bilateral airspace opacities, similar prior study. Mild cardiomegaly. No acute bony abnormality. No change since prior study. IMPRESSION: Bilateral airspace opacities and partially loculated moderate bilateral effusions. No real change. Electronically Signed   By: Rolm Baptise M.D.   On: 07/15/2015 12:36    ASSESSMENT / PLAN:  Acute hypoxemic respiratory failure in the setting of recurrent HCAP and bilateral loculated effusions.  - Agree broad spectrum antibiotics - Trend WBC, fevers, PCT - Continue BiPAP PRN with goal for manageable work of  breathing and SpO2 > 90% - At risk for ICU transfer and intubation - PRN bronchodilators - Assessed pleura with ultrasound in ED, no areas amenable to thoracentesis at this time. Ideally could benefit from pleural fluid sampling and culture.  - Low threshold for CVTS involvement if no signigicant improvement  Georgann Housekeeper, AGACNP-BC [facility] Pulmonology/Critical Care Pager [phone] or [phone]  07/01/2015 4:29 PM

## 2015-07-08 NOTE — ED Notes (Signed)
1st set of blood cultures obtained prior to starting abx, phlebtomy at bedside attempting to obtain 2nd set.

## 2015-07-09 ENCOUNTER — Inpatient Hospital Stay (HOSPITAL_COMMUNITY): Payer: Medicare Other

## 2015-07-09 DIAGNOSIS — I272 Other secondary pulmonary hypertension: Secondary | ICD-10-CM

## 2015-07-09 DIAGNOSIS — J929 Pleural plaque without asbestos: Secondary | ICD-10-CM

## 2015-07-09 DIAGNOSIS — J90 Pleural effusion, not elsewhere classified: Secondary | ICD-10-CM

## 2015-07-09 DIAGNOSIS — IMO0002 Reserved for concepts with insufficient information to code with codable children: Secondary | ICD-10-CM | POA: Diagnosis present

## 2015-07-09 DIAGNOSIS — E1165 Type 2 diabetes mellitus with hyperglycemia: Secondary | ICD-10-CM

## 2015-07-09 DIAGNOSIS — E1121 Type 2 diabetes mellitus with diabetic nephropathy: Secondary | ICD-10-CM

## 2015-07-09 DIAGNOSIS — J9601 Acute respiratory failure with hypoxia: Secondary | ICD-10-CM

## 2015-07-09 DIAGNOSIS — R600 Localized edema: Secondary | ICD-10-CM

## 2015-07-09 DIAGNOSIS — I519 Heart disease, unspecified: Secondary | ICD-10-CM

## 2015-07-09 LAB — COMPREHENSIVE METABOLIC PANEL
ALBUMIN: 2.1 g/dL — AB (ref 3.5–5.0)
ALK PHOS: 56 U/L (ref 38–126)
ALT: 11 U/L — AB (ref 17–63)
ANION GAP: 9 (ref 5–15)
AST: 13 U/L — ABNORMAL LOW (ref 15–41)
BILIRUBIN TOTAL: 0.7 mg/dL (ref 0.3–1.2)
BUN: 34 mg/dL — ABNORMAL HIGH (ref 6–20)
CALCIUM: 8.4 mg/dL — AB (ref 8.9–10.3)
CO2: 22 mmol/L (ref 22–32)
CREATININE: 2.53 mg/dL — AB (ref 0.61–1.24)
Chloride: 111 mmol/L (ref 101–111)
GFR calc Af Amer: 27 mL/min — ABNORMAL LOW (ref >60)
GFR calc non Af Amer: 23 mL/min — ABNORMAL LOW (ref >60)
GLUCOSE: 105 mg/dL — AB (ref 65–99)
Potassium: 4.5 mmol/L (ref 3.5–5.1)
SODIUM: 142 mmol/L (ref 135–145)
TOTAL PROTEIN: 6.7 g/dL (ref 6.5–8.1)

## 2015-07-09 LAB — HEMOGLOBIN A1C
Hgb A1c MFr Bld: 7.4 % — ABNORMAL HIGH (ref 4.8–5.6)
Mean Plasma Glucose: 166 mg/dL

## 2015-07-09 LAB — ABO/RH: ABO/RH(D): B POS

## 2015-07-09 LAB — CBC
HCT: 22.1 % — ABNORMAL LOW (ref 39.0–52.0)
HEMOGLOBIN: 7.1 g/dL — AB (ref 13.0–17.0)
MCH: 26.8 pg (ref 26.0–34.0)
MCHC: 33 g/dL (ref 30.0–36.0)
MCV: 81.3 fL (ref 78.0–100.0)
PLATELETS: 215 10*3/uL (ref 150–400)
RBC: 2.72 MIL/uL — ABNORMAL LOW (ref 4.22–5.81)
RDW: 20.3 % — AB (ref 11.5–15.5)
WBC: 5.2 10*3/uL (ref 4.0–10.5)

## 2015-07-09 LAB — HIV ANTIBODY (ROUTINE TESTING W REFLEX): HIV Screen 4th Generation wRfx: NONREACTIVE

## 2015-07-09 LAB — GLUCOSE, CAPILLARY
GLUCOSE-CAPILLARY: 96 mg/dL (ref 65–99)
GLUCOSE-CAPILLARY: 102 mg/dL — AB (ref 65–99)
GLUCOSE-CAPILLARY: 278 mg/dL — AB (ref 65–99)
Glucose-Capillary: 107 mg/dL — ABNORMAL HIGH (ref 65–99)
Glucose-Capillary: 121 mg/dL — ABNORMAL HIGH (ref 65–99)
Glucose-Capillary: 209 mg/dL — ABNORMAL HIGH (ref 65–99)

## 2015-07-09 LAB — PREPARE RBC (CROSSMATCH)

## 2015-07-09 LAB — STREP PNEUMONIAE URINARY ANTIGEN: Strep Pneumo Urinary Antigen: NEGATIVE

## 2015-07-09 MED ORDER — SODIUM CHLORIDE 0.9 % IV SOLN
Freq: Once | INTRAVENOUS | Status: AC
  Administered 2015-07-09: 12:00:00 via INTRAVENOUS

## 2015-07-09 MED ORDER — IPRATROPIUM-ALBUTEROL 0.5-2.5 (3) MG/3ML IN SOLN: 3.0000 mL | Freq: Four times a day (QID) | RESPIRATORY_TRACT | Status: DC

## 2015-07-09 MED ORDER — IPRATROPIUM-ALBUTEROL 0.5-2.5 (3) MG/3ML IN SOLN
3.0000 mL | Freq: Four times a day (QID) | RESPIRATORY_TRACT | Status: DC
Start: 2015-07-09 — End: 2015-07-10
  Administered 2015-07-09 – 2015-07-10 (×4): 3 mL via RESPIRATORY_TRACT
  Filled 2015-07-09 (×4): qty 3

## 2015-07-09 NOTE — Progress Notes (Signed)
Upon arrival to patients room, SPO2 was in the 60's with a good waveform on 6 LPM nasal cannula. RR was 25 and patient had increased WOB. Patient was placed on BIPAP. WOB decreased and SPO2 increased to 99 on 60%. Breathing treatment started. RT will continue to monitor.

## 2015-07-09 NOTE — Progress Notes (Signed)
Name: Hector Sloan MRN: [id number] DOB: [birth year]    ADMISSION DATE:  07/13/2015 CONSULTATION DATE:  4/10  REFERRING MD :  TRH  CHIEF COMPLAINT:  SOB  BRIEF PATIENT DESCRIPTION: 75 year old male with recent admission for PNA now presenting with SOB requiring BiPAP and abnormal chest CT  SIGNIFICANT EVENTS  4/10 admit, BiPAP  STUDIES:  4/10 CT chest > Persistent extensive pleural and parenchymal lung disease. Could not exclude pleural tumor (mesothelioma). Extensive pleural calcifications could be related to previous trauma, hemorrhage, infection, pleurodesis or asbestos related pleural disease. Complex fluid collections in the major fissure and bilateral pleural thickening. Nodular opacities at both lung bases could reflect rounded atelectasis or tumor.Extensive lung disease.   HISTORY OF PRESENT ILLNESS:  75 year old male with PMH as below, which includes diabetes mellitus, hypertension, left BKA, bladder cancer (s/p transuretheral resection), chronic kidney disease was recently admitted to [facility] for HCAP (was also recently admitted for gout flare). He was treated with broad spectrum antibiotics, supplemental O2, and was eventually transitioned to oral levaquin and discharged to SNF. He was weaned off of O2 quickly in the facility.  Initially he was doing okay, and about 3 days prior to discharge he developed worsening cough and intermittent SOB. He was discharged 4/5 from SNF. Since arriving home SOB has continued to worsen and he presented to [facility] ED 4/10 with this complaint. He endorsed cough, but denied fever/chills/sputum production. In the ED he was noted to have lactic acid elevation and was hypoxemic. He was started on BiPAP. CT of the chest demonstrated bilateral airspace disease and loculated effusion. PCCM asked to see for further evaluation.   SUBJECTIVE:   VITAL SIGNS: Temp:  [97.7 F (36.5 C)-98.6 F (37 C)] 97.7 F (36.5 C) (04/11 1145) Pulse Rate:  [57-119] 96  (04/11 1200) Resp:  [13-31] 23 (04/11 1200) BP: (105-167)/(63-97) 148/88 mmHg (04/11 1200) SpO2:  [68 %-100 %] 96 % (04/11 1200) FiO2 (%):  [40 %-60 %] 40 % (04/11 0305) Weight:  [92.987 kg (205 lb)-96.344 kg (212 lb 6.4 oz)] 96.344 kg (212 lb 6.4 oz) (04/11 0500)  PHYSICAL EXAMINATION: General:  Elderly male in NAD on BiPAP Neuro:  Alert, oriented, non-focal HEENT:  [place]/AT, PERRL, no appreciable JVD Cardiovascular:  RRR, no MRG Lungs:  Bilateral rhonchi  Abdomen:  Obese, soft, non-tender, normoactive BS Musculoskeletal:  L BKA with prothesis in place Skin:  Grossly intact   Recent Labs Lab 07/24/2015 1155 07/21/2015 1227 07/09/15 0508  NA 138 137 142  K 4.3 5.4* 4.5  CL 104 107 111  CO2 19*  --  22  BUN 39* 56* 34*  CREATININE 2.87* 2.60* 2.53*  GLUCOSE 331* 333* 105*    Recent Labs Lab 07/26/2015 1155 07/07/2015 1227 07/09/15 0508  HGB 8.8* 9.9* 7.1*  HCT 27.4* 29.0* 22.1*  WBC 6.2  --  5.2  PLT 295  --  215   Ct Chest Wo Contrast  07/28/2015  CLINICAL DATA:  Four-day history of shortness of breath. History of bladder cancer. EXAM: CT CHEST WITHOUT CONTRAST TECHNIQUE: Multidetector CT imaging of the chest was performed following the standard protocol without IV contrast. COMPARISON:  Chest CT 05/10/2015 FINDINGS: Mediastinum/Nodes: No chest wall mass, supraclavicular or axillary lymphadenopathy. Small scattered lymph nodes are noted. The thyroid gland is grossly normal. The heart is mildly enlarged but stable. Stable three-vessel coronary artery calcifications. Extensive aortic calcifications without focal aneurysm. Stable borderline enlarged mediastinal lymph nodes. 11 mm nodule on image  22 is unchanged. 15 mm nodule on image number 23 previously measured 13.5 mm. No new nodules. Lungs/Pleura: Extensive persistent pleural and parenchymal lung disease. There is a persistent rim calcified loculated pleural fluid collection, likely related to previous infection or hemorrhage. There  are dense pleural calcifications on the right also. This could reflect asbestos related pleural disease. Persistent complex fluid collections in right major fissure. There is also rounded densities at both lung bases which could be round atelectasis. Tumor could not be excluded. Stable oblong density in the lingular region. Significant chronic lung disease with peribronchial thickening and areas of emphysematous change. Upper abdomen: No significant upper abdominal findings. Advanced aortic calcifications. Musculoskeletal: No significant bony findings. IMPRESSION: 1. Persistent extensive pleural and parenchymal lung disease. Could not exclude pleural tumor (mesothelioma). Extensive pleural calcifications could be related to previous trauma, hemorrhage, infection, pleurodesis or asbestos related pleural disease. 2. Complex fluid collections in the major fissure and bilateral pleural thickening. 3. Nodular opacities at both lung bases could reflect rounded atelectasis or tumor. 4. Extensive lung disease. 5. Stable borderline enlarged mediastinal lymph nodes. Electronically Signed   By: Marijo Sanes M.D.   On: 07/12/2015 14:05   Dg Chest Port 1 View  07/01/2015  CLINICAL DATA:  Shortness of breath today. EXAM: PORTABLE CHEST 1 VIEW COMPARISON:  05/13/2015 FINDINGS: There are small to moderate bilateral pleural effusions and bilateral airspace opacities, similar prior study. Mild cardiomegaly. No acute bony abnormality. No change since prior study. IMPRESSION: Bilateral airspace opacities and partially loculated moderate bilateral effusions. No real change. Electronically Signed   By: Rolm Baptise M.D.   On: 07/24/2015 12:36   I reviewed chest CT myself, pleural plaques, rounded atelectasis and masses noted.  ASSESSMENT / PLAN:  Acute hypoxemic respiratory failure in the setting of recurrent HCAP and bilateral loculated effusions.  - Agree broad spectrum antibiotics. - Trend WBC, fevers, PCT. - DNI status,  no intubation, BiPAP only if patient deteriorates and can protect his airway. - PRN bronchodilators - I spoke with the patient extensively bedside, informed him that there are multiple areas of concerns in his lung and that we will not be able to determine what they are without a biopsy.  He was clearly very adamant that he does not wish for aggressive interventions.  - Given the patient's wishes, recommend full DNR status (patient would like to speak with family but confirmed DNI for now), no involvement of CVTS and recommend involvement of palliative care. - Change BiPAP to PRN for comfort only and only if able to protect his airway. - Treat infection, keep dry.  Discussed with bedside RN and PCCM-NP.  PCCM will sign off, please call back if needed.  Rush Farmer, M.D. [facility] Pulmonary/Critical Care Medicine. Pager: [phone]. After hours pager: [phone].  07/09/2015 12:17 PM

## 2015-07-09 NOTE — Progress Notes (Signed)
[facility] TEAM 1 - Stepdown/ICU TEAM Progress Note  Hector Sloan [id number] DOB: 08-18-1940 DOA: 07/22/2015 PCP: [name], MD  Admit HPI / Brief Narrative: 75 year old BM PMHx DM Type 2 on insulin, DM Nephropathy, CKD stage IV, Normocytic anemia, Chronic Diastolic CHF, Pulmonary Hypertension,   Discharged from this facility on 05/14/15 after admission for H. He was sent to a rehabilitation facility and he was recently discharged this past Monday. Apparently throughout the duration of his time at the facility he was requiring nasal cannula oxygen but this was rapidly weaned and he was discharged home on room air. Patient reports that about 3 days prior to discharge from rehabilitation he noticed having increased coughing and increasing shortness of breath and weakness but had not had any fevers. Symptoms persisted since discharge home. He presented to the ER because of the symptoms. He has not had any abdominal pain, nausea or vomiting. He is been working with therapy at home. He is not having chest pain or palpitations.   HPI/Subjective:  4/11 A/O 4, positive acute respiratory distress, states normally not on O2. Just was discharged from SNF and states SPO2~90% on RA. States began to feel increasing SOB 24-48 hrs. after discharge from SNF area   Assessment/Plan: Acute respiratory failure with hypoxiaAnd hypercarbia /HCAP -Patient with progressive shortness of breath and upper respiratory infection type symptoms over the past 6-7 days in setting of known recent treatment for HCAP and persistent x-ray changes concerning for possible parapneumonic effusion  -Broad spectrum antibiotics continue Zosyn and vancomycin -Follow up on blood cultures, sputum culture, urinary legionella and strep -Influenza PCR ordered by ER physician -CT of chest to better clarify abnormal findings on chest x-ray i.e. possible parapneumonic effusion **CT scan has resulted with persistent extensive  pleural/parenchymal lung disease with extensive pleural calcifications as well as complex fluid collections in the major fissure and bilateral pleural thickening; they're also nodular opacities at both lung bases. I discussed with [facility] Dr. Oletta Darter and he will pass on need for pulmonary consultation which will either be done this evening by Dr. Wonda Amis or by one of the his partners in the morning -Patient has clarified he does not wish to be intubated -Respiratory virus panel pending -Influenza panel negative -Biopsy of LN, pleural-based nodules (Mesothelioma)? Will await [facility] M recommendation -DuoNeb QID , Elevated lactic acid level -Suspect related to acute infectious process as well as following depletion from poorly controlled diabetes -Cycle and follow trend -Patient is afebrile and does not have leukocytosis but does have tachycardia and elevated respiratory rate secondary to acute hypoxemia, he is mentating appropriately and he seemed hemodynamically stable therefore at this juncture does not meet criteria for sepsis   Acute renal failure superimposed on stage 3 chronic kidney disease  -Baseline renal function BUN 35 and creatinine 2.0 none -Normal saline 100 mL per hour and monitor for volume overload   DM, type 2, uncontrolled, with renal complications -Currently uncontrolled with CBGs greater than 33 -On oral agents at home including metformin so have discontinued -Given stage IV chronic kidney disease would not resume metformin -4/10 Hemoglobin A1c= 7.4 -Moderate SSI  Leg edema, right -Negative DVT/SVT     Normocytic anemia(Baseline hemoglobin between 7 and 8) -Suspect anemia related to patient's chronic kidney disease -Anemia panel pending -Patient likely is an appropriate candidate for erythropoietin therapy's?   Left ventricular diastolic dysfunction, NYHA class 1/Moderate to severe pulmonary hypertension  -No evidence of acute heart failure based on current  symptomatology -It's presented  with hypoxemia or completeness of evaluation will check BNP -Strict in and out -Daily weight Filed Weights   07/06/2015 1222 07/09/15 0500  Weight: 92.987 kg (205 lb) 96.344 kg (212 lb 6.4 oz)   -Transfuse for hemoglobin<8 -4/11 transfused 1 unit PRBC  Pulmonary hypertension    Code Status: DO NOT INTUBATE Family Communication: no family present at time of exam Disposition Plan: Await [facility] M recommendation    Consultants: [facility] M pending   Procedure/Significant Events: 4/10 CT chest WO contrast;-Persistent extensive pleural and parenchymal lung disease. Pleural tumor (mesothelioma)? -Extensive pleural calcifications; previous trauma/ hemorrhage infection, pleurodesis or asbestos related pleural disease?. -Complex fluid collections major fissure and bilateral pleural thickening. -Nodular opacities at both lung bases could reflect rounded atelectasis or tumor. -Extensive lung disease.-Stable borderline enlarged mediastinal lymph nodes 411 right lower extremity Doppler; negative DVT/SVT   Culture 4/10 MRSA by PCR positive 4/10 influenza A/B/H1N1 negative 4/10 HIV negative 4/11 blood 2 pending    Antibiotics: Zosyn 4/10 >> Vancomycin 4/10 >>   DVT prophylaxis: Lovenox   Devices    LINES / TUBES:      Continuous Infusions: . sodium chloride 100 mL/hr at 07/10/2015 2137    Objective: VITAL SIGNS: Temp: 97.7 F (36.5 C) (04/11 1515) Temp Source: Oral (04/11 1515) BP: 160/81 mmHg (04/11 1515) Pulse Rate: 98 (04/11 1515) SPO2; FIO2:   Intake/Output Summary (Last 24 hours) at 07/09/15 1709 Last data filed at 07/09/15 1515  Gross per 24 hour  Intake   2665 ml  Output    900 ml  Net   1765 ml     Exam: General:4/11 A/O 4, positive acute respiratory distress, Eyes: Negative headache, negative scleral hemorrhage ENT: Negative Runny nose, negative gingival bleeding, Neck:  Negative scars, masses, torticollis,  lymphadenopathy, JVD Lungs: diffuse poor air movement, positive expiratory wheezes negative crackles Cardiovascular: Tachycardic, Regular rhythm without murmur gallop or rub normal S1 and S2 Abdomen:negative abdominal pain, nondistended, positive soft, bowel sounds, no rebound, no ascites, no appreciable mass Extremities: No significant cyanosis, clubbing, or edema bilateral lower extremities Psychiatric:  Negative depression, negative anxiety, negative fatigue, negative mania  Neurologic:  Cranial nerves II through XII intact, tongue/uvula midline, all extremities muscle strength 5/5, sensation intact throughout, negative dysarthria, negative expressive aphasia, negative receptive aphasia.   Data Reviewed: Basic Metabolic Panel:  Recent Labs Lab 07/26/2015 1155 07/03/2015 1227 07/09/15 0508  NA 138 137 142  K 4.3 5.4* 4.5  CL 104 107 111  CO2 19*  --  22  GLUCOSE 331* 333* 105*  BUN 39* 56* 34*  CREATININE 2.87* 2.60* 2.53*  CALCIUM 8.8*  --  8.4*   Liver Function Tests:  Recent Labs Lab 07/09/15 0508  AST 13*  ALT 11*  ALKPHOS 56  BILITOT 0.7  PROT 6.7  ALBUMIN 2.1*   No results for input(s): LIPASE, AMYLASE in the last 168 hours. No results for input(s): AMMONIA in the last 168 hours. CBC:  Recent Labs Lab 06/29/2015 1155 07/15/2015 1227 07/09/15 0508  WBC 6.2  --  5.2  HGB 8.8* 9.9* 7.1*  HCT 27.4* 29.0* 22.1*  MCV 81.8  --  81.3  PLT 295  --  215   Cardiac Enzymes: No results for input(s): CKTOTAL, CKMB, CKMBINDEX, TROPONINI in the last 168 hours. BNP (last 3 results)  Recent Labs  05/09/15 1815 05/11/15 0203 07/23/2015 1714  BNP 132.0* 95.2 312.0*    ProBNP (last 3 results) No results for input(s): PROBNP in the last 8760 hours.  CBG:  Recent Labs Lab 07/23/2015 2243 07/09/15 0109 07/09/15 0436 07/09/15 0839 07/09/15 1250  GLUCAP 127* 96 102* 107* 121*    Recent Results (from the past 240 hour(s))  MRSA PCR Screening     Status: Abnormal    Collection Time: 07/07/2015  8:55 PM  Result Value Ref Range Status   MRSA by PCR POSITIVE (A) NEGATIVE Final    Comment:        The GeneXpert MRSA Assay (FDA approved for NASAL specimens only), is one component of a comprehensive MRSA colonization surveillance program. It is not intended to diagnose MRSA infection nor to guide or monitor treatment for MRSA infections. RESULT CALLED TO, READ BACK BY AND VERIFIED WITH: L BUNN,RN@[id number]  07/13/2015 MKELLY      Studies:  Recent x-ray studies have been reviewed in detail by the Attending Physician  Scheduled Meds:  Scheduled Meds: . brimonidine  1 drop Both Eyes Q12H   And  . timolol  1 drop Both Eyes Q12H  . Chlorhexidine Gluconate Cloth  6 each Topical Q0600  . enoxaparin (LOVENOX) injection  30 mg Subcutaneous Q24H  . insulin aspart  0-15 Units Subcutaneous 6 times per day  . ipratropium-albuterol  3 mL Nebulization Q6H  . mupirocin ointment  1 application Nasal BID  . piperacillin-tazobactam (ZOSYN)  IV  3.375 g Intravenous Q8H  . sodium chloride flush  3 mL Intravenous Q12H  . vancomycin  1,000 mg Intravenous Q24H    Time spent on care of this patient: 40 mins   Anarely Nicholls, Geraldo Docker , MD  [facility] Office  [phone] Pager - [phone]  On-Call/Text Page:      [email or web address]      password [email or web address]  If 7PM-7AM, please contact night-coverage [email or web address] Password [email or web address] 07/09/2015, 5:09 PM   LOS: 1 day   Care during the described time interval was provided by me .  I have reviewed this patient's available data, including medical history, events of note, physical examination, and all test results as part of my evaluation. I have personally reviewed and interpreted all radiology studies.   Dia Crawford, MD [phone] Pager

## 2015-07-10 LAB — RESPIRATORY VIRUS PANEL
Adenovirus: NEGATIVE
Influenza A: NEGATIVE
Influenza B: NEGATIVE
Metapneumovirus: NEGATIVE
PARAINFLUENZA 2 A: NEGATIVE
PARAINFLUENZA 3 A: NEGATIVE
Parainfluenza 1: NEGATIVE
RHINOVIRUS: NEGATIVE
Respiratory Syncytial Virus A: NEGATIVE
Respiratory Syncytial Virus B: NEGATIVE

## 2015-07-10 LAB — TYPE AND SCREEN
ABO/RH(D): B POS
Antibody Screen: NEGATIVE
UNIT DIVISION: 0

## 2015-07-10 LAB — BLOOD GAS, ARTERIAL
Acid-base deficit: 3.2 mmol/L — ABNORMAL HIGH (ref 0.0–2.0)
Bicarbonate: 22.3 mEq/L (ref 20.0–24.0)
Drawn by: 44898
O2 Content: 6 L/min
O2 SAT: 94.5 %
PCO2 ART: 47.9 mmHg — AB (ref 35.0–45.0)
PH ART: 7.29 — AB (ref 7.350–7.450)
PO2 ART: 82.8 mmHg (ref 80.0–100.0)
Patient temperature: 98.6
TCO2: 23.8 mmol/L (ref 0–100)

## 2015-07-10 LAB — PROCALCITONIN: PROCALCITONIN: 0.1 ng/mL

## 2015-07-10 LAB — GLUCOSE, CAPILLARY
GLUCOSE-CAPILLARY: 172 mg/dL — AB (ref 65–99)
Glucose-Capillary: 227 mg/dL — ABNORMAL HIGH (ref 65–99)
Glucose-Capillary: 123 mg/dL — ABNORMAL HIGH (ref 65–99)
Glucose-Capillary: 89 mg/dL (ref 65–99)
Glucose-Capillary: 129 mg/dL — ABNORMAL HIGH (ref 65–99)
Glucose-Capillary: 157 mg/dL — ABNORMAL HIGH (ref 65–99)

## 2015-07-10 MED ORDER — INSULIN ASPART 100 UNIT/ML ~~LOC~~ SOLN
0.0000 [IU] | Freq: Every day | SUBCUTANEOUS | Status: DC
  Administered 2015-07-14: 0 [IU] via SUBCUTANEOUS

## 2015-07-10 MED ORDER — IPRATROPIUM-ALBUTEROL 0.5-2.5 (3) MG/3ML IN SOLN
3.0000 mL | Freq: Four times a day (QID) | RESPIRATORY_TRACT | Status: DC
  Administered 2015-07-10 – 2015-07-17 (×27): 3 mL via RESPIRATORY_TRACT
  Filled 2015-07-10 (×27): qty 3

## 2015-07-10 MED ORDER — MORPHINE SULFATE (PF) 2 MG/ML IV SOLN
1.0000 mg | INTRAVENOUS | Status: DC | PRN
  Administered 2015-07-11 – 2015-07-14 (×4): 2 mg via INTRAVENOUS
  Filled 2015-07-10 (×4): qty 1

## 2015-07-10 MED ORDER — ALBUTEROL SULFATE (2.5 MG/3ML) 0.083% IN NEBU
2.5000 mg | INHALATION_SOLUTION | RESPIRATORY_TRACT | Status: DC | PRN
  Administered 2015-07-13: 2.5 mg via RESPIRATORY_TRACT
  Filled 2015-07-10: qty 3

## 2015-07-10 MED ORDER — INSULIN ASPART 100 UNIT/ML ~~LOC~~ SOLN
0.0000 [IU] | Freq: Three times a day (TID) | SUBCUTANEOUS | Status: DC
  Administered 2015-07-11: 3 [IU] via SUBCUTANEOUS
  Administered 2015-07-11: 8 [IU] via SUBCUTANEOUS

## 2015-07-10 NOTE — Progress Notes (Signed)
Pt w/ increased WOB, also desat 84-86%.  Placed pt back on bipap, pt tol well so far,sat now 96-99%.  No distress noted.  Pt denies SOB on bipap now.

## 2015-07-10 NOTE — Evaluation (Signed)
Physical Therapy Evaluation Patient Details Name: Hector Sloan MRN: [id number] DOB: [birth year] Today's Date: 07/10/2015   History of Present Illness  75 year old male patient with a past medical history of diabetes on insulin, diabetic nephropathy with chronic kidney disease stage IV, normocytic anemia, grade 1 diastolic dysfunction with mild focal basal hypertrophy based on echocardiogram previous admission, preserved LV function, severely elevated pulmonary pressures with mild TR also noted on echo last admission. He was discharged from this facility on 05/14/15 after admission for H. He was sent to a rehabilitation facility and he was recently discharged this past Monday. Apparently throughout the duration of his time at the facility he was requiring nasal cannula oxygen but this was rapidly weaned and he was discharged home on room air. Patient reports that about 3 days prior to discharge from rehabilitation he noticed having increased coughing and increasing shortness of breath and weakness but had not had any fevers. Symptoms persisted since discharge home. He presented to the ER because of the symptoms.     Clinical Impression  Pt admitted with above diagnosis. Pt currently with functional limitations due to the deficits listed below (see PT Problem List). Pt very deconditioned and requiring a lot of assistance for functional mobility. Pt has very labored breathing and SpO2 varied from 70%-88% on 6L [place] throughout activity and requires VC's for pursed lip breathing and increased to 94% at rest. Pt will benefit from skilled PT to increase their independence and safety with mobility to allow discharge to the venue listed below.  Recommending pt return to SNF at d/c due to increased assistance needs at this time and lack of caregiver support.      Follow Up Recommendations SNF;Supervision/Assistance - 24 hour    Equipment Recommendations  None recommended by PT    Recommendations for Other  Services OT consult     Precautions / Restrictions Precautions Precautions: Fall Required Braces or Orthoses: Other Brace/Splint Other Brace/Splint: L BK prosthesis Restrictions Weight Bearing Restrictions: No      Mobility  Bed Mobility Overal bed mobility: Needs Assistance Bed Mobility: Supine to Sit     Supine to sit: Mod assist;HOB elevated     General bed mobility comments: Pt able to bring his legs off the bed, but needed Mod A to bring his trunk upright.   Transfers Overall transfer level: Needs assistance Equipment used: Rolling walker (2 wheeled) Transfers: Sit to/from [facility] Sit to Stand: Mod assist;+2 physical assistance;From elevated surface Stand pivot transfers: Mod assist;+2 physical assistance;From elevated surface       General transfer comment: Mod A +2 to power up into standing. VC's for sequencing. Pt able to shuffle his feet over to the chair but was very reliant on UE support through the RW and +2 Mod A for balance.   Ambulation/Gait             General Gait Details: pt unable at this time  Stairs            Wheelchair Mobility    Modified Rankin (Stroke Patients Only)       Balance Overall balance assessment: Needs assistance Sitting-balance support: Bilateral upper extremity supported;Feet supported Sitting balance-Leahy Scale: Fair Sitting balance - Comments: Pt able to sit EOB with supervsion.    Standing balance support: Bilateral upper extremity supported Standing balance-Leahy Scale: Poor Standing balance comment: Reliant on UE support and +2 Mod A  Pertinent Vitals/Pain Pain Assessment: No/denies pain    Home Living Family/patient expects to be discharged to:: Private residence Living Arrangements: Alone Available Help at Discharge: Friend(s) Type of Home: House Home Access: Stairs to enter Entrance Stairs-Rails: Right;Left;Can reach both Entrance  Stairs-Number of Steps: 4-5 Home Layout: One level Home Equipment: Cane - single point;Walker - 2 wheels;Wheelchair - manual      Prior Function Level of Independence: Independent with assistive device(s)         Comments: Pt recently d/c from SNF from previous hospital stay. He was working with therapy at home. States he was using a cane to walk short distances in his house.      Hand Dominance   Dominant Hand: Right    Extremity/Trunk Assessment   Upper Extremity Assessment: Generalized weakness           Lower Extremity Assessment: Generalized weakness      Cervical / Trunk Assessment: Kyphotic  Communication   Communication: HOH  Cognition Arousal/Alertness: Awake/alert Behavior During Therapy: Flat affect Overall Cognitive Status: Within Functional Limits for tasks assessed                      General Comments General comments (skin integrity, edema, etc.): Pt pleasant but needed some encouragement to participate in therapy. He was very fatigued after therapy session.     Exercises        Assessment/Plan    PT Assessment Patient needs continued PT services  PT Diagnosis Difficulty walking;Abnormality of gait;Generalized weakness   PT Problem List Decreased strength;Decreased activity tolerance;Decreased balance;Decreased mobility;Decreased knowledge of use of DME;Cardiopulmonary status limiting activity  PT Treatment Interventions DME instruction;Gait training;Stair training;Functional mobility training;Therapeutic activities;Therapeutic exercise;Balance training;Patient/family education   PT Goals (Current goals can be found in the Care Plan section) Acute Rehab PT Goals Patient Stated Goal: to get stronger PT Goal Formulation: With patient Time For Goal Achievement: 07/24/15 Potential to Achieve Goals: Good    Frequency Min 2X/week   Barriers to discharge Decreased caregiver support pt lives alone    Co-evaluation                End of Session Equipment Utilized During Treatment: Gait belt;Oxygen Activity Tolerance: Patient limited by fatigue Patient left: in chair;with call bell/phone within reach;with chair alarm set Nurse Communication: Mobility status         Time: 0922-0952 PT Time Calculation (min) (ACUTE ONLY): 30 min   Charges:   PT Evaluation $PT Eval Moderate Complexity: 1 Procedure PT Treatments $Therapeutic Activity: 8-22 mins   PT G Codes:       Colon Branch, SPT Colon Branch 07/10/2015, 11:22 AM

## 2015-07-10 NOTE — Progress Notes (Addendum)
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 He was sent to a rehabilitation facility and eventually discharged home. Apparently throughout the duration of his time at the facility he was requiring nasal cannula oxygen but this was rapidly weaned and he was discharged home on room air. Patient reported that 3 days prior to discharge from rehabilitation he developed increased coughing and increasing shortness of breath and weakness.   HPI/Subjective: Pt has been off BIPAP most of the day, but has had to return to BIPAP this afternoon due to desaturations.  He denies current sob, n/v, abdom pain, or cp.    Assessment/Plan:  Pneumonia - Acute respiratory failure with hypoxia and hypercarbia CT chest revealed extensive pleural/parenchymal lung disease with extensive pleural calcifications as well as complex fluid collections in the major fissure and bilateral pleural thickening, as well as nodular opacities at both lung bases - the pt is adamantly opposed to bx or further aggressive investigation - the plan presently is to cont empiric abx tx and supportive care and follow   Acute renal failure superimposed on stage 3 CKD Baseline creatinine 2.0 - crt slowly improving - follow trend   DM2, uncontrolled, with renal complications [id number] 7.4 - CBG currently well controlled    R LE edema Negative DVT / SVT   Chronic Normocytic anemia baseline hemoglobin 7-8 - related to patient's chronic kidney disease - no evidence of acute blood loss - transfused 1U PRBC yesterday - f/u Hgb in AM   Left ventricular diastolic dysfunction (grade 1) - Moderate to severe pulmonary hypertension  No  evidence of acute heart failure presently Filed Weights   07/21/2015 1222 07/09/15 0500 07/10/15 0400  Weight: 92.987 kg (205 lb) 96.344 kg (212 lb 6.4 oz) 98.703 kg (217 lb 9.6 oz)   MRSA screen +  Code Status: DO NOT INTUBATE Family Communication: spoke w/ niece at bedside  Disposition Plan: SDU  Consultants: PCCM  Procedures: 4/11 right lower extremity Doppler - negative DVT/SVT  Antibiotics: Zosyn 4/10 > Vancomycin 4/10 >  DVT prophylaxis: lovenox   Objective: Blood pressure 141/72, pulse 106, temperature 97.8 F (36.6 C), temperature source Axillary, resp. rate 21, height 6\' 2"  (1.88 m), weight 98.703 kg (217 lb 9.6 oz), SpO2 95 %.  Intake/Output Summary (Last 24 hours) at 07/10/15 1617 Last data filed at 07/10/15 1500  Gross per 24 hour  Intake   1373 ml  Output   1250 ml  Net    123 ml   Exam: General: currently tolerating BIPAP well - alert and conversant  Lungs: poor air movement B bases - no wheeze  Cardiovascular: Regular rate and rhythm without murmur Abdomen: Nontender, nondistended, soft, bowel sounds positive, no rebound, no ascites, no appreciable mass Extremities: No significant cyanosis, or clubbing - 1+ edema bilateral lower extremities  Data Reviewed:  Basic Metabolic Panel:  Recent Labs Lab 07/12/2015 1155 07/21/2015 1227 07/09/15 0508  NA 138 137 142  K 4.3 5.4* 4.5  CL 104 107 111  CO2 19*  --  22  GLUCOSE 331* 333* 105*  BUN 39* 56* 34*  CREATININE 2.87* 2.60* 2.53*  CALCIUM 8.8*  --  8.4*    CBC:  Recent Labs Lab 06/30/2015  1155 07/19/2015 1227 07/09/15 0508  WBC 6.2  --  5.2  HGB 8.8* 9.9* 7.1*  HCT 27.4* 29.0* 22.1*  MCV 81.8  --  81.3  PLT 295  --  215    Liver Function Tests:  Recent Labs Lab 07/09/15 0508  AST 13*  ALT 11*  ALKPHOS 56  BILITOT 0.7  PROT 6.7  ALBUMIN 2.1*   CBG:  Recent Labs Lab 07/09/15 2106 07/10/15 0006 07/10/15 0446 07/10/15 0808 07/10/15 1215  GLUCAP 278* 227* 123* 89 172*     Recent Results (from the past 240 hour(s))  MRSA PCR Screening     Status: Abnormal   Collection Time: 07/15/2015  8:55 PM  Result Value Ref Range Status   MRSA by PCR POSITIVE (A) NEGATIVE Final    Comment:        The GeneXpert MRSA Assay (FDA approved for NASAL specimens only), is one component of a comprehensive MRSA colonization surveillance program. It is not intended to diagnose MRSA infection nor to guide or monitor treatment for MRSA infections. RESULT CALLED TO, READ BACK BY AND VERIFIED WITH: L BUNN,RN@[id number]  [id number] MKELLY   Blood culture (routine x 2)     Status: None (Preliminary result)   Collection Time: 07/09/15 12:05 AM  Result Value Ref Range Status   Specimen Description BLOOD RIGHT ARM  Final   Special Requests BOTTLES DRAWN AEROBIC AND ANAEROBIC 5CC  Final   Culture NO GROWTH 1 DAY  Final   Report Status PENDING  Incomplete  Blood culture (routine x 2)     Status: None (Preliminary result)   Collection Time: 07/09/15 12:34 AM  Result Value Ref Range Status   Specimen Description BLOOD RIGHT HAND  Final   Special Requests IN PEDIATRIC BOTTLE 3CC  Final   Culture NO GROWTH 1 DAY  Final   Report Status PENDING  Incomplete     Studies:   Recent x-ray studies have been reviewed in detail by the Attending Physician  Scheduled Meds:  Scheduled Meds: . brimonidine  1 drop Both Eyes Q12H   And  . timolol  1 drop Both Eyes Q12H  . Chlorhexidine Gluconate Cloth  6 each Topical Q0600  . enoxaparin (LOVENOX) injection  30 mg Subcutaneous Q24H  . insulin aspart  0-15 Units Subcutaneous 6 times per day  . ipratropium-albuterol  3 mL Nebulization Q6H  . mupirocin ointment  1 application Nasal BID  . piperacillin-tazobactam (ZOSYN)  IV  3.375 g Intravenous Q8H  . sodium chloride flush  3 mL Intravenous Q12H  . vancomycin  1,000 mg Intravenous Q24H    Time spent on care of this patient: 35 mins   MCCLUNG,JEFFREY T , MD   [facility] Office   [phone] Pager - Text Page per Shea Evans as per below:  On-Call/Text Page:      [email or web address]      password [email or web address]  If 7PM-7AM, please contact night-coverage [email or web address] Password [email or web address] 07/10/2015, 4:17 PM   LOS: 2 days

## 2015-07-11 LAB — GLUCOSE, CAPILLARY
GLUCOSE-CAPILLARY: 157 mg/dL — AB (ref 65–99)
GLUCOSE-CAPILLARY: 281 mg/dL — AB (ref 65–99)
Glucose-Capillary: 92 mg/dL (ref 65–99)
Glucose-Capillary: 179 mg/dL — ABNORMAL HIGH (ref 65–99)

## 2015-07-11 LAB — CBC
HEMATOCRIT: 27.8 % — AB (ref 39.0–52.0)
Hemoglobin: 8.6 g/dL — ABNORMAL LOW (ref 13.0–17.0)
MCH: 25.6 pg — ABNORMAL LOW (ref 26.0–34.0)
MCHC: 30.9 g/dL (ref 30.0–36.0)
MCV: 82.7 fL (ref 78.0–100.0)
PLATELETS: 214 10*3/uL (ref 150–400)
RBC: 3.36 MIL/uL — AB (ref 4.22–5.81)
RDW: 19 % — AB (ref 11.5–15.5)
WBC: 5.6 10*3/uL (ref 4.0–10.5)

## 2015-07-11 LAB — BASIC METABOLIC PANEL
Anion gap: 11 (ref 5–15)
BUN: 28 mg/dL — AB (ref 6–20)
CHLORIDE: 111 mmol/L (ref 101–111)
CO2: 20 mmol/L — AB (ref 22–32)
CREATININE: 2.16 mg/dL — AB (ref 0.61–1.24)
Calcium: 8.5 mg/dL — ABNORMAL LOW (ref 8.9–10.3)
GFR calc Af Amer: 32 mL/min — ABNORMAL LOW (ref >60)
GFR, EST NON AFRICAN AMERICAN: 28 mL/min — AB (ref >60)
Glucose, Bld: 106 mg/dL — ABNORMAL HIGH (ref 65–99)
POTASSIUM: 4.3 mmol/L (ref 3.5–5.1)
Sodium: 142 mmol/L (ref 135–145)

## 2015-07-11 LAB — LEGIONELLA PNEUMOPHILA SEROGP 1 UR AG: L. PNEUMOPHILA SEROGP 1 UR AG: NEGATIVE

## 2015-07-11 NOTE — Progress Notes (Signed)
CSW consulted for SNF placement.  CSW met with pt at bedside to discuss PT recommendation- pt states that he was recently DC'd from SNF and does not want to go back.  Reports he had been set up with home health from the SNF and that he would want to go home with home health instead.  Pt reports that he lives alone but has lots of family support.  CSW informed RNCM who will follow up concerning home health needs  CSW signing off. Please reconsult if needed  Domenica Reamer, [place] Social Worker [phone]

## 2015-07-11 NOTE — Progress Notes (Signed)
Patient unable to void. Bladder scan was 295. Patient request not to have foley placed. Patient voided 150cc of yellow urine.

## 2015-07-11 NOTE — Progress Notes (Signed)
[place] TEAM 1 - Stepdown/ICU TEAM Progress Note  Hector Sloan [id number] DOB: 22-Apr-1940 DOA: 07/09/2015 PCP: [name], MD  Admit HPI / Brief Narrative: 75 year old BM PMHx DM Type 2 on insulin, DM Nephropathy, CKD stage IV, Normocytic anemia, Chronic Diastolic CHF, Pulmonary Hypertension,   Discharged from this facility on 05/14/15 after admission for H. He was sent to a rehabilitation facility and he was recently discharged this past Monday. Apparently throughout the duration of his time at the facility he was requiring nasal cannula oxygen but this was rapidly weaned and he was discharged home on room air. Patient reports that about 3 days prior to discharge from rehabilitation he noticed having increased coughing and increasing shortness of breath and weakness but had not had any fevers. Symptoms persisted since discharge home. He presented to the ER because of the symptoms. He has not had any abdominal pain, nausea or vomiting. He is been working with therapy at home. He is not having chest pain or palpitations.   HPI/Subjective:  4/13 A/O 4, positive acute respiratory distress, states normally not on O2. Verify his conversation with [facility] M that he did not want any invasive intervention.    Assessment/Plan: Acute respiratory failure with hypoxiaAnd hypercarbia /HCAP/Mesothelioma? -Patient with progressive shortness of breath and upper respiratory infection type symptoms over the past 6-7 days in setting of known recent treatment for HCAP and persistent x-ray changes concerning for possible parapneumonic effusion  -Broad spectrum antibiotics continue Zosyn and vancomycin -Follow up on blood cultures, sputum culture, urinary legionella and strep -Influenza PCR ordered by ER physician -CT of chest; chest findings consistent with malignancy. Patient would need biopsy to confirm see results below  -Patient has clarified he does not wish to be intubated -Respiratory virus  panel negative -Influenza panel negative -Biopsy of LN, pleural-based nodules (Mesothelioma)? Patient has spoken with [facility] M and refuses any further intervention.  -Spoke with patient in general concerning hospice. Have placed consult to Palliative Care will await their recommendation -DuoNeb QID , Acute renal failure superimposed on stage 3 chronic kidney disease  -Baseline renal function BUN 35 and creatinine 2.0 none -Normal saline 100 mL per hour and monitor for volume overload   DM, type 2, uncontrolled, with renal complications -Currently uncontrolled with CBGs greater than 33 -Metformin DC'd  -Given stage IV chronic kidney disease would not resume metformin -4/10 Hemoglobin A1c= 7.4 -Moderate SSI  Leg edema, right -Negative DVT/SVT     Normocytic anemia(Baseline hemoglobin between 7 and 8) -Suspect anemia related to patient's chronic kidney disease -Anemia panel pending -Patient likely is an appropriate candidate for erythropoietin therapy's?   Left ventricular diastolic dysfunction, NYHA class 1/Moderate to severe pulmonary hypertension  -No evidence of acute heart failure based on current symptomatology -It's presented with hypoxemia or completeness of evaluation will check BNP -Strict in and out -Daily weight Filed Weights   07/09/15 0500 07/10/15 0400 07/11/15 0500  Weight: 96.344 kg (212 lb 6.4 oz) 98.703 kg (217 lb 9.6 oz) 97.705 kg (215 lb 6.4 oz)   -Transfuse for hemoglobin<8 -4/11 transfused 1 unit PRBC   Goals of care -Consulted palliative Care;Patient with poor respiratory status possible mesothelioma; requested [facility] M biopsy however patient does not want invasive procedures. Long-term vs short-term goals of care; full DO NOT RESUSCITATE; hospice?  Code Status: DO NOT INTUBATE Family Communication: no family present at time of exam Disposition Plan: Await [facility] M recommendation    Consultants: [facility] M pending   Procedure/Significant Events: 4/10 CT  chest WO contrast;-Persistent extensive pleural and parenchymal lung disease. Pleural tumor (mesothelioma)? -Extensive pleural calcifications; previous trauma/ hemorrhage infection, pleurodesis or asbestos related pleural disease?. -Complex fluid collections major fissure and bilateral pleural thickening. -Nodular opacities at both lung bases could reflect rounded atelectasis or tumor. -Extensive lung disease.-Stable borderline enlarged mediastinal lymph nodes 411 right lower extremity Doppler; negative DVT/SVT   Culture 4/10 MRSA by PCR positive 4/10 influenza A/B/H1N1 negative 4/10 HIV negative 4/11Right arm/hand NGTD 4/11 respiratory virus panel negative    Antibiotics: Zosyn 4/10 >> Vancomycin 4/10 >>   DVT prophylaxis: Lovenox   Devices    LINES / TUBES:      Continuous Infusions: . sodium chloride 50 mL/hr at 07/10/15 1924    Objective: VITAL SIGNS: Temp: 98.3 F (36.8 C) (04/14 0425) Temp Source: Axillary (04/14 0425) BP: 166/92 mmHg (04/14 0425) Pulse Rate: 95 (04/14 0425) SPO2; FIO2:   Intake/Output Summary (Last 24 hours) at 07/12/15 [id number] Last data filed at 07/12/15 0700  Gross per 24 hour  Intake   2053 ml  Output    875 ml  Net   1178 ml     Exam: General:4/11 A/O 4, positive acute respiratory distress, Eyes: Negative headache, negative scleral hemorrhage ENT: Negative Runny nose, negative gingival bleeding, Neck:  Negative scars, masses, torticollis, lymphadenopathy, JVD Lungs: diffuse poor air movement, positive expiratory wheezes negative crackles Cardiovascular: Tachycardic, Regular rhythm without murmur gallop or rub normal S1 and S2 Abdomen:negative abdominal pain, nondistended, positive soft, bowel sounds, no rebound, no ascites, no appreciable mass Extremities: No significant cyanosis, clubbing, or edema bilateral lower extremities Psychiatric:  Negative depression, negative anxiety, negative fatigue, negative mania  Neurologic:   Cranial nerves II through XII intact, tongue/uvula midline, all extremities muscle strength 5/5, sensation intact throughout, negative dysarthria, negative expressive aphasia, negative receptive aphasia.   Data Reviewed: Basic Metabolic Panel:  Recent Labs Lab 07/18/2015 1155 07/26/2015 1227 07/09/15 0508 07/11/15 0339  NA 138 137 142 142  K 4.3 5.4* 4.5 4.3  CL 104 107 111 111  CO2 19*  --  22 20*  GLUCOSE 331* 333* 105* 106*  BUN 39* 56* 34* 28*  CREATININE 2.87* 2.60* 2.53* 2.16*  CALCIUM 8.8*  --  8.4* 8.5*   Liver Function Tests:  Recent Labs Lab 07/09/15 0508  AST 13*  ALT 11*  ALKPHOS 56  BILITOT 0.7  PROT 6.7  ALBUMIN 2.1*   No results for input(s): LIPASE, AMYLASE in the last 168 hours. No results for input(s): AMMONIA in the last 168 hours. CBC:  Recent Labs Lab 07/24/2015 1155 07/21/2015 1227 07/09/15 0508 07/11/15 0339  WBC 6.2  --  5.2 5.6  HGB 8.8* 9.9* 7.1* 8.6*  HCT 27.4* 29.0* 22.1* 27.8*  MCV 81.8  --  81.3 82.7  PLT 295  --  215 214   Cardiac Enzymes: No results for input(s): CKTOTAL, CKMB, CKMBINDEX, TROPONINI in the last 168 hours. BNP (last 3 results)  Recent Labs  05/09/15 1815 05/11/15 0203 07/14/2015 1714  BNP 132.0* 95.2 312.0*    ProBNP (last 3 results) No results for input(s): PROBNP in the last 8760 hours.  CBG:  Recent Labs Lab 07/10/15 2153 07/11/15 0853 07/11/15 1241 07/11/15 1658 07/11/15 2143  GLUCAP 157* 92 157* 281* 179*    Recent Results (from the past 240 hour(s))  MRSA PCR Screening     Status: Abnormal   Collection Time: 07/25/2015  8:55 PM  Result Value Ref Range Status   MRSA by PCR POSITIVE (  A) NEGATIVE Final    Comment:        The GeneXpert MRSA Assay (FDA approved for NASAL specimens only), is one component of a comprehensive MRSA colonization surveillance program. It is not intended to diagnose MRSA infection nor to guide or monitor treatment for MRSA infections. RESULT CALLED TO, READ BACK  BY AND VERIFIED WITH: L BUNN,RN@[id number]  07/16/2015 MKELLY   Blood culture (routine x 2)     Status: None (Preliminary result)   Collection Time: 07/09/15 12:05 AM  Result Value Ref Range Status   Specimen Description BLOOD RIGHT ARM  Final   Special Requests BOTTLES DRAWN AEROBIC AND ANAEROBIC 5CC  Final   Culture NO GROWTH 2 DAYS  Final   Report Status PENDING  Incomplete  Blood culture (routine x 2)     Status: None (Preliminary result)   Collection Time: 07/09/15 12:34 AM  Result Value Ref Range Status   Specimen Description BLOOD RIGHT HAND  Final   Special Requests IN PEDIATRIC BOTTLE 3CC  Final   Culture NO GROWTH 2 DAYS  Final   Report Status PENDING  Incomplete  Respiratory virus panel     Status: None   Collection Time: 07/09/15  9:46 AM  Result Value Ref Range Status   Respiratory Syncytial Virus A Negative Negative Final   Respiratory Syncytial Virus B Negative Negative Final   Influenza A Negative Negative Final   Influenza B Negative Negative Final   Parainfluenza 1 Negative Negative Final   Parainfluenza 2 Negative Negative Final   Parainfluenza 3 Negative Negative Final   Metapneumovirus Negative Negative Final   Rhinovirus Negative Negative Final   Adenovirus Negative Negative Final    Comment: (NOTE) Performed At: [facility] [place], [place] [id number] Lindon Romp MD [id number]      Studies:  Recent x-ray studies have been reviewed in detail by the Attending Physician  Scheduled Meds:  Scheduled Meds: . brimonidine  1 drop Both Eyes Q12H   And  . timolol  1 drop Both Eyes Q12H  . Chlorhexidine Gluconate Cloth  6 each Topical Q0600  . enoxaparin (LOVENOX) injection  30 mg Subcutaneous Q24H  . insulin aspart  0-15 Units Subcutaneous TID WC  . insulin aspart  0-5 Units Subcutaneous QHS  . ipratropium-albuterol  3 mL Nebulization QID  . mupirocin ointment  1 application Nasal BID  . piperacillin-tazobactam (ZOSYN)  IV   3.375 g Intravenous Q8H  . sodium chloride flush  3 mL Intravenous Q12H  . vancomycin  1,000 mg Intravenous Q24H    Time spent on care of this patient: 40 mins   WOODS, Geraldo Docker , MD  [facility] Office  [phone] Pager - [phone]  On-Call/Text Page:      [email or web address]      password [email or web address]  If 7PM-7AM, please contact night-coverage [email or web address] Password [email or web address] 07/12/2015, 8:11 AM   LOS: 4 days   Care during the described time interval was provided by me .  I have reviewed this patient's available data, including medical history, events of note, physical examination, and all test results as part of my evaluation. I have personally reviewed and interpreted all radiology studies.   Dia Crawford, MD [phone] Pager

## 2015-07-11 NOTE — Progress Notes (Signed)
Pharmacy Antibiotic Note  Hector Sloan is a 75 y.o. male admitted on 07/02/2015 with sepsis.  Presents with SOB needing BiPAP and abnormal chest CT.  Recently admitted for HCAP, previously admitted for gout flare.  Pharmacy has been consulted for Vancomycin and Zosyn dosing.    Vancomycin trough was ordered for tonight but nurse had already started vancomycin infusion. Will reschedule level for tomorrow.  Plan: Vancomycin 1g IV q24h Zosyn 3.375g IV q8h Monitor culture data, renal function and clinical course VT tomorrow evening   Height: 6\' 2"  (188 cm) Weight: 215 lb 6.4 oz (97.705 kg) IBW/kg (Calculated) : 82.2  Temp (24hrs), Avg:98.3 F (36.8 C), Min:97.5 F (36.4 C), Max:99.6 F (37.6 C)   Recent Labs Lab 07/07/2015 1155 07/11/2015 1224 07/07/2015 1227 07/19/2015 1725 07/09/15 0508 07/11/15 0339  WBC 6.2  --   --   --  5.2 5.6  CREATININE 2.87*  --  2.60*  --  2.53* 2.16*  LATICACIDVEN  --  3.73*  --  1.47  --   --     Estimated Creatinine Clearance: 33.8 mL/min (by C-G formula based on Cr of 2.16).    No Known Allergies  Antimicrobials this admission: 4/10 Zosyn >>  4/10 Vanc >>   Dose adjustments this admission:   Microbiology results: 4/11 BCx: ngtd 4/11 RVP: neg 4/10 MRSA PCR: pos     Andrey Cota. Diona Foley, PharmD, [place] Clinical Pharmacist Pager [phone]  07/11/2015 8:15 PM

## 2015-07-12 DIAGNOSIS — E1121 Type 2 diabetes mellitus with diabetic nephropathy: Secondary | ICD-10-CM | POA: Diagnosis present

## 2015-07-12 DIAGNOSIS — Z7189 Other specified counseling: Secondary | ICD-10-CM

## 2015-07-12 DIAGNOSIS — Z515 Encounter for palliative care: Secondary | ICD-10-CM | POA: Diagnosis present

## 2015-07-12 DIAGNOSIS — R0600 Dyspnea, unspecified: Secondary | ICD-10-CM | POA: Diagnosis present

## 2015-07-12 DIAGNOSIS — I10 Essential (primary) hypertension: Secondary | ICD-10-CM

## 2015-07-12 LAB — GLUCOSE, CAPILLARY
GLUCOSE-CAPILLARY: 106 mg/dL — AB (ref 65–99)
GLUCOSE-CAPILLARY: 209 mg/dL — AB (ref 65–99)
Glucose-Capillary: 291 mg/dL — ABNORMAL HIGH (ref 65–99)
Glucose-Capillary: 173 mg/dL — ABNORMAL HIGH (ref 65–99)

## 2015-07-12 LAB — PROCALCITONIN: Procalcitonin: 0.14 ng/mL

## 2015-07-12 LAB — VANCOMYCIN, TROUGH: Vancomycin Tr: 26 ug/mL (ref 10.0–20.0)

## 2015-07-12 MED ORDER — LORAZEPAM 0.5 MG PO TABS
0.5000 mg | ORAL_TABLET | Freq: Four times a day (QID) | ORAL | Status: DC | PRN
  Administered 2015-07-14 – 2015-07-15 (×3): 0.5 mg via ORAL
  Filled 2015-07-12 (×4): qty 1

## 2015-07-12 MED ORDER — METOPROLOL TARTRATE 25 MG PO TABS
25.0000 mg | ORAL_TABLET | Freq: Two times a day (BID) | ORAL | Status: DC
  Administered 2015-07-12 – 2015-07-13 (×3): 25 mg via ORAL
  Filled 2015-07-12 (×3): qty 1

## 2015-07-12 MED ORDER — INSULIN ASPART 100 UNIT/ML ~~LOC~~ SOLN: 0.0000 [IU] | Freq: Three times a day (TID) | SUBCUTANEOUS | Status: DC

## 2015-07-12 MED ORDER — INSULIN ASPART 100 UNIT/ML ~~LOC~~ SOLN
0.0000 [IU] | SUBCUTANEOUS | Status: DC
  Administered 2015-07-12: 7 [IU] via SUBCUTANEOUS
  Administered 2015-07-12: 11 [IU] via SUBCUTANEOUS

## 2015-07-12 MED ORDER — MORPHINE SULFATE (CONCENTRATE) 10 MG/0.5ML PO SOLN
5.0000 mg | ORAL | Status: DC | PRN
  Administered 2015-07-12 – 2015-07-15 (×2): 5 mg via ORAL
  Filled 2015-07-12 (×2): qty 0.5

## 2015-07-12 MED ORDER — VANCOMYCIN HCL IN DEXTROSE 1-5 GM/200ML-% IV SOLN
1000.0000 mg | INTRAVENOUS | Status: DC
  Administered 2015-07-13: 1000 mg via INTRAVENOUS
  Filled 2015-07-12: qty 200

## 2015-07-12 NOTE — Consult Note (Signed)
Consultation Note Date: 07/12/2015   Patient Name: Hector Sloan  DOB: Dec 23, 1940  MRN: [id number]  Age / Sex: 75 y.o., male  PCP: Willey Blade, MD Referring Physician: Allie Bossier, MD  Reason for Consultation: Establishing goals of care  75 yo male with pulm HTN, D-HF, CKD IV, Bladder cancer, and left BKA, who has had recurrent pneumonia.  Admitted 4/10 with recurrent PNA and acute hypoxic resp failure.  Has required Bipap on and off.  Is very short of breath just speaking with me.   He is from home alone.   Clinical Assessment/Narrative: I spoke with [name].  He is at home alone.  He has a care taker [name] who is with him 3-5 days a week during the day time hours.  He is unable to walk or perform ADLs.   He has two brothers.  One of them has stayed with him in the past, but recently he has not had regular contact with either of them.    I asked about HCPOA.  He has no one officially designated but states that he would like [name], his care taker to be his HCPOA rather than his brothers.  He was very specific about that.  I advised [name] to put that in writing,  and I have requested Chaplain services to help arrange the completion of a HCPOA.    Further [name] and I discussed a palliative vs curative approach to his pulmonary problems.  He supports a palliative approach and would prefer to be treated with medications in the comfort of his own home - avoiding the hospital even if avoiding the hospital meant that he would pass away.  He was very clear in his wishes.  He stated that he is not afraid of death and that he wanted to enjoy the limited time he had left.   I asked permission to speak with his family.  He gave me permission to speak with [name], but not with his brothers.  In speaking with [name] - she states she is willing to serve as his HCPOA and agrees to sign the necessary  paperwork.  However she is concerned about him coming home with hospice as she can not care for him more than 3 - 5 days a week.  She does not live with [name] and comments that she is not his family.  She also is concerned that his brothers and nieces would not help with providing 24/7 care for him.  Clearly, we will need to have continued conversations with [name].  I believe that if he choose comfort measures with no further Bipap support, he would be eligible for residential hospice as he would be likely to pass within 2 weeks.     I have started low dose oral morphine for dyspnea and low dose ativan for anxiety.  Contacts/Participants in Discussion: Patient and PMT PA Primary Decision Maker: Patient HCPOA: Patient would like to designate [name] [phone].   SUMMARY OF RECOMMENDATIONS Patient chooses palliative approach and home asap with hospice services.  Unfortunately we have not determined whether or not his family will support him and provide 24 hr coverage.   Patient is unable to care for himself or walk at this point. Need to revisit code status and disposition if he does not go to [facility]  Code Status/Advance Care Planning: Limited code    Code Status Orders        Start     Ordered  07/14/2015 2239  Limited resuscitation (code)   Continuous    Question Answer Comment  In the event of cardiac or respiratory ARREST: Perform CPR Yes   In the event of cardiac or respiratory ARREST: Perform Intubation/Mechanical Ventilation No   In the event of cardiac or respiratory ARREST: Perform Defibrillation or Cardioversion if indicated Yes   Antiarrhythmic drugs - Any drug used to treat arrhythmias Yes   Vasoactive drug - Any drug used to stabilize blood pressure Yes      07/05/2015 2239      Other Directives:None  Symptom Management:   Morphine for dyspnea  Ativan for anxiety / sleep  Palliative Prophylaxis:   Bowel Regimen, Delirium  Protocol, Frequent Pain Assessment and Frequent assessment for dyspnea.    Psycho-social/Spiritual:  Support System: Poor Desire for further Chaplaincy support:yes Additional Recommendations: Caregiving  Support/Resources  Prognosis: < 2 weeks if he opted for comfort care only (no Bipap)  Discharge Planning: To select services vs Hospice House.  Ideal would be to find support at home so that he can go home with hospice services.   Chief Complaint/ Primary Diagnoses: Present on Admission:  . HCAP (healthcare-associated pneumonia) . Acute respiratory failure with hypoxia ([place]) . DM (diabetes mellitus), type 2, uncontrolled, with renal complications ([place]) . Normocytic anemia . Elevated lactic acid level . Left ventricular diastolic dysfunction, NYHA class 1 . Moderate to severe pulmonary hypertension ([place]) . Leg edema, right . Hypercarbia . AKI (acute kidney injury) ([place]) . CKD (chronic kidney disease) . Uncontrolled type 2 diabetes mellitus with diabetic nephropathy ([place]) . Pulmonary hypertension ([place]) . Type 2 diabetes mellitus with diabetic nephropathy ([place])  I have reviewed the medical record, interviewed the patient and family, and examined the patient. The following aspects are pertinent.  Past Medical History  Diagnosis Date  . Hypertension   . S/P BKA (below knee amputation) (HCC) IN 1998    LEFT/  HAS PROTHESIS  . History of kidney stones   . Peripheral vascular disease ([place]) S/P LEFT BKA 1998  . Glaucoma of both eyes   . Type 2 diabetes mellitus ([place])   . Diabetic retinopathy ([place])   . 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 CKD (chronic kidney disease), stage III     nephrologist--  dr Florene Glen  . At risk for sleep apnea     STOP-BANG= 4    SENT TO PCP 11-22-2013   Social History   Social History  . Marital Status: Single    Spouse Name: N/A  . Number of Children: N/A  . Years of  Education: N/A   Social History Main Topics  . Smoking status: Former Smoker -- 1.00 packs/day for 5 years    Types: Cigarettes    Quit date: 05/29/1990  . Smokeless tobacco: Never Used  . Alcohol Use: No  . Drug Use: No  . Sexual Activity: Not Asked   Other Topics Concern  . None   Social History Narrative   Family History  Problem Relation Age of Onset  . Diabetes Mellitus II Other    Scheduled Meds: . brimonidine  1 drop Both Eyes Q12H   And  . timolol  1 drop Both Eyes Q12H  . Chlorhexidine Gluconate Cloth  6 each Topical Q0600  . enoxaparin (LOVENOX) injection  30 mg Subcutaneous Q24H  . insulin aspart  0-20 Units Subcutaneous 6 times per day  .  insulin aspart  0-5 Units Subcutaneous QHS  . ipratropium-albuterol  3 mL Nebulization QID  . mupirocin ointment  1 application Nasal BID  . piperacillin-tazobactam (ZOSYN)  IV  3.375 g Intravenous Q8H  . sodium chloride flush  3 mL Intravenous Q12H  . vancomycin  1,000 mg Intravenous Q24H   Continuous Infusions: . sodium chloride 1,000 mL (07/12/15 1445)   PRN Meds:.acetaminophen **OR** acetaminophen, albuterol, LORazepam, morphine injection, morphine CONCENTRATE Medications Prior to Admission:  Prior to Admission medications   Medication Sig Start Date End Date Taking? Authorizing Provider  acetaminophen (TYLENOL) 325 MG tablet Take 2 tablets (650 mg total) by mouth every 6 (six) hours as needed for mild pain (or Fever >/= 101). 05/14/15  Yes Robbie Lis, MD  amLODipine (NORVASC) 10 MG tablet Take 1 tablet (10 mg total) by mouth daily. 04/19/15  Yes Velvet Bathe, MD  brimonidine (ALPHAGAN) 0.2 % ophthalmic solution Place 1 drop into the left eye every 12 (twelve) hours.   Yes Historical Provider, MD  brimonidine-timolol (COMBIGAN) 0.2-0.5 % ophthalmic solution Place 1 drop into both eyes every 12 (twelve) hours.   Yes Historical Provider, MD  omeprazole (PRILOSEC) 20 MG capsule Take 20 mg by mouth daily. 03/26/15  Yes  Historical Provider, MD  pioglitazone (ACTOS) 30 MG tablet Take 30 mg by mouth daily.   Yes Historical Provider, MD  Polyethyl Glycol-Propyl Glycol (SYSTANE OP) Apply 1 drop to eye 2 (two) times daily.   Yes Historical Provider, MD  Saxagliptin-Metformin (KOMBIGLYZE XR) 5-500 MG TB24 Take 1 tablet by mouth daily.   Yes Historical Provider, MD  timolol (BETIMOL) 0.5 % ophthalmic solution Place 1 drop into both eyes 2 (two) times daily.   Yes Historical Provider, MD  colchicine 0.6 MG tablet Take 1 tablet (0.6 mg total) by mouth daily. Patient not taking: Reported on 07/09/2015 04/19/15   Velvet Bathe, MD  levalbuterol Penne Lash) 0.63 MG/3ML nebulizer solution Take 3 mLs (0.63 mg total) by nebulization every 6 (six) hours as needed for wheezing or shortness of breath. Patient not taking: Reported on 07/21/2015 05/14/15   Robbie Lis, MD  linagliptin (TRADJENTA) 5 MG TABS tablet Take 1 tablet (5 mg total) by mouth daily. Patient not taking: Reported on 06/30/2015 05/14/15   Robbie Lis, MD   No Known Allergies  Review of Systems:  Complains of dyspnea.  Denies congestion, sore throat, chest pain, abdominal pain, changes in bowel habits, dysuria  Physical Exam  Pleasant elderly male, sitting up in chair, SOB with speech CV S1, S2 Resp:  Very shallow breaths, poor air movement Abdomen:  Soft Extremity:  LLE is a prosthesis, RLE trace edema.  Vital Signs: BP 163/84 mmHg  Pulse 109  Temp(Src) 97.4 F (36.3 C) (Oral)  Resp 20  Ht 6\' 2"  (1.88 m)  Wt 97.705 kg (215 lb 6.4 oz)  BMI 27.64 kg/m2  SpO2 100%  SpO2: SpO2: 100 % O2 Device:SpO2: 100 % O2 Flow Rate: .O2 Flow Rate (L/min): 6 L/min  IO: Intake/output summary:   Intake/Output Summary (Last 24 hours) at 07/12/15 1618 Last data filed at 07/12/15 0842  Gross per 24 hour  Intake   2050 ml  Output    875 ml  Net   1175 ml    LBM: Last BM Date: 07/06/2015 Baseline Weight: Weight: 92.987 kg (205 lb) Most recent weight: Weight: 97.705  kg (215 lb 6.4 oz)      Palliative Assessment/Data:  Flowsheet Rows  Most Recent Value   Intake Tab    Referral Department  Hospitalist   Unit at Time of Referral  Intermediate Care Unit   Palliative Care Primary Diagnosis  Sepsis/Infectious Disease   Date Notified  07/11/15   Palliative Care Type  New Palliative care   Reason for referral  Clarify Goals of Care   Date of Admission  07/12/2015   Date first seen by Palliative Care  07/12/15   # of days Palliative referral response time  1 Day(s)   # of days IP prior to Palliative referral  3   Clinical Assessment    Palliative Performance Scale Score  30%   Dyspnea Max Last 24 Hours  8   Dyspnea Min Last 24 hours  3   Psychosocial & Spiritual Assessment    Palliative Care Outcomes    Patient/Family meeting held?  Yes   Who was at the meeting?  patient.  Then spoke with [name] on the phone.   Palliative Care Outcomes  Clarified goals of care, Counseled regarding hospice      Additional Data Reviewed:  CBC:    Component Value Date/Time   WBC 5.6 07/11/2015 0339   HGB 8.6* 07/11/2015 0339   HCT 27.8* 07/11/2015 0339   HCT 6.6* 04/17/2015 0453   PLT 214 07/11/2015 0339   MCV 82.7 07/11/2015 0339   NEUTROABS 4.8 05/09/2015 1815   LYMPHSABS 0.6* 05/09/2015 1815   MONOABS 0.3 05/09/2015 1815   EOSABS 0.1 05/09/2015 1815   BASOSABS 0.0 05/09/2015 1815   Comprehensive Metabolic Panel:    Component Value Date/Time   NA 142 07/11/2015 0339   K 4.3 07/11/2015 0339   CL 111 07/11/2015 0339   CO2 20* 07/11/2015 0339   BUN 28* 07/11/2015 0339   CREATININE 2.16* 07/11/2015 0339   GLUCOSE 106* 07/11/2015 0339   CALCIUM 8.5* 07/11/2015 0339   AST 13* 07/09/2015 0508   ALT 11* 07/09/2015 0508   ALKPHOS 56 07/09/2015 0508   BILITOT 0.7 07/09/2015 0508   PROT 6.7 07/09/2015 0508   ALBUMIN 2.1* 07/09/2015 0508     Time In: 3:00 Time Out: 4:15 Time Total: 70 min. Greater than 50%  of this time was spent counseling  and coordinating care related to the above assessment and plan.  Signed by: Imogene Burn, PA-C Palliative Medicine Pager: [phone]  07/12/2015, 4:18 PM  Please contact Palliative Medicine Team phone at [phone] for questions and concerns.

## 2015-07-12 NOTE — Care Management Note (Signed)
Case Management Note  Patient Details  Name: Hector Sloan MRN: [id number] Date of Birth: 1941-02-06  Subjective/Objective:    Pt was discharged from SNF where he went for rehab after previous hospitalization, was active with [facility].  Now he is adamant that he is going to discharge back home with home health following. Pt eligible for LTAC, discussed with pt and his brother.  Brother feels transfer is pt's best option due to frequent therapy.  Pt states he will consider same but keeps repeating that he wants to go home                      Expected Discharge Plan:  [place] (LTAC)  Discharge planning Services  CM Consult  Status of Service:  In process, will continue to follow  Girard Cooter, RN 07/12/2015, 1:50 PM

## 2015-07-12 NOTE — Progress Notes (Signed)
Physical Therapy Treatment Patient Details Name: Hector Sloan MRN: [id number] DOB: 1940/06/15 Today's Date: 07/12/2015    History of Present Illness 75 year old male patient with a past medical history of diabetes on insulin, diabetic nephropathy with chronic kidney disease stage IV, normocytic anemia, grade 1 diastolic dysfunction with mild focal basal hypertrophy based on echocardiogram previous admission, preserved LV function, severely elevated pulmonary pressures with mild TR also noted on echo last admission. He was discharged from this facility on 05/14/15 after admission for H. He was sent to a rehabilitation facility and he was recently discharged this past Monday. Apparently throughout the duration of his time at the facility he was requiring nasal cannula oxygen but this was rapidly weaned and he was discharged home on room air. Patient reports that about 3 days prior to discharge from rehabilitation he noticed having increased coughing and increasing shortness of breath and weakness but had not had any fevers. Symptoms persisted since discharge home. He presented to the ER because of the symptoms.     PT Comments    Pt continues to needs increased supplemental oxygen during activity due to desaturation into the 70's. Pt needed 12L 50% venturi mask to maintain O2 Sat above 90% with activity and had audible wheezing with activity. Called Respiratory to assess. Able to return to 6L [place] after activity with SpO2 95% after activity. Pt needs +2 A for all mobility and Max A to don/doff prosthesis and is not safe to return home alone but pt is refusing SNF. At this point, due to desat with activity and high supplemental O2 demands, recommending LTACH to improve activity tolerance and independence with mobility before returning home.    Follow Up Recommendations  Supervision/Assistance - 24 hour;LTACH     Equipment Recommendations  None recommended by PT    Recommendations for Other Services  OT consult     Precautions / Restrictions Precautions Precautions: Fall Required Braces or Orthoses: Other Brace/Splint Other Brace/Splint: L BK prosthesis Restrictions Weight Bearing Restrictions: No    Mobility  Bed Mobility Overal bed mobility: Needs Assistance Bed Mobility: Supine to Sit     Supine to sit: Mod assist;HOB elevated     General bed mobility comments: Pt able to bring his legs off the bed, but needed Mod A to bring his trunk upright.   Transfers Overall transfer level: Needs assistance Equipment used: Rolling walker (2 wheeled) Transfers: Sit to/from [facility] Sit to Stand: Mod assist;+2 physical assistance;From elevated surface Stand pivot transfers: Mod assist;+2 physical assistance;From elevated surface       General transfer comment: Mod A +2 to power up into standing. VC's for sequencing. Pt able to shuffle his feet over to the chair but was very reliant on UE support through the RW and +2 Mod A for balance.   Ambulation/Gait             General Gait Details: pt unable at this time   Stairs            Wheelchair Mobility    Modified Rankin (Stroke Patients Only)       Balance Overall balance assessment: Needs assistance Sitting-balance support: Bilateral upper extremity supported;Feet supported Sitting balance-Leahy Scale: Fair Sitting balance - Comments: Pt able to sit EOB with supervsion.    Standing balance support: Bilateral upper extremity supported Standing balance-Leahy Scale: Poor Standing balance comment: Reliant on UE support and +2 Mod A for balance.  Cognition Arousal/Alertness: Awake/alert Behavior During Therapy: Flat affect Overall Cognitive Status: Within Functional Limits for tasks assessed                      Exercises      General Comments General comments (skin integrity, edema, etc.): Pt pleasant and cooperative with therapy. Pt very fatigued  after PT session.       Pertinent Vitals/Pain Pain Assessment: No/denies pain    Home Living                      Prior Function            PT Goals (current goals can now be found in the care plan section) Acute Rehab PT Goals Patient Stated Goal: to get stronger to be able to go home PT Goal Formulation: With patient Time For Goal Achievement: 07/24/15 Potential to Achieve Goals: Good Progress towards PT goals: Progressing toward goals    Frequency  Min 3X/week    PT Plan Discharge plan needs to be updated    Co-evaluation             End of Session Equipment Utilized During Treatment: Gait belt;Oxygen Activity Tolerance: Treatment limited secondary to medical complications (Comment) (Pt desats with activity. ) Patient left: in chair;with call bell/phone within reach;with chair alarm set     Time: [id number] PT Time Calculation (min) (ACUTE ONLY): 26 min  Charges:  $Therapeutic Activity: 23-37 mins                    G Codes:      Colon Branch, SPT Colon Branch 07/12/2015, 1:00 PM

## 2015-07-12 NOTE — Care Management Important Message (Signed)
Important Message  Patient Details  Name: Hector Sloan MRN: [id number] Date of Birth: 08-Jul-1940   Medicare Important Message Given:  Yes    [name], Kym Groom, RN 07/12/2015, 1:59 PM

## 2015-07-12 NOTE — Progress Notes (Signed)
Chaplain can complete AD paper work/notary Monday morning.

## 2015-07-12 NOTE — Progress Notes (Signed)
ANTIBIOTIC CONSULT NOTE - INITIAL  Pharmacy Consult for Vancomycin Indication: pneumonia  No Known Allergies  Patient Measurements: Height: 6\' 2"  (188 cm) Weight: 215 lb 6.4 oz (97.705 kg) IBW/kg (Calculated) : 82.2 Adjusted Body Weight:    Vital Signs: Temp: 97.9 F (36.6 C) (04/14 1600) Temp Source: Oral (04/14 1600) BP: 175/90 mmHg (04/14 1600) Pulse Rate: 32 (04/14 1600) Intake/Output from previous day: 04/13 0701 - 04/14 0700 In: 2053 [I.V.:1703; IV Piggyback:350] Out: [id number] [Urine:875] Intake/Output from this shift:    Labs:  Recent Labs  07/11/15 0339  WBC 5.6  HGB 8.6*  PLT 214  CREATININE 2.16*   Estimated Creatinine Clearance: 33.8 mL/min (by C-G formula based on Cr of 2.16).  Recent Labs  07/12/15 1959  [place] 26*     Microbiology:   Medical History: Past Medical History  Diagnosis Date  . Hypertension   . S/P BKA (below knee amputation) (HCC) IN 1998    LEFT/  HAS PROTHESIS  . History of kidney stones   . Peripheral vascular disease ([place]) S/P LEFT BKA 1998  . Glaucoma of both eyes   . Type 2 diabetes mellitus ([place])   . Diabetic retinopathy ([place])   . 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 CKD (chronic kidney disease), stage III     nephrologist--  dr Florene Glen  . At risk for sleep apnea     STOP-BANG= 4    SENT TO PCP 11-22-2013   Assessment:  Infectious Disease: Day #5 of abx for sepsis / recurrent HAP. Strep pneumo antigen negative. Legionella negative. Afebrile, WBC wnl. PCT 0.14. CXR- possible para-pneumonic effusion.   Zosyn 4/10 >> Vancomycin  4/10 >> --4/14 VT: 26   Goal of Therapy:  Vancomycin trough level 15-20 mcg/ml  Plan:  Change Vancomycin to 1g IV q 48h, next dose tomorrow.   Tanajah Boulter S. Alford Highland, PharmD, BCPS Clinical Staff Pharmacist Pager [phone]  [place], [place] 07/12/2015,8:52 PM

## 2015-07-12 NOTE — Progress Notes (Signed)
TEAM 1 - Stepdown/ICU TEAM Progress Note  Hector Sloan [id number] DOB: November 28, 1940 DOA: 07/02/2015 PCP: [name], MD  Admit HPI / Brief Narrative: 75 year old BM PMHx DM Type 2 on insulin, DM Nephropathy, CKD stage IV, Normocytic anemia, Chronic Diastolic CHF, Pulmonary Hypertension,   Discharged from this facility on 05/14/15 after admission for H. He was sent to a rehabilitation facility and he was recently discharged this past Monday. Apparently throughout the duration of his time at the facility he was requiring nasal cannula oxygen but this was rapidly weaned and he was discharged home on room air. Patient reports that about 3 days prior to discharge from rehabilitation he noticed having increased coughing and increasing shortness of breath and weakness but had not had any fevers. Symptoms persisted since discharge home. He presented to the ER because of the symptoms. He has not had any abdominal pain, nausea or vomiting. He is been working with therapy at home. He is not having chest pain or palpitations.   HPI/Subjective:  4/14 A/O 4. Patient requests home hospice   Assessment/Plan: Acute respiratory failure with hypoxia And hypercarbia /HCAP/Mesothelioma? -Patient with progressive shortness of breath and upper respiratory infection type symptoms over the past 6-7 days in setting of known recent treatment for HCAP and persistent x-ray changes concerning for possible parapneumonic effusion  -Broad spectrum antibiotics continue Zosyn and vancomycin -Follow up on blood cultures, sputum culture, urinary legionella and strep -Influenza PCR ordered by ER physician -CT of chest; chest findings consistent with malignancy. Patient would need biopsy to confirm see results below  -Patient has clarified he does not wish to be intubated -Respiratory virus panel negative -Influenza panel negative -Biopsy of LN, pleural-based nodules (Mesothelioma)? Patient has spoken  with [facility] M and refuses any further intervention.  -Spoke with patient in general concerning hospice. Have placed consult to Palliative Care will await their recommendation -DuoNeb QID -Patient requests home Hospice , Acute renal failure superimposed on stage 3 chronic kidney disease  -Baseline renal function BUN 35 and creatinine 2.0 none -Normal saline KVO   DM, type 2, uncontrolled, with renal complications -Currently uncontrolled with CBGs greater than 33 -Metformin DC'd  -Given stage IV chronic kidney disease would not resume metformin -4/10 Hemoglobin A1c= 7.4 -Increase Resistant SSI  Leg edema, right -Negative DVT/SVT     Normocytic anemia(Baseline hemoglobin between 7 and 8) -Suspect anemia related to patient's chronic kidney disease -Anemia panel pending -Patient likely is an appropriate candidate for erythropoietin therapy's?   Left ventricular diastolic dysfunction, NYHA class 1/Moderate to severe pulmonary hypertension  -No evidence of acute heart failure based on current symptomatology -It's presented with hypoxemia or completeness of evaluation will check BNP -Strict in and out since admission +3.9 L -Daily weight Filed Weights   07/09/15 0500 07/10/15 0400 07/11/15 0500  Weight: 96.344 kg (212 lb 6.4 oz) 98.703 kg (217 lb 9.6 oz) 97.705 kg (215 lb 6.4 oz)   -Transfuse for hemoglobin<8 -4/11 transfused 1 unit PRBC  Essential hypertension -Metoprolol 25 mg BID   Goals of care -Consulted palliative Care;Patient with poor respiratory status possible mesothelioma; requested [facility] biopsy however patient does not want invasive procedures. Long-term vs short-term goals of care; full DO NOT RESUSCITATE; hospice?  Code Status: DO NOT INTUBATE Family Communication: no family present at time of exam Disposition Plan: Patient requests home Hospice    Consultants: [facility] pending   Procedure/Significant Events: 4/10 CT chest WO contrast;-Persistent extensive  pleural and parenchymal lung disease. Pleural  tumor (mesothelioma)? -Extensive pleural calcifications; previous trauma/ hemorrhage infection, pleurodesis or asbestos related pleural disease?. -Complex fluid collections major fissure and bilateral pleural thickening. -Nodular opacities at both lung bases could reflect rounded atelectasis or tumor. -Extensive lung disease.-Stable borderline enlarged mediastinal lymph nodes 411 right lower extremity Doppler; negative DVT/SVT   Culture 4/10 MRSA by PCR positive 4/10 influenza A/B/H1N1 negative 4/10 HIV negative 4/11Right arm/hand NGTD 4/11 respiratory virus panel negative    Antibiotics: Zosyn 4/10 >> Vancomycin 4/10 >>   DVT prophylaxis: Lovenox   Devices    LINES / TUBES:      Continuous Infusions: . sodium chloride 50 mL/hr at 07/10/15 1924    Objective: VITAL SIGNS: Temp: 98.3 F (36.8 C) (04/14 0425) Temp Source: Axillary (04/14 0425) BP: 166/92 mmHg (04/14 0425) Pulse Rate: 95 (04/14 0425) SPO2; FIO2:   Intake/Output Summary (Last 24 hours) at 07/12/15 0813 Last data filed at 07/12/15 0700  Gross per 24 hour  Intake   2053 ml  Output    875 ml  Net   1178 ml     Exam: General:4/11 A/O 4, positive acute respiratory distress, Eyes: Negative headache, negative scleral hemorrhage ENT: Negative Runny nose, negative gingival bleeding, Neck:  Negative scars, masses, torticollis, lymphadenopathy, JVD Lungs: diffuse poor air movement, positive expiratory wheezes negative crackles Cardiovascular: Tachycardic, Regular rhythm without murmur gallop or rub normal S1 and S2 Abdomen:negative abdominal pain, nondistended, positive soft, bowel sounds, no rebound, no ascites, no appreciable mass Extremities: No significant cyanosis, clubbing, or edema bilateral lower extremities Psychiatric:  Negative depression, negative anxiety, negative fatigue, negative mania  Neurologic:  Cranial nerves II through XII intact,  tongue/uvula midline, all extremities muscle strength 5/5, sensation intact throughout, negative dysarthria, negative expressive aphasia, negative receptive aphasia.   Data Reviewed: Basic Metabolic Panel:  Recent Labs Lab 07/24/2015 1155 07/18/2015 1227 07/09/15 0508 07/11/15 0339  NA 138 137 142 142  K 4.3 5.4* 4.5 4.3  CL 104 107 111 111  CO2 19*  --  22 20*  GLUCOSE 331* 333* 105* 106*  BUN 39* 56* 34* 28*  CREATININE 2.87* 2.60* 2.53* 2.16*  CALCIUM 8.8*  --  8.4* 8.5*   Liver Function Tests:  Recent Labs Lab 07/09/15 0508  AST 13*  ALT 11*  ALKPHOS 56  BILITOT 0.7  PROT 6.7  ALBUMIN 2.1*   No results for input(s): LIPASE, AMYLASE in the last 168 hours. No results for input(s): AMMONIA in the last 168 hours. CBC:  Recent Labs Lab 07/07/2015 1155 07/26/2015 1227 07/09/15 0508 07/11/15 0339  WBC 6.2  --  5.2 5.6  HGB 8.8* 9.9* 7.1* 8.6*  HCT 27.4* 29.0* 22.1* 27.8*  MCV 81.8  --  81.3 82.7  PLT 295  --  215 214   Cardiac Enzymes: No results for input(s): CKTOTAL, CKMB, CKMBINDEX, TROPONINI in the last 168 hours. BNP (last 3 results)  Recent Labs  05/09/15 1815 05/11/15 0203 07/14/2015 1714  BNP 132.0* 95.2 312.0*    ProBNP (last 3 results) No results for input(s): PROBNP in the last 8760 hours.  CBG:  Recent Labs Lab 07/10/15 2153 07/11/15 0853 07/11/15 1241 07/11/15 1658 07/11/15 2143  GLUCAP 157* 92 157* 281* 179*    Recent Results (from the past 240 hour(s))  MRSA PCR Screening     Status: Abnormal   Collection Time: 07/03/2015  8:55 PM  Result Value Ref Range Status   MRSA by PCR POSITIVE (A) NEGATIVE Final    Comment:  The GeneXpert MRSA Assay (FDA approved for NASAL specimens only), is one component of a comprehensive MRSA colonization surveillance program. It is not intended to diagnose MRSA infection nor to guide or monitor treatment for MRSA infections. RESULT CALLED TO, READ BACK BY AND VERIFIED WITH: L BUNN,RN@[id number]   07/28/2015 MKELLY   Blood culture (routine x 2)     Status: None (Preliminary result)   Collection Time: 07/09/15 12:05 AM  Result Value Ref Range Status   Specimen Description BLOOD RIGHT ARM  Final   Special Requests BOTTLES DRAWN AEROBIC AND ANAEROBIC 5CC  Final   Culture NO GROWTH 2 DAYS  Final   Report Status PENDING  Incomplete  Blood culture (routine x 2)     Status: None (Preliminary result)   Collection Time: 07/09/15 12:34 AM  Result Value Ref Range Status   Specimen Description BLOOD RIGHT HAND  Final   Special Requests IN PEDIATRIC BOTTLE 3CC  Final   Culture NO GROWTH 2 DAYS  Final   Report Status PENDING  Incomplete  Respiratory virus panel     Status: None   Collection Time: 07/09/15  9:46 AM  Result Value Ref Range Status   Respiratory Syncytial Virus A Negative Negative Final   Respiratory Syncytial Virus B Negative Negative Final   Influenza A Negative Negative Final   Influenza B Negative Negative Final   Parainfluenza 1 Negative Negative Final   Parainfluenza 2 Negative Negative Final   Parainfluenza 3 Negative Negative Final   Metapneumovirus Negative Negative Final   Rhinovirus Negative Negative Final   Adenovirus Negative Negative Final    Comment: (NOTE) Performed At: [facility] [place], [place] [id number] Lindon Romp MD [id number]      Studies:  Recent x-ray studies have been reviewed in detail by the Attending Physician  Scheduled Meds:  Scheduled Meds: . brimonidine  1 drop Both Eyes Q12H   And  . timolol  1 drop Both Eyes Q12H  . Chlorhexidine Gluconate Cloth  6 each Topical Q0600  . enoxaparin (LOVENOX) injection  30 mg Subcutaneous Q24H  . insulin aspart  0-15 Units Subcutaneous TID WC  . insulin aspart  0-5 Units Subcutaneous QHS  . ipratropium-albuterol  3 mL Nebulization QID  . mupirocin ointment  1 application Nasal BID  . piperacillin-tazobactam (ZOSYN)  IV  3.375 g Intravenous Q8H  . sodium  chloride flush  3 mL Intravenous Q12H  . vancomycin  1,000 mg Intravenous Q24H    Time spent on care of this patient: 40 mins   Shannen Vernon, Geraldo Docker , MD  [facility] Office  [phone] Pager - [phone]  On-Call/Text Page:      [email or web address]      password [email or web address]  If 7PM-7AM, please contact night-coverage [email or web address] Password [email or web address] 07/12/2015, 8:13 AM   LOS: 4 days   Care during the described time interval was provided by me .  I have reviewed this patient's available data, including medical history, events of note, physical examination, and all test results as part of my evaluation. I have personally reviewed and interpreted all radiology studies.   Dia Crawford, MD [phone] Pager

## 2015-07-13 ENCOUNTER — Inpatient Hospital Stay (HOSPITAL_COMMUNITY): Payer: Medicare Other

## 2015-07-13 DIAGNOSIS — E1165 Type 2 diabetes mellitus with hyperglycemia: Secondary | ICD-10-CM | POA: Diagnosis present

## 2015-07-13 DIAGNOSIS — IMO0002 Reserved for concepts with insufficient information to code with codable children: Secondary | ICD-10-CM | POA: Diagnosis present

## 2015-07-13 DIAGNOSIS — E118 Type 2 diabetes mellitus with unspecified complications: Secondary | ICD-10-CM

## 2015-07-13 DIAGNOSIS — I10 Essential (primary) hypertension: Secondary | ICD-10-CM | POA: Diagnosis present

## 2015-07-13 DIAGNOSIS — R600 Localized edema: Secondary | ICD-10-CM

## 2015-07-13 LAB — CBC WITH DIFFERENTIAL/PLATELET
Basophils Absolute: 0 10*3/uL (ref 0.0–0.1)
Basophils Relative: 0 %
EOS ABS: 0.2 10*3/uL (ref 0.0–0.7)
Eosinophils Relative: 3 %
HEMATOCRIT: 28.2 % — AB (ref 39.0–52.0)
HEMOGLOBIN: 8.6 g/dL — AB (ref 13.0–17.0)
LYMPHS ABS: 0.5 10*3/uL — AB (ref 0.7–4.0)
LYMPHS PCT: 8 %
MCH: 25.7 pg — AB (ref 26.0–34.0)
MCHC: 30.5 g/dL (ref 30.0–36.0)
MCV: 84.4 fL (ref 78.0–100.0)
MONOS PCT: 8 %
Monocytes Absolute: 0.5 10*3/uL (ref 0.1–1.0)
NEUTROS ABS: 5.2 10*3/uL (ref 1.7–7.7)
NEUTROS PCT: 81 %
Platelets: 221 10*3/uL (ref 150–400)
RBC: 3.34 MIL/uL — AB (ref 4.22–5.81)
RDW: 19.4 % — ABNORMAL HIGH (ref 11.5–15.5)
WBC: 6.4 10*3/uL (ref 4.0–10.5)

## 2015-07-13 LAB — BASIC METABOLIC PANEL
ANION GAP: 7 (ref 5–15)
BUN: 28 mg/dL — ABNORMAL HIGH (ref 6–20)
CHLORIDE: 114 mmol/L — AB (ref 101–111)
CO2: 24 mmol/L (ref 22–32)
Calcium: 8.6 mg/dL — ABNORMAL LOW (ref 8.9–10.3)
Creatinine, Ser: 2.03 mg/dL — ABNORMAL HIGH (ref 0.61–1.24)
GFR calc non Af Amer: 30 mL/min — ABNORMAL LOW (ref >60)
GFR, EST AFRICAN AMERICAN: 35 mL/min — AB (ref >60)
Glucose, Bld: 136 mg/dL — ABNORMAL HIGH (ref 65–99)
POTASSIUM: 4.9 mmol/L (ref 3.5–5.1)
SODIUM: 145 mmol/L (ref 135–145)

## 2015-07-13 LAB — BLOOD GAS, ARTERIAL
ACID-BASE DEFICIT: 0.6 mmol/L (ref 0.0–2.0)
Bicarbonate: 25.5 mEq/L — ABNORMAL HIGH (ref 20.0–24.0)
DELIVERY SYSTEMS: POSITIVE
Drawn by: 441381
EXPIRATORY PAP: 6
FIO2: 0.45
INSPIRATORY PAP: 16
LHR: 10 {breaths}/min
O2 Saturation: 81.2 %
PH ART: 7.278 — AB (ref 7.350–7.450)
Patient temperature: 97.1
TCO2: 27.3 mmol/L (ref 0–100)
pCO2 arterial: 55.6 mmHg — ABNORMAL HIGH (ref 35.0–45.0)
pO2, Arterial: 47.9 mmHg — ABNORMAL LOW (ref 80.0–100.0)

## 2015-07-13 LAB — GLUCOSE, CAPILLARY
GLUCOSE-CAPILLARY: 131 mg/dL — AB (ref 65–99)
GLUCOSE-CAPILLARY: 85 mg/dL (ref 65–99)
Glucose-Capillary: 117 mg/dL — ABNORMAL HIGH (ref 65–99)
Glucose-Capillary: 162 mg/dL — ABNORMAL HIGH (ref 65–99)

## 2015-07-13 LAB — MAGNESIUM: MAGNESIUM: 1.9 mg/dL (ref 1.7–2.4)

## 2015-07-13 MED ORDER — INSULIN ASPART 100 UNIT/ML ~~LOC~~ SOLN
0.0000 [IU] | Freq: Three times a day (TID) | SUBCUTANEOUS | Status: DC
  Administered 2015-07-13: 3 [IU] via SUBCUTANEOUS
  Administered 2015-07-13 – 2015-07-14 (×3): 4 [IU] via SUBCUTANEOUS
  Administered 2015-07-15 – 2015-07-16 (×2): 3 [IU] via SUBCUTANEOUS
  Administered 2015-07-16 – 2015-07-17 (×2): 4 [IU] via SUBCUTANEOUS

## 2015-07-13 MED ORDER — ENOXAPARIN SODIUM 40 MG/0.4ML ~~LOC~~ SOLN
40.0000 mg | SUBCUTANEOUS | Status: DC
  Administered 2015-07-13 – 2015-07-16 (×4): 40 mg via SUBCUTANEOUS
  Filled 2015-07-13 (×4): qty 0.4

## 2015-07-13 NOTE — Progress Notes (Signed)
Trying pt off bipap placed on 5L

## 2015-07-13 NOTE — Plan of Care (Signed)
Problem: Education: Goal: Knowledge of [facility] Education information/materials will improve Outcome: Progressing Discussed the use of bipap and oxygen throughout the shift.

## 2015-07-13 NOTE — Progress Notes (Signed)
[place] TEAM 1 - Stepdown/ICU TEAM Progress Note  Rodman Gronlund [id number] DOB: 1940/05/30 DOA: 07/15/2015 PCP: [name], MD  Admit HPI / Brief Narrative: 75 year old BM PMHx DM Type 2 on insulin, DM Nephropathy, CKD stage IV, Normocytic anemia, Chronic Diastolic CHF, Pulmonary Hypertension,   Discharged from this facility on 05/14/15 after admission for H. He was sent to a rehabilitation facility and he was recently discharged this past Monday. Apparently throughout the duration of his time at the facility he was requiring nasal cannula oxygen but this was rapidly weaned and he was discharged home on room air. Patient reports that about 3 days prior to discharge from rehabilitation he noticed having increased coughing and increasing shortness of breath and weakness but had not had any fevers. Symptoms persisted since discharge home. He presented to the ER because of the symptoms. He has not had any abdominal pain, nausea or vomiting. He is been working with therapy at home. He is not having chest pain or palpitations.   HPI/Subjective:  4/14 A/O 4. Patient requests home hospice   Assessment/Plan: Acute respiratory failure with hypoxia And hypercarbia /HCAP/Mesothelioma? -Patient with progressive shortness of breath and upper respiratory infection type symptoms over the past 6-7 days in setting of known recent treatment for HCAP and persistent x-ray changes concerning for possible parapneumonic effusion  -Broad spectrum antibiotics continue Zosyn and vancomycin -Follow up on blood cultures, sputum culture, urinary legionella and strep -Influenza PCR ordered by ER physician -CT of chest; chest findings consistent with malignancy. Patient would need biopsy to confirm see results below  -Patient has clarified he does not wish to be intubated -Respiratory virus panel negative -Influenza panel negative -Biopsy of LN, pleural-based nodules (Mesothelioma)? Patient has spoken  with [facility] and refuses any further intervention.  -Spoke with patient in general concerning hospice. Have placed consult to Palliative Care will await their recommendation -DuoNeb QID -Patient requests home Hospice , Acute renal failure superimposed on stage 3 chronic kidney disease  -Baseline renal function BUN 35 and creatinine 2.0 none -Normal saline KVO   DM, type 2, uncontrolled, with renal complications -Currently uncontrolled with CBGs greater than 33 -Metformin DC'd  -Given stage IV chronic kidney disease would not resume metformin -4/10 Hemoglobin A1c= 7.4 -Increase Resistant SSI  Leg edema, right -Negative DVT/SVT     Normocytic anemia(Baseline hemoglobin between 7 and 8) -Suspect anemia related to patient's chronic kidney disease -Anemia panel pending   Left ventricular diastolic dysfunction, NYHA class 1/Moderate to severe pulmonary hypertension  -No evidence of acute heart failure based on current symptomatology -It's presented with hypoxemia or completeness of evaluation will check BNP -Strict in and out since admission +3.9 L -Daily weight Filed Weights   07/10/15 0400 07/11/15 0500 07/13/15 0500  Weight: 98.703 kg (217 lb 9.6 oz) 97.705 kg (215 lb 6.4 oz) 102.513 kg (226 lb)   -Transfuse for hemoglobin<8 -4/11 transfused 1 unit PRBC  Essential hypertension -Metoprolol 25 mg BID   Goals of care -Consulted palliative Care;Patient with poor respiratory status possible mesothelioma; requested [facility] M biopsy however patient does not want invasive procedures. Long-term vs short-term goals of care; full DO NOT RESUSCITATE; hospice?  Code Status: DO NOT INTUBATE Family Communication: no family present at time of exam Disposition Plan: Patient requests home Hospice    Consultants: [facility] M pending   Procedure/Significant Events: 4/10 CT chest WO contrast;-Persistent extensive pleural and parenchymal lung disease. Pleural tumor (mesothelioma)? -Extensive  pleural calcifications; previous trauma/ hemorrhage infection, pleurodesis or  asbestos related pleural disease?. -Complex fluid collections major fissure and bilateral pleural thickening. -Nodular opacities at both lung bases could reflect rounded atelectasis or tumor. -Extensive lung disease.-Stable borderline enlarged mediastinal lymph nodes 411 right lower extremity Doppler; negative DVT/SVT   Culture 4/10 MRSA by PCR positive 4/10 influenza A/B/H1N1 negative 4/10 HIV negative 4/11Right arm/hand NGTD 4/11 respiratory virus panel negative    Antibiotics: Zosyn 4/10 >> Vancomycin 4/10 >>   DVT prophylaxis: Lovenox   Devices    LINES / TUBES:      Continuous Infusions: . sodium chloride 10 mL/hr at 07/12/15 2025    Objective: VITAL SIGNS: Temp: 97.7 F (36.5 C) (04/15 0500) Temp Source: Axillary (04/15 0500) BP: 145/77 mmHg (04/15 0600) Pulse Rate: 104 (04/15 0804) SPO2; FIO2:   Intake/Output Summary (Last 24 hours) at 07/13/15 [id number] Last data filed at 07/13/15 [id number]  Gross per 24 hour  Intake  890.5 ml  Output    600 ml  Net  290.5 ml     Exam: General: A/O 4, positive acute respiratory distress, Eyes: Negative headache, negative scleral hemorrhage ENT: Negative Runny nose, negative gingival bleeding, Neck:  Negative scars, masses, torticollis, lymphadenopathy, JVD Lungs: diffuse poor air movement, positive expiratory wheezes negative crackles Cardiovascular: Tachycardic, Regular rhythm without murmur gallop or rub normal S1 and S2 Abdomen:negative abdominal pain, nondistended, positive soft, bowel sounds, no rebound, no ascites, no appreciable mass Extremities: No significant cyanosis, clubbing, or edema bilateral lower extremities Psychiatric:  Negative depression, negative anxiety, negative fatigue, negative mania  Neurologic:  Cranial nerves II through XII intact, tongue/uvula midline, all extremities muscle strength 5/5, sensation intact  throughout, negative dysarthria, negative expressive aphasia, negative receptive aphasia.   Data Reviewed: Basic Metabolic Panel:  Recent Labs Lab 07/13/2015 1155 07/22/2015 1227 07/09/15 0508 07/11/15 0339 07/13/15 0514  NA 138 137 142 142 145  K 4.3 5.4* 4.5 4.3 4.9  CL 104 107 111 111 114*  CO2 19*  --  22 20* 24  GLUCOSE 331* 333* 105* 106* 136*  BUN 39* 56* 34* 28* 28*  CREATININE 2.87* 2.60* 2.53* 2.16* 2.03*  CALCIUM 8.8*  --  8.4* 8.5* 8.6*  MG  --   --   --   --  1.9   Liver Function Tests:  Recent Labs Lab 07/09/15 0508  AST 13*  ALT 11*  ALKPHOS 56  BILITOT 0.7  PROT 6.7  ALBUMIN 2.1*   No results for input(s): LIPASE, AMYLASE in the last 168 hours. No results for input(s): AMMONIA in the last 168 hours. CBC:  Recent Labs Lab 07/09/2015 1155 07/10/2015 1227 07/09/15 0508 07/11/15 0339 07/13/15 0514  WBC 6.2  --  5.2 5.6 6.4  NEUTROABS  --   --   --   --  5.2  HGB 8.8* 9.9* 7.1* 8.6* 8.6*  HCT 27.4* 29.0* 22.1* 27.8* 28.2*  MCV 81.8  --  81.3 82.7 84.4  PLT 295  --  215 214 221   Cardiac Enzymes: No results for input(s): CKTOTAL, CKMB, CKMBINDEX, TROPONINI in the last 168 hours. BNP (last 3 results)  Recent Labs  05/09/15 1815 05/11/15 0203 07/04/2015 1714  BNP 132.0* 95.2 312.0*    ProBNP (last 3 results) No results for input(s): PROBNP in the last 8760 hours.  CBG:  Recent Labs Lab 07/11/15 2143 07/12/15 0840 07/12/15 1313 07/12/15 1641 07/12/15 2131  GLUCAP 179* 106* 209* 291* 173*    Recent Results (from the past 240 hour(s))  MRSA PCR Screening  Status: Abnormal   Collection Time: 07/03/2015  8:55 PM  Result Value Ref Range Status   MRSA by PCR POSITIVE (A) NEGATIVE Final    Comment:        The GeneXpert MRSA Assay (FDA approved for NASAL specimens only), is one component of a comprehensive MRSA colonization surveillance program. It is not intended to diagnose MRSA infection nor to guide or monitor treatment for MRSA  infections. RESULT CALLED TO, READ BACK BY AND VERIFIED WITH: L BUNN,RN@[id number]  07/06/2015 MKELLY   Blood culture (routine x 2)     Status: None (Preliminary result)   Collection Time: 07/09/15 12:05 AM  Result Value Ref Range Status   Specimen Description BLOOD RIGHT ARM  Final   Special Requests BOTTLES DRAWN AEROBIC AND ANAEROBIC 5CC  Final   Culture NO GROWTH 3 DAYS  Final   Report Status PENDING  Incomplete  Blood culture (routine x 2)     Status: None (Preliminary result)   Collection Time: 07/09/15 12:34 AM  Result Value Ref Range Status   Specimen Description BLOOD RIGHT HAND  Final   Special Requests IN PEDIATRIC BOTTLE 3CC  Final   Culture NO GROWTH 3 DAYS  Final   Report Status PENDING  Incomplete  Respiratory virus panel     Status: None   Collection Time: 07/09/15  9:46 AM  Result Value Ref Range Status   Respiratory Syncytial Virus A Negative Negative Final   Respiratory Syncytial Virus B Negative Negative Final   Influenza A Negative Negative Final   Influenza B Negative Negative Final   Parainfluenza 1 Negative Negative Final   Parainfluenza 2 Negative Negative Final   Parainfluenza 3 Negative Negative Final   Metapneumovirus Negative Negative Final   Rhinovirus Negative Negative Final   Adenovirus Negative Negative Final    Comment: (NOTE) Performed At: [facility] [place], [place] [id number] Lindon Romp MD [id number]      Studies:  Recent x-ray studies have been reviewed in detail by the Attending Physician  Scheduled Meds:  Scheduled Meds: . brimonidine  1 drop Both Eyes Q12H   And  . timolol  1 drop Both Eyes Q12H  . Chlorhexidine Gluconate Cloth  6 each Topical Q0600  . enoxaparin (LOVENOX) injection  30 mg Subcutaneous Q24H  . insulin aspart  0-20 Units Subcutaneous TID WC  . insulin aspart  0-5 Units Subcutaneous QHS  . ipratropium-albuterol  3 mL Nebulization QID  . metoprolol tartrate  25 mg Oral BID  .  mupirocin ointment  1 application Nasal BID  . piperacillin-tazobactam (ZOSYN)  IV  3.375 g Intravenous Q8H  . sodium chloride flush  3 mL Intravenous Q12H  . vancomycin  1,000 mg Intravenous Q48H    Time spent on care of this patient: 40 mins   Giovani Neumeister, Geraldo Docker , MD  [facility] Office  [phone] Pager - [phone]  On-Call/Text Page:      [email or web address]      password [email or web address]  If 7PM-7AM, please contact night-coverage [email or web address] Password [email or web address] 07/13/2015, 8:12 AM   LOS: 5 days   Care during the described time interval was provided by me .  I have reviewed this patient's available data, including medical history, events of note, physical examination, and all test results as part of my evaluation. I have personally reviewed and interpreted all radiology studies.   Dia Crawford, MD [phone] Pager

## 2015-07-13 NOTE — Progress Notes (Signed)
Daily Progress Note   Patient Name: Hector Sloan       Date: 07/13/2015 DOB: 1940/06/19  Age: 75 y.o. MRN#: [id number] Attending Physician: Allie Bossier, MD Primary Care Physician: [name], MD Admit Date: 07/03/2015  Reason for Consultation/Follow-up: Establishing goals of care, Non pain symptom management and Psychosocial/spiritual support  Subjective: Patient continues to utilize BiPAP, more so at night. He does share with me that he is afraid to sleep without it for fear of dying in his sleep. This is confirmed by nursing that he is asking for the BiPAP to be placed. He is still receiving when necessary morphine, but inconsistently. He is unable to articulate whether this is helped him to breathe or not. In speaking with the patient, after reviewing his chart, it is noted that he wishes for his friend [name] to be his healthcare power of attorney. There is also documentation that patient has biological children with whom he has been estranged. Patient is telling me that he wishes to go home with a comfort care approach. I talked to him about my concerns for him in that he lives alone. Patient has not been able to walk in the hospital, has needed help with meals, meds, and overall exhibits poor insight into his current clinical condition and associated disability. He significantly short of breath at rest with 3-5 word dyspnea. Additionally his answers are inconsistent and he exhibits short-term memory deficits  Interval Events: Unable to wean off BiPAP Length of Stay: 5 days  Current Medications: Scheduled Meds:  . brimonidine  1 drop Both Eyes Q12H   And  . timolol  1 drop Both Eyes Q12H  . Chlorhexidine Gluconate Cloth  6 each Topical Q0600  . enoxaparin  (LOVENOX) injection  40 mg Subcutaneous Q24H  . insulin aspart  0-20 Units Subcutaneous TID WC  . insulin aspart  0-5 Units Subcutaneous QHS  . ipratropium-albuterol  3 mL Nebulization QID  . metoprolol tartrate  25 mg Oral BID  . piperacillin-tazobactam (ZOSYN)  IV  3.375 g Intravenous Q8H  . sodium chloride flush  3 mL Intravenous Q12H  . vancomycin  1,000 mg Intravenous Q48H    Continuous Infusions: . sodium chloride 10 mL/hr at 07/12/15 2025    PRN Meds: acetaminophen **OR** acetaminophen, albuterol, LORazepam, morphine injection, morphine CONCENTRATE  Physical Exam: Physical Exam  Constitutional: He appears well-nourished.  HENT:  Head: Normocephalic and atraumatic.  Pulmonary/Chest:  Increased work of breathing at rest. 3-5 word dyspnea  Neurological: He is alert.  Oriented to self. Shows limited insight to his self-care abilities, significant short term memory deficits   Skin: Skin is warm and dry.                Vital Signs: BP 152/92 mmHg  Pulse 99  Temp(Src) 97.7 F (36.5 C) (Oral)  Resp 38  Ht 6\' 2"  (1.88 m)  Wt 102.513 kg (226 lb)  BMI 29.00 kg/m2  SpO2 100% SpO2: SpO2: 100 % O2 Device: O2 Device: Nasal Cannula O2 Flow Rate: O2 Flow Rate (L/min): 6 L/min  Intake/output summary:  Intake/Output Summary (Last 24 hours) at 07/13/15 1731 Last data filed at 07/13/15 1549  Gross per 24 hour  Intake  390.5 ml  Output    830 ml  Net -439.5 ml   LBM: Last BM Date:  (unknown) Baseline Weight: Weight: 92.987 kg (205 lb) Most recent weight: Weight: 102.513 kg (226 lb)       Palliative Assessment/Data: Flowsheet Rows        Most Recent Value   Intake Tab    Referral Department  Hospitalist   Unit at Time of Referral  Intermediate Care Unit   Palliative Care Primary Diagnosis  Sepsis/Infectious Disease   Date Notified  07/11/15   Palliative Care Type  New Palliative care   Reason for referral  Clarify Goals of Care   Date of Admission  07/22/2015   Date  first seen by Palliative Care  07/12/15   # of days Palliative referral response time  1 Day(s)   # of days IP prior to Palliative referral  3   Clinical Assessment    Palliative Performance Scale Score  30%   Dyspnea Max Last 24 Hours  8   Dyspnea Min Last 24 hours  3   Psychosocial & Spiritual Assessment    Palliative Care Outcomes    Patient/Family meeting held?  Yes   Who was at the meeting?  patient.  Then spoke with [name] on the phone.   Palliative Care Outcomes  Clarified goals of care, Counseled regarding hospice      Additional Data Reviewed: CBC    Component Value Date/Time   WBC 6.4 07/13/2015 0514   RBC 3.34* 07/13/2015 0514   RBC 3.26* 07/26/2015 1729   HGB 8.6* 07/13/2015 0514   HCT 28.2* 07/13/2015 0514   HCT 6.6* 04/17/2015 0453   PLT 221 07/13/2015 0514   MCV 84.4 07/13/2015 0514   MCH 25.7* 07/13/2015 0514   MCHC 30.5 07/13/2015 0514   RDW 19.4* 07/13/2015 0514   LYMPHSABS 0.5* 07/13/2015 0514   MONOABS 0.5 07/13/2015 0514   EOSABS 0.2 07/13/2015 0514   BASOSABS 0.0 07/13/2015 0514    CMP     Component Value Date/Time   NA 145 07/13/2015 0514   K 4.9 07/13/2015 0514   CL 114* 07/13/2015 0514   CO2 24 07/13/2015 0514   GLUCOSE 136* 07/13/2015 0514   BUN 28* 07/13/2015 0514   CREATININE 2.03* 07/13/2015 0514   CALCIUM 8.6* 07/13/2015 0514   PROT 6.7 07/09/2015 0508   ALBUMIN 2.1* 07/09/2015 0508   AST 13* 07/09/2015 0508   ALT 11* 07/09/2015 0508   ALKPHOS 56 07/09/2015 0508   BILITOT 0.7 07/09/2015 0508   GFRNONAA 30* 07/13/2015 0514   GFRAA 35* 07/13/2015  [id number]       Problem List:  Patient Active Problem List   Diagnosis Date Noted  . Essential hypertension   . Type 2 diabetes mellitus with diabetic nephropathy ([place])   . Dyspnea   . Palliative care encounter   . Goals of care, counseling/discussion   . Encounter for hospice care discussion   . Uncontrolled type 2 diabetes mellitus with diabetic nephropathy ([place])   . Pulmonary  hypertension ([place])   . Acute respiratory failure with hypoxia ([place]) 07/16/2015  . Elevated lactic acid level 07/07/2015  . Left ventricular diastolic dysfunction, NYHA class 1 07/23/2015  . Moderate to severe pulmonary hypertension ([place]) 07/10/2015  . Leg edema, right 07/09/2015  . Hypercarbia 07/27/2015  . AKI (acute kidney injury) ([place]) 07/23/2015  . CKD (chronic kidney disease) 07/15/2015  . HCAP (healthcare-associated pneumonia) 05/09/2015  . DM (diabetes mellitus), type 2, uncontrolled, with renal complications ([place]) [id number]  . Acute renal failure superimposed on stage 3 chronic kidney disease ([place]) 04/16/2015  . Normocytic anemia 04/16/2015     Palliative Care Assessment & Plan    1.Code Status:  Limited code    Code Status Orders        Start     Ordered   07/16/2015 2239  Limited resuscitation (code)   Continuous    Question Answer Comment  In the event of cardiac or respiratory ARREST: Perform CPR Yes   In the event of cardiac or respiratory ARREST: Perform Intubation/Mechanical Ventilation No   In the event of cardiac or respiratory ARREST: Perform Defibrillation or Cardioversion if indicated Yes   Antiarrhythmic drugs - Any drug used to treat arrhythmias Yes   Vasoactive drug - Any drug used to stabilize blood pressure Yes      07/15/2015 2239    Code Status History    Date Active Date Inactive Code Status Order ID Comments User Context   07/14/2015  8:30 PM 07/15/2015 10:39 PM Full Code [id number]  Samella Parr, NP ED   07/04/2015  3:18 PM 07/21/2015  8:30 PM Partial Code [id number]  Samella Parr, NP ED   05/09/2015 10:33 PM 05/14/2015  5:49 PM Full Code [id number]  Rise Patience, MD Inpatient   04/16/2015  8:47 PM 04/19/2015  4:37 PM Full Code [id number]  Vianne Bulls, MD ED       2. Goals of Care/Additional Recommendations:  Would recommend a formal evaluation for capacity before pursuing making Mrs. [name] his healthcare power of  attorney.  It is my opinion that he is not safe to live alone with someone checking in on him and would require 24-hour care given his significant dyspnea and associated disability  His continued request for BiPAP is making it difficult to transition him either to a skilled nursing facility with hospice support or to a hospice inpatient facility. He is declining very quickly, becoming more confused and may meet inpatient criteria because of the extent of his pulmonary disease and now questionable underlying mesothelioma  Opioids would be our best option to wean patient off of BiPAP. Some of his requests for BiPAP is based on his fear that he will die in his sleep. He tells me that when he wears the BiPAP at bedtime he is able to go to sleep  Limitations on Scope of Treatment: No Artificial Feeding, No Hemodialysis, No Surgical Procedures and No Tracheostomy  Desire for further Chaplaincy support:no  Psycho-social Needs: Formal Capacity Evaluation  3. Symptom Management:  1. Pain: Patient is currently denying pain. Continue with morphine IV or by mouth when necessary      2. Dyspnea: Not improving and still utilizing BiPAP intermittently. If we can instill some security in patient with usage of opioids we may be able to wean him further off of BiPAP. PRN dosing of MS04, thus drug calculation, has been very spotty in terms of his usage, but we may have to consider low-dose extended release morphine to bridge this gap anyway  4. Palliative Prophylaxis:   Aspiration, Delirium Protocol, Frequent Pain Assessment, Oral Care and Turn Reposition  5. Prognosis: Variable in the setting of progressive shortness of breath, hypercarbic respiratory failure and now pleural-based nodules, questionable mesothelioma. He certainly qualifies for hospice in the home with a probable prognosis of 6 months or less but he is at high risk for cardiopulmonary failure and once BiPAP has been discontinued may have a  prognosis of just weeks   6. Discharge Planning:  Skilled nursing facility is likely the best alternative for patient although that is not his wish. His pulmonary status is so poor without significant support in the home which he does not have, I  feel that this would be a very unsafe plan. He is declining very quickly in terms of mental status as well as pulmonary status and if things were to continue to go the  way they are now he may even meet inpatient criteria. BiPAP is an obstacle to disposition in either setting   Care plan was discussed with Dr. Sherral Hammers  Thank you for allowing the Palliative Medicine Team to assist in the care of this patient.   Time In: 1700 Time Out: 1740 Total Time 40 min Prolonged Time Billed  no         Dory Horn, NP  07/13/2015, 5:31 PM  Please contact Palliative Medicine Team phone at [phone] for questions and concerns.

## 2015-07-13 NOTE — Progress Notes (Signed)
[facility] TEAM 1 - Stepdown/ICU TEAM Progress Note  Hector Sloan [id number] DOB: August 30, 1940 DOA: 07/01/2015 PCP: [name], MD  Admit HPI / Brief Narrative: 75 year old BM PMHx DM Type 2 on insulin, DM Nephropathy, CKD stage IV, Normocytic anemia, Chronic Diastolic CHF, Pulmonary Hypertension,   Discharged from this facility on 05/14/15 after admission for H. He was sent to a rehabilitation facility and he was recently discharged this past Monday. Apparently throughout the duration of his time at the facility he was requiring nasal cannula oxygen but this was rapidly weaned and he was discharged home on room air. Patient reports that about 3 days prior to discharge from rehabilitation he noticed having increased coughing and increasing shortness of breath and weakness but had not had any fevers. Symptoms persisted since discharge home. He presented to the ER because of the symptoms. He has not had any abdominal pain, nausea or vomiting. He is been working with therapy at home. He is not having chest pain or palpitations.   HPI/Subjective:  4/15 A/O 4. Patient requests home hospice   Assessment/Plan: Acute respiratory failure with hypoxia And hypercarbia /HCAP/Mesothelioma? -Patient with progressive shortness of breath and upper respiratory infection type symptoms over the past 6-7 days in setting of known recent treatment for HCAP and persistent x-ray changes concerning for possible parapneumonic effusion  -Broad spectrum antibiotics continue Zosyn and vancomycin -Follow up on blood cultures, sputum culture, urinary legionella and strep -Influenza PCR ordered by ER physician -CT of chest; chest findings consistent with malignancy. Patient would need biopsy to confirm see results below  -Patient has clarified he does not wish to be intubated -Respiratory virus panel negative -Influenza panel negative -Biopsy of LN, pleural-based nodules (Mesothelioma)? Patient has spoken  with [facility] and refuses any further intervention.  -Palliative Care after evaluating patient over several days feel strongly that patient is not competent. Will request psychiatric evaluation for competency in the Am -DuoNeb QID -Patient requests home Hospice , Acute renal failure superimposed on stage 3 chronic kidney disease  -Baseline renal function BUN 35 and creatinine 2.0 none -Normal saline KVO   DM, type 2, uncontrolled, with renal complications -Currently uncontrolled with CBGs greater than 33 -Metformin DC'd  -Given stage IV chronic kidney disease would not resume metformin -4/10 Hemoglobin A1c= 7.4 -Increase Resistant SSI  Leg edema, right -Negative DVT/SVT     Normocytic anemia(Baseline hemoglobin between 7 and 8) -Suspect anemia related to patient's chronic kidney disease -Anemia panel pending   Left ventricular diastolic dysfunction, NYHA class 1/Moderate to severe pulmonary hypertension  -No evidence of acute heart failure based on current symptomatology -It's presented with hypoxemia or completeness of evaluation will check BNP -Strict in and out since admission +3.9 L -Daily weight Filed Weights   07/10/15 0400 07/11/15 0500 07/13/15 0500  Weight: 98.703 kg (217 lb 9.6 oz) 97.705 kg (215 lb 6.4 oz) 102.513 kg (226 lb)   -Transfuse for hemoglobin<8 -4/11 transfused 1 unit PRBC  Essential hypertension -Metoprolol 25 mg BID   Goals of care -Consulted palliative Care;Patient with poor respiratory status possible mesothelioma; requested [facility] M biopsy however patient does not want invasive procedures. Long-term vs short-term goals of care; full DO NOT RESUSCITATE; hospice?  Code Status: DO NOT INTUBATE Family Communication: no family present at time of exam Disposition Plan: Patient requests home Hospice    Consultants: [facility] pending   Procedure/Significant Events: 4/10 CT chest WO contrast;-Persistent extensive pleural and parenchymal lung disease.  Pleural tumor (mesothelioma)? -Extensive pleural  calcifications; previous trauma/ hemorrhage infection, pleurodesis or asbestos related pleural disease?. -Complex fluid collections major fissure and bilateral pleural thickening. -Nodular opacities at both lung bases could reflect rounded atelectasis or tumor. -Extensive lung disease.-Stable borderline enlarged mediastinal lymph nodes 411 right lower extremity Doppler; negative DVT/SVT   Culture 4/10 MRSA by PCR positive 4/10 influenza A/B/H1N1 negative 4/10 HIV negative 4/11Right arm/hand NGTD 4/11 respiratory virus panel negative    Antibiotics: Zosyn 4/10 >> Vancomycin 4/10 >>   DVT prophylaxis: Lovenox   Devices    LINES / TUBES:      Continuous Infusions: . sodium chloride 10 mL/hr at 07/12/15 2025    Objective: VITAL SIGNS: Temp: 99 F (37.2 C) (04/15 1600) Temp Source: Oral (04/15 1600) BP: 171/88 mmHg (04/15 1900) Pulse Rate: 99 (04/15 1700) SPO2; FIO2:   Intake/Output Summary (Last 24 hours) at 07/13/15 1922 Last data filed at 07/13/15 1549  Gross per 24 hour  Intake  290.5 ml  Output    830 ml  Net -539.5 ml     Exam: General: A/O 4, positive acute respiratory distress, Eyes: Negative headache, negative scleral hemorrhage ENT: Negative Runny nose, negative gingival bleeding, Neck:  Negative scars, masses, torticollis, lymphadenopathy, JVD Lungs: diffuse poor air movement, positive expiratory wheezes negative crackles Cardiovascular: Tachycardic, Regular rhythm without murmur gallop or rub normal S1 and S2 Abdomen:negative abdominal pain, nondistended, positive soft, bowel sounds, no rebound, no ascites, no appreciable mass Extremities: No significant cyanosis, clubbing, or edema bilateral lower extremities Psychiatric:  Negative depression, negative anxiety, negative fatigue, negative mania  Neurologic:  Cranial nerves II through XII intact, tongue/uvula midline, all extremities muscle  strength 5/5, sensation intact throughout, negative dysarthria, negative expressive aphasia, negative receptive aphasia.   Data Reviewed: Basic Metabolic Panel:  Recent Labs Lab 07/10/2015 1155 07/02/2015 1227 07/09/15 0508 07/11/15 0339 07/13/15 0514  NA 138 137 142 142 145  K 4.3 5.4* 4.5 4.3 4.9  CL 104 107 111 111 114*  CO2 19*  --  22 20* 24  GLUCOSE 331* 333* 105* 106* 136*  BUN 39* 56* 34* 28* 28*  CREATININE 2.87* 2.60* 2.53* 2.16* 2.03*  CALCIUM 8.8*  --  8.4* 8.5* 8.6*  MG  --   --   --   --  1.9   Liver Function Tests:  Recent Labs Lab 07/09/15 0508  AST 13*  ALT 11*  ALKPHOS 56  BILITOT 0.7  PROT 6.7  ALBUMIN 2.1*   No results for input(s): LIPASE, AMYLASE in the last 168 hours. No results for input(s): AMMONIA in the last 168 hours. CBC:  Recent Labs Lab 07/21/2015 1155 07/07/2015 1227 07/09/15 0508 07/11/15 0339 07/13/15 0514  WBC 6.2  --  5.2 5.6 6.4  NEUTROABS  --   --   --   --  5.2  HGB 8.8* 9.9* 7.1* 8.6* 8.6*  HCT 27.4* 29.0* 22.1* 27.8* 28.2*  MCV 81.8  --  81.3 82.7 84.4  PLT 295  --  215 214 221   Cardiac Enzymes: No results for input(s): CKTOTAL, CKMB, CKMBINDEX, TROPONINI in the last 168 hours. BNP (last 3 results)  Recent Labs  05/09/15 1815 05/11/15 0203 07/05/2015 1714  BNP 132.0* 95.2 312.0*    ProBNP (last 3 results) No results for input(s): PROBNP in the last 8760 hours.  CBG:  Recent Labs Lab 07/12/15 1641 07/12/15 2131 07/13/15 0914 07/13/15 1302 07/13/15 1743  GLUCAP 291* 173* 117* 162* 131*    Recent Results (from the past 240 hour(s))  MRSA PCR Screening     Status: Abnormal   Collection Time: 07/11/2015  8:55 PM  Result Value Ref Range Status   MRSA by PCR POSITIVE (A) NEGATIVE Final    Comment:        The GeneXpert MRSA Assay (FDA approved for NASAL specimens only), is one component of a comprehensive MRSA colonization surveillance program. It is not intended to diagnose MRSA infection nor to guide  or monitor treatment for MRSA infections. RESULT CALLED TO, READ BACK BY AND VERIFIED WITH: L BUNN,RN@[id number]  07/02/2015 MKELLY   Blood culture (routine x 2)     Status: None (Preliminary result)   Collection Time: 07/09/15 12:05 AM  Result Value Ref Range Status   Specimen Description BLOOD RIGHT ARM  Final   Special Requests BOTTLES DRAWN AEROBIC AND ANAEROBIC 5CC  Final   Culture NO GROWTH 4 DAYS  Final   Report Status PENDING  Incomplete  Blood culture (routine x 2)     Status: None (Preliminary result)   Collection Time: 07/09/15 12:34 AM  Result Value Ref Range Status   Specimen Description BLOOD RIGHT HAND  Final   Special Requests IN PEDIATRIC BOTTLE 3CC  Final   Culture NO GROWTH 4 DAYS  Final   Report Status PENDING  Incomplete  Respiratory virus panel     Status: None   Collection Time: 07/09/15  9:46 AM  Result Value Ref Range Status   Respiratory Syncytial Virus A Negative Negative Final   Respiratory Syncytial Virus B Negative Negative Final   Influenza A Negative Negative Final   Influenza B Negative Negative Final   Parainfluenza 1 Negative Negative Final   Parainfluenza 2 Negative Negative Final   Parainfluenza 3 Negative Negative Final   Metapneumovirus Negative Negative Final   Rhinovirus Negative Negative Final   Adenovirus Negative Negative Final    Comment: (NOTE) Performed At: [facility] [place], [place] [id number] Lindon Romp MD [id number]      Studies:  Recent x-ray studies have been reviewed in detail by the Attending Physician  Scheduled Meds:  Scheduled Meds: . brimonidine  1 drop Both Eyes Q12H   And  . timolol  1 drop Both Eyes Q12H  . Chlorhexidine Gluconate Cloth  6 each Topical Q0600  . enoxaparin (LOVENOX) injection  40 mg Subcutaneous Q24H  . insulin aspart  0-20 Units Subcutaneous TID WC  . insulin aspart  0-5 Units Subcutaneous QHS  . ipratropium-albuterol  3 mL Nebulization QID  . metoprolol  tartrate  25 mg Oral BID  . piperacillin-tazobactam (ZOSYN)  IV  3.375 g Intravenous Q8H  . sodium chloride flush  3 mL Intravenous Q12H  . vancomycin  1,000 mg Intravenous Q48H    Time spent on care of this patient: 40 mins   WOODS, Geraldo Docker , MD  [facility] Office  [phone] Pager - [phone]  On-Call/Text Page:      [email or web address]      password [email or web address]  If 7PM-7AM, please contact night-coverage [email or web address] Password [email or web address] 07/13/2015, 7:22 PM   LOS: 5 days   Care during the described time interval was provided by me .  I have reviewed this patient's available data, including medical history, events of note, physical examination, and all test results as part of my evaluation. I have personally reviewed and interpreted all radiology studies.   Dia Crawford, MD [phone] Pager

## 2015-07-13 NOTE — Plan of Care (Signed)
Problem: Education: Goal: Knowledge of [facility] Education information/materials will improve Outcome: Progressing Talked about discharge to select versus home health.  Patient wants to go home.

## 2015-07-13 NOTE — Progress Notes (Signed)
Pt was turned up to 80 I decreased back to 50%

## 2015-07-14 DIAGNOSIS — F329 Major depressive disorder, single episode, unspecified: Secondary | ICD-10-CM

## 2015-07-14 DIAGNOSIS — D649 Anemia, unspecified: Secondary | ICD-10-CM

## 2015-07-14 DIAGNOSIS — N183 Chronic kidney disease, stage 3 (moderate): Secondary | ICD-10-CM

## 2015-07-14 DIAGNOSIS — IMO0002 Reserved for concepts with insufficient information to code with codable children: Secondary | ICD-10-CM | POA: Insufficient documentation

## 2015-07-14 DIAGNOSIS — E1122 Type 2 diabetes mellitus with diabetic chronic kidney disease: Secondary | ICD-10-CM

## 2015-07-14 DIAGNOSIS — E1165 Type 2 diabetes mellitus with hyperglycemia: Secondary | ICD-10-CM

## 2015-07-14 LAB — CBC WITH DIFFERENTIAL/PLATELET
Basophils Absolute: 0 10*3/uL (ref 0.0–0.1)
Basophils Relative: 0 %
Eosinophils Absolute: 0.1 10*3/uL (ref 0.0–0.7)
Eosinophils Relative: 2 %
HEMATOCRIT: 26.9 % — AB (ref 39.0–52.0)
HEMOGLOBIN: 8.2 g/dL — AB (ref 13.0–17.0)
LYMPHS ABS: 0.4 10*3/uL — AB (ref 0.7–4.0)
LYMPHS PCT: 7 %
MCH: 25.9 pg — AB (ref 26.0–34.0)
MCHC: 30.5 g/dL (ref 30.0–36.0)
MCV: 84.9 fL (ref 78.0–100.0)
MONOS PCT: 6 %
Monocytes Absolute: 0.4 10*3/uL (ref 0.1–1.0)
NEUTROS ABS: 5 10*3/uL (ref 1.7–7.7)
NEUTROS PCT: 85 %
Platelets: 221 10*3/uL (ref 150–400)
RBC: 3.17 MIL/uL — AB (ref 4.22–5.81)
RDW: 19.1 % — ABNORMAL HIGH (ref 11.5–15.5)
WBC: 5.9 10*3/uL (ref 4.0–10.5)

## 2015-07-14 LAB — CULTURE, BLOOD (ROUTINE X 2)
Culture: NO GROWTH
Culture: NO GROWTH

## 2015-07-14 LAB — BASIC METABOLIC PANEL
ANION GAP: 9 (ref 5–15)
BUN: 25 mg/dL — ABNORMAL HIGH (ref 6–20)
CHLORIDE: 111 mmol/L (ref 101–111)
CO2: 22 mmol/L (ref 22–32)
CREATININE: 1.97 mg/dL — AB (ref 0.61–1.24)
Calcium: 8.6 mg/dL — ABNORMAL LOW (ref 8.9–10.3)
GFR calc non Af Amer: 31 mL/min — ABNORMAL LOW (ref >60)
GFR, EST AFRICAN AMERICAN: 36 mL/min — AB (ref >60)
Glucose, Bld: 128 mg/dL — ABNORMAL HIGH (ref 65–99)
POTASSIUM: 4.4 mmol/L (ref 3.5–5.1)
SODIUM: 142 mmol/L (ref 135–145)

## 2015-07-14 LAB — MAGNESIUM: MAGNESIUM: 1.8 mg/dL (ref 1.7–2.4)

## 2015-07-14 LAB — GLUCOSE, CAPILLARY
GLUCOSE-CAPILLARY: 193 mg/dL — AB (ref 65–99)
Glucose-Capillary: 116 mg/dL — ABNORMAL HIGH (ref 65–99)
Glucose-Capillary: 183 mg/dL — ABNORMAL HIGH (ref 65–99)
Glucose-Capillary: 141 mg/dL — ABNORMAL HIGH (ref 65–99)

## 2015-07-14 MED ORDER — CITALOPRAM HYDROBROMIDE 20 MG PO TABS
10.0000 mg | ORAL_TABLET | Freq: Every day | ORAL | Status: DC
  Administered 2015-07-15 – 2015-07-16 (×2): 10 mg via ORAL
  Filled 2015-07-14 (×3): qty 1

## 2015-07-14 MED ORDER — MORPHINE SULFATE (PF) 2 MG/ML IV SOLN
1.0000 mg | INTRAVENOUS | Status: DC | PRN
  Administered 2015-07-14 – 2015-07-15 (×3): 2 mg via INTRAVENOUS
  Filled 2015-07-14 (×4): qty 1

## 2015-07-14 MED ORDER — METOPROLOL TARTRATE 25 MG PO TABS
37.5000 mg | ORAL_TABLET | Freq: Two times a day (BID) | ORAL | Status: DC
  Administered 2015-07-14 – 2015-07-16 (×5): 37.5 mg via ORAL
  Filled 2015-07-14 (×7): qty 1

## 2015-07-14 NOTE — Consult Note (Signed)
[facility] Face-to-Face Psychiatry Consult   Reason for Consult: Capacity to make medical decision Referring Physician:  Dr. Clydene Laming Patient Identification: Hector Sloan MRN:  [id number] Principal Diagnosis: HCAP (healthcare-associated pneumonia) Diagnosis:   Patient Active Problem List   Diagnosis Date Noted  . Major depressive disorder, single episode [F32.9] 07/14/2015  . Essential hypertension [I10]   . Uncontrolled type 2 diabetes mellitus with complication (HCC) [G28.3, E11.65]   . Type 2 diabetes mellitus with diabetic nephropathy ([place]) [E11.21]   . Dyspnea [R06.00]   . Palliative care encounter [Z51.5]   . Goals of care, counseling/discussion [Z71.89]   . Encounter for hospice care discussion [Z51.5]   . Uncontrolled type 2 diabetes mellitus with diabetic nephropathy (HCC) [E11.21, E11.65]   . Pulmonary hypertension ([place]) [I27.2]   . Acute respiratory failure with hypoxia ([place]) [J96.01] 07/12/2015  . Elevated lactic acid level [E87.2] 07/01/2015  . Left ventricular diastolic dysfunction, NYHA class 1 [I51.9] 07/23/2015  . Moderate to severe pulmonary hypertension ([place]) [I27.2] 07/24/2015  . Leg edema, right [R60.0] 07/01/2015  . Hypercarbia [R06.89] 07/02/2015  . AKI (acute kidney injury) ([place]) [N17.9] 07/28/2015  . CKD (chronic kidney disease) [N18.9] 07/02/2015  . HCAP (healthcare-associated pneumonia) [J18.9] 05/09/2015  . DM (diabetes mellitus), type 2, uncontrolled, with renal complications ([place]) [M62.94, E11.65] 05/09/2015  . Acute renal failure superimposed on stage 3 chronic kidney disease ([place]) [N17.9, N18.3] 04/16/2015  . Normocytic anemia [D64.9] 04/16/2015    Total Time spent with patient: 1 hour  Subjective:   Hector Sloan is a 75 y.o. male patient admitted with pneumonia.  HPI: Thank you for asking me to do a psychiatric evaluation on Hector Sloan, a 75 year old man with multiple medical history including; DM Type 2 on insulin, DM Nephropathy, CKD stage IV,  Normocytic anemia, Chronic Diastolic CHF, Pulmonary Hypertension. Patient is a poor historian who also appears to have cognitive impairment. He denies past history of mental illness, such as  psychosis, delusional thinking, suicide or homicidal thoughts, intent or plan. It is noted that he is estranged from his children and history could not be obtained from them. Regarding his capacity to make medical decision; patient is alert, awake but disoriented to time, place, person and situation. Patient is pleasant , cooperative and has been accepting medical care offered to him. However, due to cognitive deficit(MMSE-10/30), he is unable to demonstrates appreciation and a clear understanding of questions asked of him regarding his health status and care. He is unable to demonstrate good reasoning and express his choice of care when he is giving opportunity to do so. In my opinion, this patient lacks capacity to make his own medical decision at this point. He will needs a health care proxy appointment to make medical decision for him.  It is noteworthy that patient appear depressed, dysphoric with low energy level and lack of motivation. He denies drugs and alcohol abuse.   Past Psychiatric History: denies  Risk to Self: Is patient at risk for suicide?: No Risk to Others:   Prior Inpatient Therapy:   Prior Outpatient Therapy:    Past Medical History:  Past Medical History  Diagnosis Date  . Hypertension   . S/P BKA (below knee amputation) (HCC) IN 1998    LEFT/  HAS PROTHESIS  . History of kidney stones   . Peripheral vascular disease ([place]) S/P LEFT BKA 1998  . Glaucoma of both eyes   . Type 2 diabetes mellitus ([place])   . Diabetic retinopathy ([place])   . 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 CKD (chronic kidney disease), stage III     nephrologist--  dr Florene Glen  . At risk for sleep apnea     STOP-BANG= 4    SENT TO PCP  11-22-2013    Past Surgical History  Procedure Laterality Date  . Transurethral resection of bladder tumor  12-03-2010 ;   07-16-2010;,  03-12-2010    BLADDER CANCER (2011 W/ INSTILLATION CHEMO IN BLADDER)  . Multiple eye surg's (most of them of left)  LAST ONE 2009  (LEFT EYE)    INCLUDING VITRECTOMY'S / REPAIR'S  DETACHED RETINA/  GLAUCOMA SETON'S  . Below knee leg amputation Left 1998  . Cystoscopy with biopsy  06/03/2011    Procedure: CYSTOSCOPY WITH BIOPSY;  Surgeon: Franchot Gallo, MD;  Location: [facility];  Service: Urology;  Laterality: N/A;  . Cataract extraction w/ intraocular lens  implant, bilateral    . Cystoscopy w/ retrogrades  10/29/2011    Procedure: CYSTOSCOPY WITH RETROGRADE PYELOGRAM;  Surgeon: Franchot Gallo, MD;  Location: [facility];  Service: Urology;  Laterality: Bilateral;  30 MIN REQUESTED   . Transurethral resection of bladder tumor  10/29/2011    Procedure: TRANSURETHRAL RESECTION OF BLADDER TUMOR (TURBT);  Surgeon: Franchot Gallo, MD;  Location: [facility];  Service: Urology;  Laterality: N/A;  . Transurethral resection of bladder tumor  04/04/2012    Procedure: TRANSURETHRAL RESECTION OF BLADDER TUMOR (TURBT);  Surgeon: Franchot Gallo, MD;  Location: [facility];  Service: Urology;  Laterality: N/A;  COLD CUP BIOPSY   . I  &  d right foot  04-22-2006    SECOND DEGREE BURN  . Cystoscopy w/ retrogrades Bilateral 03/02/2013    Procedure: CYSTOSCOPY WITH RETROGRADE PYELOGRAM AND BLADDER BIOPSY;  Surgeon: Franchot Gallo, MD;  Location: [facility];  Service: Urology;  Laterality: Bilateral;  . Cystoscopy w/ retrogrades Bilateral 11/27/2013    Procedure: CYSTOSCOPY WITH RETROGRADE PYELOGRAM;  Surgeon: Jorja Loa, MD;  Location: [facility];  Service: Urology;  Laterality: Bilateral;  . Cystoscopy with biopsy N/A 11/27/2013    Procedure: CYSTOSCOPY WITH BLADDER  BIOPSIES, BLADDER AND RENAL WASHINGS;  Surgeon: Jorja Loa, MD;  Location: [facility];  Service: Urology;  Laterality: N/A;   Family History:  Family History  Problem Relation Age of Onset  . Diabetes Mellitus II Other    Family Psychiatric  History:   Social History:  History  Alcohol Use No     History  Drug Use No    Social History   Social History  . Marital Status: Single    Spouse Name: N/A  . Number of Children: N/A  . Years of Education: N/A   Social History Main Topics  . Smoking status: Former Smoker -- 1.00 packs/day for 5 years    Types: Cigarettes    Quit date: 05/29/1990  . Smokeless tobacco: Never Used  . Alcohol Use: No  . Drug Use: No  . Sexual Activity: Not Asked   Other Topics Concern  . None   Social History Narrative   Additional Social History:    Allergies:  No Known Allergies  Labs:  Results for orders placed or performed during the hospital encounter of 07/11/2015 (from the past 48 hour(s))  Glucose, capillary     Status: Abnormal   Collection Time: 07/12/15  4:41 PM  Result Value Ref Range   Glucose-Capillary 291 (H) 65 - 99 mg/dL  Vancomycin, trough     Status: Abnormal   Collection Time: 07/12/15  7:59 PM  Result Value Ref Range   Vancomycin Tr 26 (HH) 10.0 - 20.0 ug/mL    Comment: CRITICAL RESULT CALLED TO, READ BACK BY AND VERIFIED WITH: NIELSON T,RN 07/12/15 2043 WAYK   Glucose, capillary     Status: Abnormal   Collection Time: 07/12/15  9:31 PM  Result Value Ref Range   Glucose-Capillary 173 (H) 65 - 99 mg/dL   Comment 1 Notify RN    Comment 2 Document in Chart   Basic metabolic panel     Status: Abnormal   Collection Time: 07/13/15  5:14 AM  Result Value Ref Range   Sodium 145 135 - 145 mmol/L   Potassium 4.9 3.5 - 5.1 mmol/L   Chloride 114 (H) 101 - 111 mmol/L   CO2 24 22 - 32 mmol/L   Glucose, Bld 136 (H) 65 - 99 mg/dL   BUN 28 (H) 6 - 20 mg/dL   Creatinine, Ser 2.03 (H) 0.61 - 1.24 mg/dL    Calcium 8.6 (L) 8.9 - 10.3 mg/dL   GFR calc non Af Amer 30 (L) >60 mL/min   GFR calc Af Amer 35 (L) >60 mL/min    Comment: (NOTE) The eGFR has been calculated using the CKD EPI equation. This calculation has not been validated in all clinical situations. eGFR's persistently <60 mL/min signify possible Chronic Kidney Disease.    Anion gap 7 5 - 15  CBC with Differential/Platelet     Status: Abnormal   Collection Time: 07/13/15  5:14 AM  Result Value Ref Range   WBC 6.4 4.0 - 10.5 K/uL   RBC 3.34 (L) 4.22 - 5.81 MIL/uL   Hemoglobin 8.6 (L) 13.0 - 17.0 g/dL   HCT 28.2 (L) 39.0 - 52.0 %   MCV 84.4 78.0 - 100.0 fL   MCH 25.7 (L) 26.0 - 34.0 pg   MCHC 30.5 30.0 - 36.0 g/dL   RDW 19.4 (H) 11.5 - 15.5 %   Platelets 221 150 - 400 K/uL   Neutrophils Relative % 81 %   Neutro Abs 5.2 1.7 - 7.7 K/uL   Lymphocytes Relative 8 %   Lymphs Abs 0.5 (L) 0.7 - 4.0 K/uL   Monocytes Relative 8 %   Monocytes Absolute 0.5 0.1 - 1.0 K/uL   Eosinophils Relative 3 %   Eosinophils Absolute 0.2 0.0 - 0.7 K/uL   Basophils Relative 0 %   Basophils Absolute 0.0 0.0 - 0.1 K/uL  Magnesium     Status: None   Collection Time: 07/13/15  5:14 AM  Result Value Ref Range   Magnesium 1.9 1.7 - 2.4 mg/dL  Blood gas, arterial     Status: Abnormal   Collection Time: 07/13/15  6:15 AM  Result Value Ref Range   FIO2 0.45    Delivery systems BILEVEL POSITIVE AIRWAY PRESSURE    LHR 10 resp/min   Inspiratory PAP 16    Expiratory PAP 6    pH, Arterial 7.278 (L) 7.350 - 7.450   pCO2 arterial 55.6 (H) 35.0 - 45.0 mmHg   pO2, Arterial 47.9 (L) 80.0 - 100.0 mmHg   Bicarbonate 25.5 (H) 20.0 - 24.0 mEq/L   TCO2 27.3 0 - 100 mmol/L   Acid-base deficit 0.6 0.0 - 2.0 mmol/L   O2 Saturation 81.2 %   Patient temperature 97.1  Collection site LEFT RADIAL    Drawn by [phone]    Sample type ARTERIAL DRAW    Allens test (pass/fail) PASS PASS  Glucose, capillary     Status: Abnormal   Collection Time: 07/13/15  9:14 AM   Result Value Ref Range   Glucose-Capillary 117 (H) 65 - 99 mg/dL  Glucose, capillary     Status: Abnormal   Collection Time: 07/13/15  1:02 PM  Result Value Ref Range   Glucose-Capillary 162 (H) 65 - 99 mg/dL  Glucose, capillary     Status: Abnormal   Collection Time: 07/13/15  5:43 PM  Result Value Ref Range   Glucose-Capillary 131 (H) 65 - 99 mg/dL  Glucose, capillary     Status: None   Collection Time: 07/13/15  8:29 PM  Result Value Ref Range   Glucose-Capillary 85 65 - 99 mg/dL  Basic metabolic panel     Status: Abnormal   Collection Time: 07/14/15  4:26 AM  Result Value Ref Range   Sodium 142 135 - 145 mmol/L   Potassium 4.4 3.5 - 5.1 mmol/L   Chloride 111 101 - 111 mmol/L   CO2 22 22 - 32 mmol/L   Glucose, Bld 128 (H) 65 - 99 mg/dL   BUN 25 (H) 6 - 20 mg/dL   Creatinine, Ser 1.97 (H) 0.61 - 1.24 mg/dL   Calcium 8.6 (L) 8.9 - 10.3 mg/dL   GFR calc non Af Amer 31 (L) >60 mL/min   GFR calc Af Amer 36 (L) >60 mL/min    Comment: (NOTE) The eGFR has been calculated using the CKD EPI equation. This calculation has not been validated in all clinical situations. eGFR's persistently <60 mL/min signify possible Chronic Kidney Disease.    Anion gap 9 5 - 15  CBC with Differential/Platelet     Status: Abnormal   Collection Time: 07/14/15  4:26 AM  Result Value Ref Range   WBC 5.9 4.0 - 10.5 K/uL   RBC 3.17 (L) 4.22 - 5.81 MIL/uL   Hemoglobin 8.2 (L) 13.0 - 17.0 g/dL   HCT 26.9 (L) 39.0 - 52.0 %   MCV 84.9 78.0 - 100.0 fL   MCH 25.9 (L) 26.0 - 34.0 pg   MCHC 30.5 30.0 - 36.0 g/dL   RDW 19.1 (H) 11.5 - 15.5 %   Platelets 221 150 - 400 K/uL   Neutrophils Relative % 85 %   Neutro Abs 5.0 1.7 - 7.7 K/uL   Lymphocytes Relative 7 %   Lymphs Abs 0.4 (L) 0.7 - 4.0 K/uL   Monocytes Relative 6 %   Monocytes Absolute 0.4 0.1 - 1.0 K/uL   Eosinophils Relative 2 %   Eosinophils Absolute 0.1 0.0 - 0.7 K/uL   Basophils Relative 0 %   Basophils Absolute 0.0 0.0 - 0.1 K/uL   Magnesium     Status: None   Collection Time: 07/14/15  4:26 AM  Result Value Ref Range   Magnesium 1.8 1.7 - 2.4 mg/dL  Glucose, capillary     Status: Abnormal   Collection Time: 07/14/15  7:32 AM  Result Value Ref Range   Glucose-Capillary 116 (H) 65 - 99 mg/dL  Glucose, capillary     Status: Abnormal   Collection Time: 07/14/15 12:30 PM  Result Value Ref Range   Glucose-Capillary 183 (H) 65 - 99 mg/dL   Comment 1 Notify RN    Comment 2 Document in Chart     Current Facility-Administered Medications  Medication Dose Route Frequency  Provider Last Rate Last Dose  . 0.9 %  sodium chloride infusion   Intravenous Continuous Allie Bossier, MD 10 mL/hr at 07/12/15 2025    . acetaminophen (TYLENOL) tablet 650 mg  650 mg Oral Q6H PRN Samella Parr, NP       Or  . acetaminophen (TYLENOL) suppository 650 mg  650 mg Rectal Q6H PRN Samella Parr, NP      . albuterol (PROVENTIL) (2.5 MG/3ML) 0.083% nebulizer solution 2.5 mg  2.5 mg Nebulization Q2H PRN Cherene Altes, MD   2.5 mg at 07/13/15 0358  . brimonidine (ALPHAGAN) 0.2 % ophthalmic solution 1 drop  1 drop Both Eyes Q12H Samella Parr, NP   1 drop at 07/14/15 0942   And  . timolol (TIMOPTIC) 0.5 % ophthalmic solution 1 drop  1 drop Both Eyes Q12H Samella Parr, NP   1 drop at 07/14/15 0943  . citalopram (CELEXA) tablet 10 mg  10 mg Oral Daily Kateland Leisinger, MD      . enoxaparin (LOVENOX) injection 40 mg  40 mg Subcutaneous Q24H Cecilio Asper Batchelder, RPH   40 mg at 07/13/15 2127  . insulin aspart (novoLOG) injection 0-20 Units  0-20 Units Subcutaneous TID WC Ritta Slot, NP   4 Units at 07/14/15 1200  . insulin aspart (novoLOG) injection 0-5 Units  0-5 Units Subcutaneous QHS Cherene Altes, MD   0 Units at 07/10/15 2200  . ipratropium-albuterol (DUONEB) 0.5-2.5 (3) MG/3ML nebulizer solution 3 mL  3 mL Nebulization QID Cherene Altes, MD   3 mL at 07/14/15 1144  . LORazepam (ATIVAN) tablet 0.5 mg  0.5 mg Oral Q6H PRN  Melton Alar, PA-C      . metoprolol tartrate (LOPRESSOR) tablet 37.5 mg  37.5 mg Oral BID Allie Bossier, MD   37.5 mg at 07/14/15 0941  . morphine 2 MG/ML injection 1-2 mg  1-2 mg Intravenous Q2H PRN Allie Bossier, MD      . morphine CONCENTRATE 10 MG/0.5ML oral solution 5 mg  5 mg Oral Q2H PRN Melton Alar, PA-C   5 mg at 07/12/15 1807  . sodium chloride flush (NS) 0.9 % injection 3 mL  3 mL Intravenous Q12H Samella Parr, NP   3 mL at 07/13/15 2129    Musculoskeletal: Strength & Muscle Tone: decreased Gait & Station: unable to stand Patient leans: N/A  Psychiatric Specialty Exam: Review of Systems  Unable to perform ROS   Blood pressure 173/86, pulse 100, temperature 97.9 F (36.6 C), temperature source Oral, resp. rate 20, height 6' 2"  (1.88 m), weight 98.476 kg (217 lb 1.6 oz), SpO2 99 %.Body mass index is 27.86 kg/(m^2).  General Appearance: Casual  Eye Contact::  Good  Speech:  Clear and Coherent  Volume:  Decreased  Mood:  Depressed and Dysphoric  Affect:  Constricted  Thought Process:  Circumstantial  Orientation:  Other:  disoriented x4  Thought Content:  Negative  Suicidal Thoughts:  No  Homicidal Thoughts:  No  Memory:  Immediate;   Fair Recent;   Poor Remote;   Poor  Judgement:  Impaired  Insight:  Lacking  Psychomotor Activity:  Psychomotor Retardation  Concentration:  Fair  Recall:  Poor  Fund of Knowledge:Poor  Language: Good  Akathisia:  No  Handed:  Right  AIMS (if indicated):     Assets:  Desire for Improvement  ADL's:  Impaired  Cognition: Impaired,  Moderate  Sleep:  fair   Treatment Plan Summary: Medication management  -Celexa 42m daily for Depression.  Recommendation. -Patient needs health care proxy appointed for him: He lacks capacity to make own medical decision at this time.  Disposition: - No evidence of imminent risk to self or others at present.   -Patient does not meet criteria for psychiatric inpatient  admission. -Supportive therapy provided about ongoing stressors.  ACorena Pilgrim MD 07/14/2015 4:17 PM

## 2015-07-14 NOTE — Progress Notes (Signed)
[place] TEAM 1 - Stepdown/ICU TEAM Progress Note  Hector Sloan [id number] DOB: 1941-02-08 DOA: 07/26/2015 PCP: [name], MD  Admit HPI / Brief Narrative: 75 year old BM PMHx DM Type 2 on insulin, DM Nephropathy, CKD stage IV, Normocytic anemia, Chronic Diastolic CHF, Pulmonary Hypertension,   Discharged from this facility on 05/14/15 after admission for H. He was sent to a rehabilitation facility and he was recently discharged this past Monday. Apparently throughout the duration of his time at the facility he was requiring nasal cannula oxygen but this was rapidly weaned and he was discharged home on room air. Patient reports that about 3 days prior to discharge from rehabilitation he noticed having increased coughing and increasing shortness of breath and weakness but had not had any fevers. Symptoms persisted since discharge home. He presented to the ER because of the symptoms. He has not had any abdominal pain, nausea or vomiting. He is been working with therapy at home. He is not having chest pain or palpitations.   HPI/Subjective:  4/16 A/O 4. Patient requests to eat his breakfast (currently on BiPAP).    Assessment/Plan: Acute respiratory failure with hypoxia And hypercarbia /HCAP/Mesothelioma? -Continue decline in patient's respiratory status since admission. Chest CT; changes c/w parapneumonic effusion vs malignancy. Patient refuses invasive procedures. -Completed course Broad spectrum antibiotics. -Blood cultures, sputum culture, urinary legionella and strep, influenza panel negative -CT of chest; chest findings consistent with malignancy. Patient has refused biopsy   -Biopsy of LN, pleural-based nodules (Mesothelioma)? Patient has spoken with [facility] and refuses any further intervention.  -DuoNeb QID -Morphine 1-2 mg PRN air hunger -Patient requests home Hospice. On 4/15 discussed extensively case with [facility] do not believe patient  is competent to make this decision. Have requested Psychiatric evaluation for competency -Off BiPAP during the day unless patient has altered mental status. Continue morphine for feeling of air hunger , Acute renal failure superimposed on stage 3 chronic kidney disease  -Baseline renal function BUN 35 and creatinine 2.0 none -Normal saline KVO -At baseline   DM, type 2, uncontrolled, with renal complications -Currently uncontrolled with CBGs greater than 33 -Metformin DC'd  -Given stage IV chronic kidney disease would not resume metformin -4/10 Hemoglobin A1c= 7.4 -Resistant SSI  Leg edema, right -Negative DVT/SVT     Normocytic anemia(Baseline hemoglobin between 7 and 8) -Suspect anemia related to patient's chronic kidney disease -Anemia panel pending   Left ventricular diastolic dysfunction, NYHA class 1/Moderate to severe pulmonary hypertension  -No evidence of acute heart failure based on current symptomatology -It's presented with hypoxemia or completeness of evaluation will check BNP -Strict in and out since admission + 3.5 L -Daily weight Filed Weights   07/11/15 0500 07/13/15 0500 07/14/15 0417  Weight: 97.705 kg (215 lb 6.4 oz) 102.513 kg (226 lb) 98.476 kg (217 lb 1.6 oz)   -Transfuse for hemoglobin<8 -4/11 transfused 1 unit PRBC  Essential hypertension -Increase Metoprolol 37.5 mg BID   Goals of care -Consulted palliative Care;Patient with poor respiratory status possible mesothelioma; requested [facility] biopsy however patient does not want invasive procedures. Long-term vs short-term goals of care; full DO NOT RESUSCITATE; hospice?   Code Status: DO NOT INTUBATE Family Communication: no family present at time of exam Disposition Plan: Patient requests home Hospice    Consultants: Dr.Wesam Kathryne Sharper Casey  Procedure/Significant Events: 4/10 CT chest WO contrast;-Persistent extensive pleural and parenchymal lung  disease. Pleural tumor (mesothelioma)? -Extensive pleural calcifications;  previous trauma/ hemorrhage infection, pleurodesis or asbestos related pleural disease?. -Complex fluid collections major fissure and bilateral pleural thickening. -Nodular opacities at both lung bases could reflect rounded atelectasis or tumor. -Extensive lung disease.-Stable borderline enlarged mediastinal lymph nodes 411 right lower extremity Doppler; negative DVT/SVT   Culture 4/10 MRSA by PCR positive 4/10 influenza A/B/H1N1 negative 4/10 HIV negative 4/11Right arm/hand NGTD 4/11 respiratory virus panel negative    Antibiotics: Zosyn 4/10 >> 4/16 Vancomycin 4/10 >> 4/16   DVT prophylaxis: Lovenox   Devices    LINES / TUBES:      Continuous Infusions: . sodium chloride 10 mL/hr at 07/12/15 2025    Objective: VITAL SIGNS: Temp: 98.6 F (37 C) (04/16 0500) Temp Source: Axillary (04/16 0500) BP: 168/86 mmHg (04/16 0600) Pulse Rate: 100 (04/16 0600) SPO2; FIO2:   Intake/Output Summary (Last 24 hours) at 07/14/15 0654 Last data filed at 07/14/15 0600  Gross per 24 hour  Intake 1079.17 ml  Output    955 ml  Net 124.17 ml     Exam: General: A/O 4, positive acute respiratory distress, Eyes: Negative headache, negative scleral hemorrhage ENT: Negative Runny nose, negative gingival bleeding, Neck:  Negative scars, masses, torticollis, lymphadenopathy, JVD Lungs: diffuse poor air movement, positive expiratory wheezes negative crackles Cardiovascular: Tachycardic, Regular rhythm without murmur gallop or rub normal S1 and S2 Abdomen:negative abdominal pain, nondistended, positive soft, bowel sounds, no rebound, no ascites, no appreciable mass Extremities: No significant cyanosis, clubbing, or edema bilateral lower extremities Psychiatric:  Negative depression, negative anxiety, negative fatigue, negative mania  Neurologic:  Cranial nerves II through XII intact, tongue/uvula midline,  all extremities muscle strength 5/5, sensation intact throughout, negative dysarthria, negative expressive aphasia, negative receptive aphasia.   Data Reviewed: Basic Metabolic Panel:  Recent Labs Lab 07/06/2015 1155 07/24/2015 1227 07/09/15 0508 07/11/15 0339 07/13/15 0514 07/14/15 0426  NA 138 137 142 142 145 142  K 4.3 5.4* 4.5 4.3 4.9 4.4  CL 104 107 111 111 114* 111  CO2 19*  --  22 20* 24 22  GLUCOSE 331* 333* 105* 106* 136* 128*  BUN 39* 56* 34* 28* 28* 25*  CREATININE 2.87* 2.60* 2.53* 2.16* 2.03* 1.97*  CALCIUM 8.8*  --  8.4* 8.5* 8.6* 8.6*  MG  --   --   --   --  1.9 1.8   Liver Function Tests:  Recent Labs Lab 07/09/15 0508  AST 13*  ALT 11*  ALKPHOS 56  BILITOT 0.7  PROT 6.7  ALBUMIN 2.1*   No results for input(s): LIPASE, AMYLASE in the last 168 hours. No results for input(s): AMMONIA in the last 168 hours. CBC:  Recent Labs Lab 07/21/2015 1155 07/03/2015 1227 07/09/15 0508 07/11/15 0339 07/13/15 0514 07/14/15 0426  WBC 6.2  --  5.2 5.6 6.4 5.9  NEUTROABS  --   --   --   --  5.2 5.0  HGB 8.8* 9.9* 7.1* 8.6* 8.6* 8.2*  HCT 27.4* 29.0* 22.1* 27.8* 28.2* 26.9*  MCV 81.8  --  81.3 82.7 84.4 84.9  PLT 295  --  215 214 221 221   Cardiac Enzymes: No results for input(s): CKTOTAL, CKMB, CKMBINDEX, TROPONINI in the last 168 hours. BNP (last 3 results)  Recent Labs  05/09/15 1815 05/11/15 0203 07/28/2015 1714  BNP 132.0* 95.2 312.0*    ProBNP (last 3 results) No results for input(s): PROBNP in the last 8760 hours.  CBG:  Recent Labs Lab 07/12/15 2131 07/13/15 0914 07/13/15 1302 07/13/15 1743  07/13/15 2029  GLUCAP 173* 117* 162* 131* 85    Recent Results (from the past 240 hour(s))  MRSA PCR Screening     Status: Abnormal   Collection Time: 07/27/2015  8:55 PM  Result Value Ref Range Status   MRSA by PCR POSITIVE (A) NEGATIVE Final    Comment:        The GeneXpert MRSA Assay (FDA approved for NASAL specimens only), is one component of  a comprehensive MRSA colonization surveillance program. It is not intended to diagnose MRSA infection nor to guide or monitor treatment for MRSA infections. RESULT CALLED TO, READ BACK BY AND VERIFIED WITH: L BUNN,RN@[id number]  07/16/2015 MKELLY   Blood culture (routine x 2)     Status: None (Preliminary result)   Collection Time: 07/09/15 12:05 AM  Result Value Ref Range Status   Specimen Description BLOOD RIGHT ARM  Final   Special Requests BOTTLES DRAWN AEROBIC AND ANAEROBIC 5CC  Final   Culture NO GROWTH 4 DAYS  Final   Report Status PENDING  Incomplete  Blood culture (routine x 2)     Status: None (Preliminary result)   Collection Time: 07/09/15 12:34 AM  Result Value Ref Range Status   Specimen Description BLOOD RIGHT HAND  Final   Special Requests IN PEDIATRIC BOTTLE 3CC  Final   Culture NO GROWTH 4 DAYS  Final   Report Status PENDING  Incomplete  Respiratory virus panel     Status: None   Collection Time: 07/09/15  9:46 AM  Result Value Ref Range Status   Respiratory Syncytial Virus A Negative Negative Final   Respiratory Syncytial Virus B Negative Negative Final   Influenza A Negative Negative Final   Influenza B Negative Negative Final   Parainfluenza 1 Negative Negative Final   Parainfluenza 2 Negative Negative Final   Parainfluenza 3 Negative Negative Final   Metapneumovirus Negative Negative Final   Rhinovirus Negative Negative Final   Adenovirus Negative Negative Final    Comment: (NOTE) Performed At: [facility] [place], [place] [id number] Lindon Romp MD [id number]      Studies:  Recent x-ray studies have been reviewed in detail by the Attending Physician  Scheduled Meds:  Scheduled Meds: . brimonidine  1 drop Both Eyes Q12H   And  . timolol  1 drop Both Eyes Q12H  . enoxaparin (LOVENOX) injection  40 mg Subcutaneous Q24H  . insulin aspart  0-20 Units Subcutaneous TID WC  . insulin aspart  0-5 Units Subcutaneous QHS  .  ipratropium-albuterol  3 mL Nebulization QID  . metoprolol tartrate  25 mg Oral BID  . piperacillin-tazobactam (ZOSYN)  IV  3.375 g Intravenous Q8H  . sodium chloride flush  3 mL Intravenous Q12H  . vancomycin  1,000 mg Intravenous Q48H    Time spent on care of this patient: 40 mins   WOODS, Geraldo Docker , MD  [facility] Office  [phone] Pager - [phone]  On-Call/Text Page:      [email or web address]      password [email or web address]  If 7PM-7AM, please contact night-coverage [email or web address] Password [email or web address] 07/14/2015, 6:54 AM   LOS: 6 days   Care during the described time interval was provided by me .  I have reviewed this patient's available data, including medical history, events of note, physical examination, and all test results as part of my evaluation. I have personally reviewed and interpreted all radiology studies.   Dia Crawford, MD [phone] Pager

## 2015-07-15 LAB — BASIC METABOLIC PANEL
Anion gap: 9 (ref 5–15)
BUN: 25 mg/dL — ABNORMAL HIGH (ref 6–20)
CHLORIDE: 111 mmol/L (ref 101–111)
CO2: 24 mmol/L (ref 22–32)
Calcium: 8.7 mg/dL — ABNORMAL LOW (ref 8.9–10.3)
Creatinine, Ser: 2.01 mg/dL — ABNORMAL HIGH (ref 0.61–1.24)
GFR calc non Af Amer: 31 mL/min — ABNORMAL LOW (ref >60)
GFR, EST AFRICAN AMERICAN: 35 mL/min — AB (ref >60)
Glucose, Bld: 117 mg/dL — ABNORMAL HIGH (ref 65–99)
POTASSIUM: 4.5 mmol/L (ref 3.5–5.1)
SODIUM: 144 mmol/L (ref 135–145)

## 2015-07-15 LAB — GLUCOSE, CAPILLARY
GLUCOSE-CAPILLARY: 112 mg/dL — AB (ref 65–99)
GLUCOSE-CAPILLARY: 120 mg/dL — AB (ref 65–99)
GLUCOSE-CAPILLARY: 100 mg/dL — AB (ref 65–99)
Glucose-Capillary: 119 mg/dL — ABNORMAL HIGH (ref 65–99)
Glucose-Capillary: 121 mg/dL — ABNORMAL HIGH (ref 65–99)

## 2015-07-15 LAB — CBC WITH DIFFERENTIAL/PLATELET
Basophils Absolute: 0 10*3/uL (ref 0.0–0.1)
Basophils Relative: 0 %
EOS ABS: 0.1 10*3/uL (ref 0.0–0.7)
Eosinophils Relative: 2 %
HEMATOCRIT: 27.1 % — AB (ref 39.0–52.0)
HEMOGLOBIN: 8.2 g/dL — AB (ref 13.0–17.0)
LYMPHS ABS: 0.5 10*3/uL — AB (ref 0.7–4.0)
LYMPHS PCT: 8 %
MCH: 25.6 pg — AB (ref 26.0–34.0)
MCHC: 30.3 g/dL (ref 30.0–36.0)
MCV: 84.7 fL (ref 78.0–100.0)
Monocytes Absolute: 0.8 10*3/uL (ref 0.1–1.0)
Monocytes Relative: 14 %
NEUTROS ABS: 4.7 10*3/uL (ref 1.7–7.7)
NEUTROS PCT: 76 %
Platelets: 220 10*3/uL (ref 150–400)
RBC: 3.2 MIL/uL — AB (ref 4.22–5.81)
RDW: 18.9 % — ABNORMAL HIGH (ref 11.5–15.5)
WBC: 6.2 10*3/uL (ref 4.0–10.5)

## 2015-07-15 LAB — MAGNESIUM: MAGNESIUM: 1.9 mg/dL (ref 1.7–2.4)

## 2015-07-15 MED ORDER — SENNOSIDES-DOCUSATE SODIUM 8.6-50 MG PO TABS
1.0000 | ORAL_TABLET | Freq: Every day | ORAL | Status: DC
  Administered 2015-07-16: 1 via ORAL
  Filled 2015-07-15 (×2): qty 1

## 2015-07-15 MED ORDER — MORPHINE SULFATE (CONCENTRATE) 10 MG/0.5ML PO SOLN
5.0000 mg | Freq: Four times a day (QID) | ORAL | Status: DC
  Administered 2015-07-15 – 2015-07-16 (×6): 5 mg via SUBLINGUAL
  Filled 2015-07-15 (×8): qty 0.5

## 2015-07-15 MED ORDER — DIPHENHYDRAMINE HCL 50 MG/ML IJ SOLN
25.0000 mg | Freq: Once | INTRAMUSCULAR | Status: DC
  Filled 2015-07-15: qty 1

## 2015-07-15 MED ORDER — DIPHENHYDRAMINE HCL 50 MG/ML IJ SOLN
12.5000 mg | Freq: Four times a day (QID) | INTRAMUSCULAR | Status: DC | PRN
Start: 2015-07-15 — End: 2015-07-17
  Administered 2015-07-15 – 2015-07-16 (×2): 12.5 mg via INTRAVENOUS
  Filled 2015-07-15 (×2): qty 1

## 2015-07-15 NOTE — Consult Note (Signed)
   [facility] CM Inpatient Consult   07/15/2015  Hector Sloan Jun 08, 1940 [id number] Patient screened for potential [place] Management services. Chart review reveals patient is being followed during this hospitalization with Hospice.  Patient is eligible for [facility] Care Management services, however this is not appropriate at this time.   Please for questions contact:   Natividad Brood, RN BSN [facility] Liaison  [phone] business mobile phone Toll free office [phone]

## 2015-07-15 NOTE — Progress Notes (Addendum)
Daily Progress Note   Patient Name: Hector Sloan       Date: 07/15/2015 DOB: 29-Apr-1940  Age: 75 y.o. MRN#: [id number] Attending Physician: Allie Bossier, MD Primary Care Physician: [name], MD Admit Date: 07/14/2015  Reason for Consultation/Follow-up: Disposition, Establishing goals of care, Inpatient hospice referral and Non pain symptom management  Subjective: Calm, dyspnea improved-still requiring PRN morphine and resting at night on BiPap. He remains confused. Interval Events: PMT consult 4/15 Length of Stay: 7 days  Current Medications: Scheduled Meds:  . brimonidine  1 drop Both Eyes Q12H   And  . timolol  1 drop Both Eyes Q12H  . citalopram  10 mg Oral Daily  . enoxaparin (LOVENOX) injection  40 mg Subcutaneous Q24H  . insulin aspart  0-20 Units Subcutaneous TID WC  . insulin aspart  0-5 Units Subcutaneous QHS  . ipratropium-albuterol  3 mL Nebulization QID  . metoprolol tartrate  37.5 mg Oral BID  . sodium chloride flush  3 mL Intravenous Q12H    Continuous Infusions: . sodium chloride 10 mL/hr at 07/12/15 2025    PRN Meds: acetaminophen **OR** acetaminophen, albuterol, LORazepam, morphine injection, morphine CONCENTRATE  Physical Exam: Physical Exam              Vital Signs: BP 167/74 mmHg  Pulse 105  Temp(Src) 98 F (36.7 C) (Axillary)  Resp 29  Ht _0  (1.88 m)  Wt 97.342 kg (214 lb 9.6 oz)  BMI 27.54 kg/m2  SpO2 93% SpO2: SpO2: 93 % O2 Device: O2 Device: Nasal Cannula O2 Flow Rate: O2 Flow Rate (L/min): 4 L/min  Intake/output summary:  Intake/Output Summary (Last 24 hours) at 07/15/15 1146 Last data filed at 07/14/15 1908  Gross per 24 hour  Intake    120 ml  Output    850 ml  Net   -730 ml   LBM: Last BM Date: 07/16/2015  (Patient reports having a BM "about Monday") Baseline Weight: Weight: 92.987 kg (205 lb) Most recent weight: Weight: 97.342 kg (214 lb 9.6 oz)       Palliative Assessment/Data: Flowsheet Rows        Most Recent Value   Intake Tab    Referral Department  Hospitalist   Unit at Time of Referral  Intermediate Care Unit   Palliative Care  Primary Diagnosis  Sepsis/Infectious Disease   Date Notified  07/11/15   Palliative Care Type  New Palliative care   Reason for referral  Clarify Goals of Care   Date of Admission  07/25/2015   Date first seen by Palliative Care  07/12/15   # of days Palliative referral response time  1 Day(s)   # of days IP prior to Palliative referral  3   Clinical Assessment    Palliative Performance Scale Score  30%   Dyspnea Max Last 24 Hours  4   Dyspnea Min Last 24 hours  2   Psychosocial & Spiritual Assessment    Palliative Care Outcomes    Patient/Family meeting held?  Yes   Who was at the meeting?  patient.  Then spoke with [name] on the phone.   Palliative Care Outcomes  Clarified goals of care, Counseled regarding hospice      Additional Data Reviewed: CBC    Component Value Date/Time   WBC 6.2 07/15/2015 0403   RBC 3.20* 07/15/2015 0403   RBC 3.26* 07/11/2015 1729   HGB 8.2* 07/15/2015 0403   HCT 27.1* 07/15/2015 0403   HCT 6.6* 04/17/2015 0453   PLT 220 07/15/2015 0403   MCV 84.7 07/15/2015 0403   MCH 25.6* 07/15/2015 0403   MCHC 30.3 07/15/2015 0403   RDW 18.9* 07/15/2015 0403   LYMPHSABS 0.5* 07/15/2015 0403   MONOABS 0.8 07/15/2015 0403   EOSABS 0.1 07/15/2015 0403   BASOSABS 0.0 07/15/2015 0403    CMP     Component Value Date/Time   NA 144 07/15/2015 0403   K 4.5 07/15/2015 0403   CL 111 07/15/2015 0403   CO2 24 07/15/2015 0403   GLUCOSE 117* 07/15/2015 0403   BUN 25* 07/15/2015 0403   CREATININE 2.01* 07/15/2015 0403   CALCIUM 8.7* 07/15/2015 0403   PROT 6.7 07/09/2015 0508   ALBUMIN 2.1* 07/09/2015 0508   AST 13*  07/09/2015 0508   ALT 11* 07/09/2015 0508   ALKPHOS 56 07/09/2015 0508   BILITOT 0.7 07/09/2015 0508   GFRNONAA 31* 07/15/2015 0403   GFRAA 35* 07/15/2015 0403       Problem List:  Patient Active Problem List   Diagnosis Date Noted  . Major depressive disorder, single episode 07/14/2015  . Uncontrolled type 2 diabetes mellitus with stage 3 chronic kidney disease, without long-term current use of insulin ([place])   . Essential hypertension   . Uncontrolled type 2 diabetes mellitus with complication ([place])   . Type 2 diabetes mellitus with diabetic nephropathy ([place])   . Dyspnea   . Palliative care encounter   . Goals of care, counseling/discussion   . Encounter for hospice care discussion   . Uncontrolled type 2 diabetes mellitus with diabetic nephropathy ([place])   . Pulmonary hypertension ([place])   . Acute respiratory failure with hypoxia ([place]) 07/02/2015  . Elevated lactic acid level 07/28/2015  . Left ventricular diastolic dysfunction, NYHA class 1 07/22/2015  . Moderate to severe pulmonary hypertension ([place]) 07/16/2015  . Leg edema, right 06/29/2015  . Hypercarbia 07/06/2015  . AKI (acute kidney injury) ([place]) 06/30/2015  . CKD (chronic kidney disease) 07/28/2015  . HCAP (healthcare-associated pneumonia) 05/09/2015  . DM (diabetes mellitus), type 2, uncontrolled, with renal complications ([place]) 56/31/4970  . Acute renal failure superimposed on stage 3 chronic kidney disease ([place]) 04/16/2015  . Normocytic anemia 04/16/2015     Palliative Care Assessment & Plan    1.Code Status:  DNR    Code  Status Orders        Start     Ordered   07/03/2015 2239  Limited resuscitation (code)   Continuous    Question Answer Comment  In the event of cardiac or respiratory ARREST: Perform CPR Yes   In the event of cardiac or respiratory ARREST: Perform Intubation/Mechanical Ventilation No   In the event of cardiac or respiratory ARREST: Perform Defibrillation or Cardioversion if indicated  Yes   Antiarrhythmic drugs - Any drug used to treat arrhythmias Yes   Vasoactive drug - Any drug used to stabilize blood pressure Yes      07/14/2015 2239    Code Status History    Date Active Date Inactive Code Status Order ID Comments User Context   07/26/2015  8:30 PM 07/03/2015 10:39 PM Full Code [id number]  Samella Parr, NP ED   07/13/2015  3:18 PM 06/29/2015  8:30 PM Partial Code [id number]  Samella Parr, NP ED   05/09/2015 10:33 PM 05/14/2015  5:49 PM Full Code [id number]  Rise Patience, MD Inpatient   04/16/2015  8:47 PM 04/19/2015  4:37 PM Full Code [id number]  Vianne Bulls, MD ED       2. Goals of Care/Additional Recommendations: I met with his niece Marlowe Kays who is gathering family this afternoon to discuss hospice as the most likely option for him. Goals are clearly comfort given his fragile condition and loss of functional status. His pulmonary disease is terminal. 3. Symptom Management:      1.Morphine- will start long acting MS Contin he has had total of 104m IV morphine PRN and 587moral- total 23 mg of oral morphine-still somewhat symptomatic but much improved. For now I will schedule 51m100m6 and see what PRN needs are and how he tolerates this dose.  4. Palliative Prophylaxis:   Aspiration, Bowel Regimen, Delirium Protocol, Eye Care, Frequent Pain Assessment and Oral Care  5. Prognosis: < 4 weeks  6. Discharge Planning:  Hosp[place]- residential vs. [email or web address] [facility] discussed with ConMarlowe Kayss neice  Thank you for allowing the Palliative Medicine Team to assist in the care of this patient.   Time In: 1100 Time Out: 1125 Total Time 25 Prolonged Time Billed no        EliAcquanetta ChainO  07/15/2015, 11:46 AM  Please contact Palliative Medicine Team phone at [phone] questions and concerns.

## 2015-07-15 NOTE — Progress Notes (Signed)
[facility] TEAM 1 - Stepdown/ICU TEAM Progress Note  Hector Sloan [id number] DOB: 1940-10-02 DOA: 07/01/2015 PCP: [name], MD  Admit HPI / Brief Narrative: 75 year old BM PMHx DM Type 2 on insulin, DM Nephropathy, CKD stage IV, Normocytic anemia, Chronic Diastolic CHF, Pulmonary Hypertension,   Discharged from this facility on 05/14/15 after admission for H. He was sent to a rehabilitation facility and he was recently discharged this past Monday. Apparently throughout the duration of his time at the facility he was requiring nasal cannula oxygen but this was rapidly weaned and he was discharged home on room air. Patient reports that about 3 days prior to discharge from rehabilitation he noticed having increased coughing and increasing shortness of breath and weakness but had not had any fevers. Symptoms persisted since discharge home. He presented to the ER because of the symptoms. He has not had any abdominal pain, nausea or vomiting. He is been working with therapy at home. He is not having chest pain or palpitations.   HPI/Subjective:  4/ 17 A/O  2 (does not know when, where ).    Assessment/Plan: Acute respiratory failure with hypoxia And hypercarbia /HCAP/Mesothelioma? -Continue decline in patient's respiratory status since admission. Chest CT; changes c/w parapneumonic effusion vs malignancy. Patient refuses invasive procedures. -Completed course Broad spectrum antibiotics. -Blood cultures, sputum culture, urinary legionella and strep, influenza panel negative -CT of chest; chest findings consistent with malignancy. Patient has refused biopsy   -Biopsy of LN, pleural-based nodules (Mesothelioma)? Patient has spoken with [facility] and refuses any further intervention.  -DuoNeb QID -Morphine 1-2 mg PRN air hunger -Patient requests home Hospice. On 4/15 discussed extensively case with [facility] do not believe patient is competent to make this  decision. Have requested Psychiatric evaluation for competency -Off BiPAP during the day unless patient has altered mental status. Continue morphine for feeling of air hunger -Benadryl PRN for itching , Acute renal failure superimposed on stage 3 chronic kidney disease  -Baseline renal function BUN 35 and creatinine 2.0 none -Normal saline at [facility] -At baseline   DM, type 2, uncontrolled, with renal complications -Currently uncontrolled with CBGs greater than 33 -Metformin DC'd  -Given stage IV chronic kidney disease would not resume metformin -4/10 Hemoglobin A1c= 7.4 -Resistant SSI  Leg edema, right -Negative DVT/SVT     Normocytic anemia(Baseline hemoglobin between 7 and 8) -Suspect anemia related to patient's chronic kidney disease -Anemia panel pending   Left ventricular diastolic dysfunction, NYHA class 1/Moderate to severe pulmonary hypertension  -No evidence of acute heart failure based on current symptomatology -It's presented with hypoxemia or completeness of evaluation will check BNP -Strict in and out since admission + 3.4 L -Daily weight Filed Weights   07/13/15 0500 07/14/15 0417 07/15/15 0500  Weight: 102.513 kg (226 lb) 98.476 kg (217 lb 1.6 oz) 97.342 kg (214 lb 9.6 oz)   -Transfuse for hemoglobin<8 -4/11 transfused 1 unit PRBC  Essential hypertension -Metoprolol 37.5 mg BID   Goals of care -Consulted palliative Care;Patient with poor respiratory status possible mesothelioma; requested [facility] biopsy however patient does not want invasive procedures. Long-term vs short-term goals of care; full DO NOT RESUSCITATE; hospice?   Code Status: DO NOT INTUBATE Family Communication: no family present at time of exam Disposition Plan: Patient requests home Hospice    Consultants: Dr.Wesam Kathryne Sharper [place]  Procedure/Significant Events: 4/10 CT chest WO contrast;-Persistent extensive pleural and parenchymal lung disease.  Pleural tumor (mesothelioma)? -  Extensive pleural calcifications; previous trauma/ hemorrhage infection, pleurodesis or asbestos related pleural disease?. -Complex fluid collections major fissure and bilateral pleural thickening. -Nodular opacities at both lung bases could reflect rounded atelectasis or tumor. -Extensive lung disease.-Stable borderline enlarged mediastinal lymph nodes 411 right lower extremity Doppler; negative DVT/SVT   Culture 4/10 MRSA by PCR positive 4/10 influenza A/B/H1N1 negative 4/10 HIV negative 4/11Right arm/hand NGTD 4/11 respiratory virus panel negative    Antibiotics: Zosyn 4/10 >> 4/16 Vancomycin 4/10 >> 4/16   DVT prophylaxis: Lovenox   Devices    LINES / TUBES:      Continuous Infusions: . sodium chloride 10 mL/hr at 07/12/15 2025    Objective: VITAL SIGNS: Temp: 98.1 F (36.7 C) (04/16 2307) Temp Source: Oral (04/16 2307) BP: 180/100 mmHg (04/17 0800) Pulse Rate: 101 (04/17 0800) SPO2; FIO2:   Intake/Output Summary (Last 24 hours) at 07/15/15 0839 Last data filed at 07/14/15 1908  Gross per 24 hour  Intake    120 ml  Output    850 ml  Net   -730 ml     Exam: General: A/O  2 (does not know when, where ). , positive acute respiratory distress, Eyes: Negative headache, negative scleral hemorrhage ENT: Negative Runny nose, negative gingival bleeding, Neck:  Negative scars, masses, torticollis, lymphadenopathy, JVD Lungs: diffuse poor air movement, positive expiratory wheezes negative crackles Cardiovascular: Regular rhythm and rate without murmur gallop or rub normal S1 and S2 Abdomen:negative abdominal pain, nondistended, positive soft, bowel sounds, no rebound, no ascites, no appreciable mass Extremities: No significant cyanosis, clubbing, or edema bilateral lower extremities Psychiatric:  Negative depression, negative anxiety, negative fatigue, negative mania  Neurologic:  Cranial nerves II through XII intact,  tongue/uvula midline, all extremities muscle strength 5/5, sensation intact throughout, negative dysarthria, negative expressive aphasia, negative receptive aphasia.   Data Reviewed: Basic Metabolic Panel:  Recent Labs Lab 07/09/15 0508 07/11/15 0339 07/13/15 0514 07/14/15 0426 07/15/15 0403  NA 142 142 145 142 144  K 4.5 4.3 4.9 4.4 4.5  CL 111 111 114* 111 111  CO2 22 20* 24 22 24   GLUCOSE 105* 106* 136* 128* 117*  BUN 34* 28* 28* 25* 25*  CREATININE 2.53* 2.16* 2.03* 1.97* 2.01*  CALCIUM 8.4* 8.5* 8.6* 8.6* 8.7*  MG  --   --  1.9 1.8 1.9   Liver Function Tests:  Recent Labs Lab 07/09/15 0508  AST 13*  ALT 11*  ALKPHOS 56  BILITOT 0.7  PROT 6.7  ALBUMIN 2.1*   No results for input(s): LIPASE, AMYLASE in the last 168 hours. No results for input(s): AMMONIA in the last 168 hours. CBC:  Recent Labs Lab 07/09/15 0508 07/11/15 0339 07/13/15 0514 07/14/15 0426 07/15/15 0403  WBC 5.2 5.6 6.4 5.9 6.2  NEUTROABS  --   --  5.2 5.0 4.7  HGB 7.1* 8.6* 8.6* 8.2* 8.2*  HCT 22.1* 27.8* 28.2* 26.9* 27.1*  MCV 81.3 82.7 84.4 84.9 84.7  PLT 215 214 221 221 220   Cardiac Enzymes: No results for input(s): CKTOTAL, CKMB, CKMBINDEX, TROPONINI in the last 168 hours. BNP (last 3 results)  Recent Labs  05/09/15 1815 05/11/15 0203 07/27/2015 1714  BNP 132.0* 95.2 312.0*    ProBNP (last 3 results) No results for input(s): PROBNP in the last 8760 hours.  CBG:  Recent Labs Lab 07/13/15 2029 07/14/15 0732 07/14/15 1230 07/14/15 1545 07/14/15 2101  GLUCAP 85 116* 183* 193* 141*    Recent Results (from the past 240 hour(s))  MRSA PCR Screening     Status: Abnormal   Collection Time: 07/02/2015  8:55 PM  Result Value Ref Range Status   MRSA by PCR POSITIVE (A) NEGATIVE Final    Comment:        The GeneXpert MRSA Assay (FDA approved for NASAL specimens only), is one component of a comprehensive MRSA colonization surveillance program. It is not intended to  diagnose MRSA infection nor to guide or monitor treatment for MRSA infections. RESULT CALLED TO, READ BACK BY AND VERIFIED WITH: L BUNN,RN@[id number]  07/28/2015 MKELLY   Blood culture (routine x 2)     Status: None   Collection Time: 07/09/15 12:05 AM  Result Value Ref Range Status   Specimen Description BLOOD RIGHT ARM  Final   Special Requests BOTTLES DRAWN AEROBIC AND ANAEROBIC 5CC  Final   Culture NO GROWTH 5 DAYS  Final   Report Status 07/14/2015 FINAL  Final  Blood culture (routine x 2)     Status: None   Collection Time: 07/09/15 12:34 AM  Result Value Ref Range Status   Specimen Description BLOOD RIGHT HAND  Final   Special Requests IN PEDIATRIC BOTTLE 3CC  Final   Culture NO GROWTH 5 DAYS  Final   Report Status 07/14/2015 FINAL  Final  Respiratory virus panel     Status: None   Collection Time: 07/09/15  9:46 AM  Result Value Ref Range Status   Respiratory Syncytial Virus A Negative Negative Final   Respiratory Syncytial Virus B Negative Negative Final   Influenza A Negative Negative Final   Influenza B Negative Negative Final   Parainfluenza 1 Negative Negative Final   Parainfluenza 2 Negative Negative Final   Parainfluenza 3 Negative Negative Final   Metapneumovirus Negative Negative Final   Rhinovirus Negative Negative Final   Adenovirus Negative Negative Final    Comment: (NOTE) Performed At: [facility] [place], [place] [id number] Lindon Romp MD [id number]      Studies:  Recent x-ray studies have been reviewed in detail by the Attending Physician  Scheduled Meds:  Scheduled Meds: . brimonidine  1 drop Both Eyes Q12H   And  . timolol  1 drop Both Eyes Q12H  . citalopram  10 mg Oral Daily  . enoxaparin (LOVENOX) injection  40 mg Subcutaneous Q24H  . insulin aspart  0-20 Units Subcutaneous TID WC  . insulin aspart  0-5 Units Subcutaneous QHS  . ipratropium-albuterol  3 mL Nebulization QID  . metoprolol tartrate  37.5 mg Oral  BID  . sodium chloride flush  3 mL Intravenous Q12H    Time spent on care of this patient: 40 mins   WOODS, Geraldo Docker , MD  [facility] Office  [phone] Pager - [phone]  On-Call/Text Page:      [email or web address]      password [email or web address]  If 7PM-7AM, please contact night-coverage [email or web address] Password [email or web address] 07/15/2015, 8:39 AM   LOS: 7 days   Care during the described time interval was provided by me .  I have reviewed this patient's available data, including medical history, events of note, physical examination, and all test results as part of my evaluation. I have personally reviewed and interpreted all radiology studies.   Dia Crawford, MD [phone] Pager

## 2015-07-15 NOTE — Progress Notes (Signed)
Physical Therapy Treatment Patient Details Name: Hector Sloan MRN: [id number] DOB: [birth year] Today's Date: 07/15/2015    History of Present Illness 75 year old male patient with a past medical history of diabetes on insulin, diabetic nephropathy with chronic kidney disease stage IV, normocytic anemia, grade 1 diastolic dysfunction with mild focal basal hypertrophy based on echocardiogram previous admission, preserved LV function, severely elevated pulmonary pressures with mild TR also noted on echo last admission. He was discharged from this facility on 05/14/15 after admission for H. He was sent to a rehabilitation facility and he was recently discharged this past Monday. Apparently throughout the duration of his time at the facility he was requiring nasal cannula oxygen but this was rapidly weaned and he was discharged home on room air. Patient reports that about 3 days prior to discharge from rehabilitation he noticed having increased coughing and increasing shortness of breath and weakness but had not had any fevers. Symptoms persisted since discharge home. He presented to the ER because of the symptoms.     PT Comments    Pt has significantly declined since last PT visit. Pt required Max to total assistance of 2 with functional mobility today and was unable to stand. He also was very inconsistent with following commands and moaning throughout treatment. Pt still needs 50% venturi mask to prevent desaturation during activity but remains on 6L [place] at rest. Pt will continue to benefit from PT services to improve functional mobility. Pt continues to think that he can go home alone from the hospital but needs quite a bit of assistance for mobility. Continue to recommends SNF at this time.    Follow Up Recommendations  SNF;Supervision/Assistance - 24 hour     Equipment Recommendations  None recommended by PT    Recommendations for Other Services OT consult     Precautions / Restrictions  Precautions Precautions: Fall Required Braces or Orthoses: Other Brace/Splint Other Brace/Splint: L BK prosthesis Restrictions Weight Bearing Restrictions: No    Mobility  Bed Mobility Overal bed mobility: Needs Assistance;+2 for physical assistance Bed Mobility: Supine to Sit     Supine to sit: +2 for physical assistance;HOB elevated;Max assist     General bed mobility comments: Pt able to bring his legs off the bed, but needed Max A +2 to bring his trunk upright.   Transfers Overall transfer level: Needs assistance Equipment used: Rolling walker (2 wheeled) Transfers: Sit to/from Stand;Lateral/Scoot Transfers Sit to Stand: +2 physical assistance;Total assist (Pt unable to get into standing. )        Lateral/Scoot Transfers: Max assist;+2 physical assistance;From elevated surface General transfer comment: Pt would not help therapists to be able to stand, then was resisting movement for lateral scoot transfer to drop arm recliner.   Ambulation/Gait             General Gait Details: pt unable to stand at this time.    Stairs            Wheelchair Mobility    Modified Rankin (Stroke Patients Only)       Balance Overall balance assessment: Needs assistance Sitting-balance support: Bilateral upper extremity supported;Feet supported Sitting balance-Leahy Scale: Poor Sitting balance - Comments: Pt able to sit EOB with supervsion but needed Min A occasionally to pull himself back upright Postural control: Posterior lean                          Cognition Arousal/Alertness: Lethargic Behavior During Therapy: Flat affect  Overall Cognitive Status: Impaired/Different from baseline Area of Impairment: Orientation;Memory;Following commands;Safety/judgement;Awareness;Problem solving;Attention Orientation Level: Place;Situation;Time Current Attention Level: Focused Memory: Decreased recall of precautions;Decreased short-term memory Following Commands:  Follows one step commands inconsistently Safety/Judgement: Decreased awareness of safety;Decreased awareness of deficits Awareness: Intellectual Problem Solving: Decreased initiation;Difficulty sequencing;Requires verbal cues;Requires tactile cues General Comments: Pt needed a lot more cues than normal today. Pt able to tell me the year but unable to tell me the month. Pt did not know where he was or why he was here. He did not help with the transfer at all and somewhat perseverating on someone rubbing his back becuase it itches.     Exercises      General Comments General comments (skin integrity, edema, etc.): Pt not very cooperative with therapy today. Pt very lethargic and not following commands consistently.       Pertinent Vitals/Pain Pain Assessment: No/denies pain    Home Living                      Prior Function            PT Goals (current goals can now be found in the care plan section) Acute Rehab PT Goals Patient Stated Goal: to get stronger to be able to go home PT Goal Formulation: With patient Time For Goal Achievement: 07/24/15 Potential to Achieve Goals: Good Progress towards PT goals: Progressing toward goals    Frequency  Min 2X/week    PT Plan Discharge plan needs to be updated    Co-evaluation             End of Session Equipment Utilized During Treatment: Gait belt;Oxygen Activity Tolerance: Patient limited by lethargy;Treatment limited secondary to medical complications (Comment) Patient left: in chair;with call bell/phone within reach;with chair alarm set     Time: 1000-1026 PT Time Calculation (min) (ACUTE ONLY): 26 min  Charges:  $Therapeutic Activity: 23-37 mins                    G Codes:      [place], SPT Colon Branch 07/15/2015, 11:32 AM

## 2015-07-16 ENCOUNTER — Inpatient Hospital Stay (HOSPITAL_COMMUNITY): Payer: Medicare Other

## 2015-07-16 DIAGNOSIS — Z515 Encounter for palliative care: Secondary | ICD-10-CM

## 2015-07-16 LAB — GLUCOSE, CAPILLARY
Glucose-Capillary: 91 mg/dL (ref 65–99)
Glucose-Capillary: 162 mg/dL — ABNORMAL HIGH (ref 65–99)
Glucose-Capillary: 130 mg/dL — ABNORMAL HIGH (ref 65–99)
Glucose-Capillary: 158 mg/dL — ABNORMAL HIGH (ref 65–99)

## 2015-07-16 NOTE — Progress Notes (Signed)
CSW informed by MD of pt family decision for residential hospice placement- family prefers [facility] made referral to [facility]- under review  Domenica Reamer, [place] Social Worker [phone]

## 2015-07-16 NOTE — Progress Notes (Signed)
Nutrition Brief Note  Pt identified on the </= 12 Braden score report. Pt goal of care is comfort measures. No nutrition interventions warranted at this time.  Please consult as needed.   Arthur Holms, RD, LDN Pager #: [phone] After-Hours Pager #: [phone]

## 2015-07-16 NOTE — Progress Notes (Signed)
[place] [facility] TEAM 1 - Stepdown/ICU TEAM Progress Note  Hector Sloan [id number] DOB: 09-Oct-1940 DOA: 07/01/2015 PCP: [name], MD  Admit HPI / Brief Narrative: 75 year old BM PMHx DM Type 2 on insulin, DM Nephropathy, CKD stage IV, Normocytic anemia, Chronic Diastolic CHF, Pulmonary Hypertension,   Discharged from this facility on 05/14/15 after admission for H. He was sent to a rehabilitation facility and he was recently discharged this past Monday. Apparently throughout the duration of his time at the facility he was requiring nasal cannula oxygen but this was rapidly weaned and he was discharged home on room air. Patient reports that about 3 days prior to discharge from rehabilitation he noticed having increased coughing and increasing shortness of breath and weakness but had not had any fevers. Symptoms persisted since discharge home. He presented to the ER because of the symptoms. He has not had any abdominal pain, nausea or vomiting. He is been working with therapy at home. He is not having chest pain or palpitations.   HPI/Subjective:  4/ 18 A/O  2 (does not know when, where ). Patient now more coherent. Family in room and patient did not appear to wish to answer questions concerning his current condition.    Assessment/Plan: Acute respiratory failure with hypoxia And hypercarbia /HCAP/Mesothelioma? -Continue decline in patient's respiratory status since admission. Chest CT; changes c/w parapneumonic effusion vs malignancy. Patient refuses invasive procedures. -Completed course Broad spectrum antibiotics. -Blood cultures, sputum culture, urinary legionella and strep, influenza panel negative -CT of chest; chest findings consistent with malignancy. Patient has refused biopsy   -Biopsy of LN, pleural-based nodules (Mesothelioma)? Patient has spoken with [facility] and refuses any further intervention.  -DuoNeb QID -Morphine 1-2 mg PRN air hunger -Patient is not competent to  make medical decisions. -4/18 meeting with family; have decided patient will go to inpatient hospice.Social work working on placement  Altered mental status -Most likely multifactorial to include HCAP, acute respiratory failure with hypoxia, possible metastatic neoplasm. -Obtain head CT, determine if metastatic disease present.-Family still all in agreement on inpatient hospice, however would like to know if there is metastatic disease.  Acute renal failure superimposed on stage 3 chronic kidney disease  -Baseline renal function BUN 35 and creatinine 2.0 none -Normal saline at [facility] -At baseline   DM, type 2, uncontrolled, with renal complications -Currently uncontrolled with CBGs greater than 33 -Metformin DC'd  -Given stage IV chronic kidney disease would not resume metformin -4/10 Hemoglobin A1c= 7.4 -Resistant SSI  Leg edema, right -Negative DVT/SVT     Normocytic anemia(Baseline hemoglobin between 7 and 8) -Suspect anemia related to patient's chronic kidney disease -Anemia panel pending   Left ventricular diastolic dysfunction, NYHA class 1/Moderate to severe pulmonary hypertension  -No evidence of acute heart failure based on current symptomatology -It's presented with hypoxemia or completeness of evaluation will check BNP -Strict in and out since admission + 2.7 L -Daily weight Filed Weights   07/14/15 0417 07/15/15 0500 07/16/15 0335  Weight: 98.476 kg (217 lb 1.6 oz) 97.342 kg (214 lb 9.6 oz) 97.297 kg (214 lb 8 oz)  -Transfuse for hemoglobin<8 -4/11 transfused 1 unit PRBC  Essential hypertension -Metoprolol 37.5 mg BID   Goals of care -Consulted palliative Care;Patient with poor respiratory status possible mesothelioma; requested [facility] biopsy however patient does not want invasive procedures. Long-term vs short-term goals of care; full DO NOT RESUSCITATE; hospice?   Code Status: DO NOT INTUBATE Family Communication: no family present at time of  exam Disposition  Plan:all family in agreement patient to go to inpatient hospice    Consultants: Dr.Wesam Kathryne Sharper Carleton  Procedure/Significant Events: 4/10 CT chest WO contrast;-Persistent extensive pleural and parenchymal lung disease. Pleural tumor (mesothelioma)? -Extensive pleural calcifications; previous trauma/ hemorrhage infection, pleurodesis or asbestos related pleural disease?. -Complex fluid collections major fissure and bilateral pleural thickening. -Nodular opacities at both lung bases could reflect rounded atelectasis or tumor. -Extensive lung disease.-Stable borderline enlarged mediastinal lymph nodes 411 right lower extremity Doppler; negative DVT/SVT   Culture 4/10 MRSA by PCR positive 4/10 influenza A/B/H1N1 negative 4/10 HIV negative 4/11Right arm/hand NGTD 4/11 respiratory virus panel negative    Antibiotics: Zosyn 4/10 >> 4/16 Vancomycin 4/10 >> 4/16   DVT prophylaxis: Lovenox   Devices    LINES / TUBES:      Continuous Infusions: . sodium chloride 10 mL/hr at 07/12/15 2025    Objective: VITAL SIGNS: Temp: 97.5 F (36.4 C) (04/18 0751) Temp Source: Oral (04/18 0751) BP: 160/90 mmHg (04/18 0919) Pulse Rate: 102 (04/18 0926) SPO2; FIO2:   Intake/Output Summary (Last 24 hours) at 07/16/15 1317 Last data filed at 07/16/15 1000  Gross per 24 hour  Intake    483 ml  Output    650 ml  Net   -167 ml     Exam: General: A/O  2 (does not know when, where )although more alert. , positive acute respiratory distress, Eyes: Negative headache, negative scleral hemorrhage ENT: Negative Runny nose, negative gingival bleeding, Neck:  Negative scars, masses, torticollis, lymphadenopathy, JVD Lungs: diffuse poor air movement, positive expiratory wheezes negative crackles Cardiovascular: Regular rhythm and rate without murmur gallop or rub normal S1 and S2 Abdomen:negative abdominal pain, nondistended,  positive soft, bowel sounds, no rebound, no ascites, no appreciable mass Extremities: No significant cyanosis, clubbing, or edema bilateral lower extremities Psychiatric:  Negative depression, negative anxiety, negative fatigue, negative mania  Neurologic:  Cranial nerves II through XII intact, tongue/uvula midline, all extremities muscle strength 5/5, sensation intact throughout, negative dysarthria, negative expressive aphasia, negative receptive aphasia.   Data Reviewed: Basic Metabolic Panel:  Recent Labs Lab 07/11/15 0339 07/13/15 0514 07/14/15 0426 07/15/15 0403  NA 142 145 142 144  K 4.3 4.9 4.4 4.5  CL 111 114* 111 111  CO2 20* 24 22 24   GLUCOSE 106* 136* 128* 117*  BUN 28* 28* 25* 25*  CREATININE 2.16* 2.03* 1.97* 2.01*  CALCIUM 8.5* 8.6* 8.6* 8.7*  MG  --  1.9 1.8 1.9   Liver Function Tests: No results for input(s): AST, ALT, ALKPHOS, BILITOT, PROT, ALBUMIN in the last 168 hours. No results for input(s): LIPASE, AMYLASE in the last 168 hours. No results for input(s): AMMONIA in the last 168 hours. CBC:  Recent Labs Lab 07/11/15 0339 07/13/15 0514 07/14/15 0426 07/15/15 0403  WBC 5.6 6.4 5.9 6.2  NEUTROABS  --  5.2 5.0 4.7  HGB 8.6* 8.6* 8.2* 8.2*  HCT 27.8* 28.2* 26.9* 27.1*  MCV 82.7 84.4 84.9 84.7  PLT 214 221 221 220   Cardiac Enzymes: No results for input(s): CKTOTAL, CKMB, CKMBINDEX, TROPONINI in the last 168 hours. BNP (last 3 results)  Recent Labs  05/09/15 1815 05/11/15 0203 07/18/2015 1714  BNP 132.0* 95.2 312.0*    ProBNP (last 3 results) No results for input(s): PROBNP in the last 8760 hours.  CBG:  Recent Labs Lab 07/15/15 1643 07/15/15 2006 07/15/15 2202 07/16/15 0748 07/16/15 1303  GLUCAP 112* 120* 100* 91 162*  Recent Results (from the past 240 hour(s))  MRSA PCR Screening     Status: Abnormal   Collection Time: 07/01/2015  8:55 PM  Result Value Ref Range Status   MRSA by PCR POSITIVE (A) NEGATIVE Final    Comment:         The GeneXpert MRSA Assay (FDA approved for NASAL specimens only), is one component of a comprehensive MRSA colonization surveillance program. It is not intended to diagnose MRSA infection nor to guide or monitor treatment for MRSA infections. RESULT CALLED TO, READ BACK BY AND VERIFIED WITH: L BUNN,RN@[id number]  07/06/2015 MKELLY   Blood culture (routine x 2)     Status: None   Collection Time: 07/09/15 12:05 AM  Result Value Ref Range Status   Specimen Description BLOOD RIGHT ARM  Final   Special Requests BOTTLES DRAWN AEROBIC AND ANAEROBIC 5CC  Final   Culture NO GROWTH 5 DAYS  Final   Report Status 07/14/2015 FINAL  Final  Blood culture (routine x 2)     Status: None   Collection Time: 07/09/15 12:34 AM  Result Value Ref Range Status   Specimen Description BLOOD RIGHT HAND  Final   Special Requests IN PEDIATRIC BOTTLE 3CC  Final   Culture NO GROWTH 5 DAYS  Final   Report Status 07/14/2015 FINAL  Final  Respiratory virus panel     Status: None   Collection Time: 07/09/15  9:46 AM  Result Value Ref Range Status   Respiratory Syncytial Virus A Negative Negative Final   Respiratory Syncytial Virus B Negative Negative Final   Influenza A Negative Negative Final   Influenza B Negative Negative Final   Parainfluenza 1 Negative Negative Final   Parainfluenza 2 Negative Negative Final   Parainfluenza 3 Negative Negative Final   Metapneumovirus Negative Negative Final   Rhinovirus Negative Negative Final   Adenovirus Negative Negative Final    Comment: (NOTE) Performed At: [facility] [place], [place] [id number] Lindon Romp MD [id number]      Studies:  Recent x-ray studies have been reviewed in detail by the Attending Physician  Scheduled Meds:  Scheduled Meds: . brimonidine  1 drop Both Eyes Q12H   And  . timolol  1 drop Both Eyes Q12H  . citalopram  10 mg Oral Daily  . diphenhydrAMINE  25 mg Intravenous Once  . enoxaparin (LOVENOX)  injection  40 mg Subcutaneous Q24H  . insulin aspart  0-20 Units Subcutaneous TID WC  . insulin aspart  0-5 Units Subcutaneous QHS  . ipratropium-albuterol  3 mL Nebulization QID  . metoprolol tartrate  37.5 mg Oral BID  . morphine CONCENTRATE  5 mg Sublingual QID  . senna-docusate  1 tablet Oral QHS  . sodium chloride flush  3 mL Intravenous Q12H    Time spent on care of this patient: 40 mins   Primo Innis, Geraldo Docker , MD  [facility] Office  [phone] Pager - [phone]  On-Call/Text Page:      [email or web address]      password [email or web address]  If 7PM-7AM, please contact night-coverage [email or web address] Password [email or web address] 07/16/2015, 1:17 PM   LOS: 8 days   Care during the described time interval was provided by me .  I have reviewed this patient's available data, including medical history, events of note, physical examination, and all test results as part of my evaluation. I have personally reviewed and interpreted all radiology studies.   Dia Crawford, MD [phone] Pager

## 2015-07-17 LAB — GLUCOSE, CAPILLARY
GLUCOSE-CAPILLARY: 177 mg/dL — AB (ref 65–99)
GLUCOSE-CAPILLARY: 138 mg/dL — AB (ref 65–99)

## 2015-07-17 MED ORDER — BIOTENE DRY MOUTH MT LIQD: 15.0000 mL | OROMUCOSAL | Status: DC | PRN

## 2015-07-17 MED ORDER — GLYCOPYRROLATE 0.2 MG/ML IJ SOLN
0.2000 mg | INTRAMUSCULAR | Status: DC | PRN
  Filled 2015-07-17: qty 1

## 2015-07-17 MED ORDER — ONDANSETRON 4 MG PO TBDP
4.0000 mg | ORAL_TABLET | Freq: Four times a day (QID) | ORAL | Status: DC | PRN
  Filled 2015-07-17: qty 1

## 2015-07-17 MED ORDER — HALOPERIDOL 0.5 MG PO TABS
0.5000 mg | ORAL_TABLET | ORAL | Status: DC | PRN
  Filled 2015-07-17: qty 1

## 2015-07-17 MED ORDER — ONDANSETRON HCL 4 MG/2ML IJ SOLN: 4.0000 mg | Freq: Four times a day (QID) | INTRAMUSCULAR | Status: DC | PRN

## 2015-07-17 MED ORDER — HYDROMORPHONE BOLUS VIA INFUSION
0.5000 mg | INTRAVENOUS | Status: DC | PRN
  Filled 2015-07-17: qty 1

## 2015-07-17 MED ORDER — GLYCOPYRROLATE 1 MG PO TABS
1.0000 mg | ORAL_TABLET | ORAL | Status: DC | PRN
  Filled 2015-07-17: qty 1

## 2015-07-17 MED ORDER — POLYVINYL ALCOHOL 1.4 % OP SOLN
1.0000 [drp] | Freq: Four times a day (QID) | OPHTHALMIC | Status: DC | PRN
  Filled 2015-07-17: qty 15

## 2015-07-17 MED ORDER — SODIUM CHLORIDE 0.9 % IV SOLN
0.2500 mg/h | INTRAVENOUS | Status: DC
  Administered 2015-07-17 (×2): 0.25 mg/h via INTRAVENOUS
  Filled 2015-07-17: qty 5

## 2015-07-17 MED ORDER — ACETAMINOPHEN 650 MG RE SUPP: 650.0000 mg | Freq: Four times a day (QID) | RECTAL | Status: DC | PRN

## 2015-07-17 MED ORDER — SODIUM CHLORIDE 0.9 % IV SOLN
INTRAVENOUS | Status: DC
  Administered 2015-07-17: 14:00:00 via INTRAVENOUS

## 2015-07-17 MED ORDER — HALOPERIDOL LACTATE 2 MG/ML PO CONC
0.5000 mg | ORAL | Status: DC | PRN
  Filled 2015-07-17: qty 0.3

## 2015-07-17 MED ORDER — ACETAMINOPHEN 325 MG PO TABS: 650.0000 mg | ORAL_TABLET | Freq: Four times a day (QID) | ORAL | Status: DC | PRN

## 2015-07-17 MED ORDER — HALOPERIDOL LACTATE 5 MG/ML IJ SOLN: 0.5000 mg | INTRAMUSCULAR | Status: DC | PRN

## 2015-07-29 NOTE — Progress Notes (Signed)
Met with patient and family. They desire full comfort care in setting of what appears to be lung cancer, possible mesothelioma with severe chronic respiratory failure, recurrent effusion.  Recommended residential hospice facility and they are all in agreement. Now DNR and will begin to transition off stepdown and minimize medications.  Will follow.  Lane Hacker, DO Palliative Medicine

## 2015-07-29 NOTE — Progress Notes (Signed)
Pt was noted to be in resp. distress with O2 sats in the 70's and RR of 5-10 at 0045.  RT was called to the bedside and placed the pt back on BiPAP.  At that time O2 sats returned to the 90-100 range, however the pts RR did not improve.  At 65 this RN attempted to call the patients niece at the number listed in his contact information.  She was not reachable by phone and a message was left describing the pts deterioration in condition.  Immediately following, this RN called the number listed as the pts friend [name], who is also listed in his contact list, and was successful in speaking with her. She stated that she would be making her way to the hospital and that she would attempt to call other family members to make them aware of the patients condition. There were no other contact numbers listed for this pt.  Will continue to monitor.

## 2015-07-29 NOTE — Progress Notes (Addendum)
Hector Sloan had a rapid decline last PM after having a great day with his entire family in the room with him -they came for a family meeting about his condition and goals of care. He was very clear mentally yesterday and was eating well. Last PM he had acute respiratory failure and was left on bipap this morning until family could arrive. He is currently unresponsive. Worsening dyspnea through out the evening and received PRN Roxanol. Suspect worsening effusions based on exam and very poor air movement.  I met with family again and confirmed a desire for comfort measures. I recommended removal of Bipap and initiation of a low dose morphine infusion- I do not believe he will leave the hospital at this point. He does not have a reversible illness and we are prolonging his death at this point.  Waiting on arrival of patients significant other Hector Sloan to arriver. Will proceed with RMV. Will start low dose Morphine infusion for comfort.  Time: 35 minutes (12:30- 1:05) Lane Hacker, [place]

## 2015-07-29 NOTE — Progress Notes (Signed)
PT Cancellation Note  Patient Details Name: Hector Sloan MRN: [id number] DOB: 26-May-1940   Cancelled Treatment:    Reason Eval/Treat Not Completed: Medical issues which prohibited therapy. Pt's respiratory status has declined over night. Pt placed back on BiPap, RN stated to hold today. Will follow up tomorrow.   Colon Branch, SPT Colon Branch 08/09/15, 9:34 AM

## 2015-07-29 NOTE — Progress Notes (Signed)
RT took patient off Bipap and placed him on Benjamin 2 Lpm with humidity. Family at bedside. RN notified.

## 2015-07-29 NOTE — Progress Notes (Signed)
   July 25, 2015 1300  Clinical Encounter Type  Visited With Family  Visit Type Patient actively dying  Referral From Chaplain  Spiritual Encounters  Spiritual Needs Emotional  Stress Factors  Family Stress Factors Loss  At request of chaplain stopped by room where patient's life support is going to be removed. Family pastor had just left, so family did not feel need at that moment for prayers. Chaplain did stay and talk to the family for some time about the patient. Also spoke to [facility] doctor and requested to be notified when life support actually removed.

## 2015-07-29 NOTE — Progress Notes (Signed)
Pts monitor rang asystole this evening at approximately 1940. Upon entering the room this RN noted that the patient was taking agonal breaths.  At this time the pts HR and rhythm were absent on the monitor.  After a few more agonal breaths the pts breathing stopped.  At this time this RN and fellow RN Reynolds Bowl performed  vital signs checks. No breath sounds were audible on auscultation, no carotid pulses were palpable, and no heart sounds were auscultated at the apex.  Time of death was called at 42.  Pts belongings were sent home with the family as noted in the post-mortem checklist.  This patient was a DNR and on comfort care at the time of his passing.  The patients diaudid drip was discontinued and a volume of 88.13mL was disposed of in the sink by this RN and witnessed by Reynolds Bowl as well.

## 2015-07-29 NOTE — Progress Notes (Signed)
[facility] TEAM 1 - Stepdown/ICU TEAM PROGRESS NOTE  Hector Sloan [id number] DOB: 1941-03-19 DOA: 07/23/2015 PCP: [name], MD  Admit HPI / Brief Narrative: 75 year old M Hx DM2 on insulin, CKD stage IV, Normocytic anemia, Chronic Diastolic CHF, and pulmonary Hypertension who was discharged from this facility on 05/14/15 after admission for PNA. He was sent to a rehabilitation facility and eventually discharged home. Apparently throughout the duration of his time at the facility he was requiring nasal cannula oxygen but this was rapidly weaned and he was discharged home on room air. Patient reported that 3 days prior to discharge from rehabilitation he developed increased coughing and increasing shortness of breath and weakness.   HPI/Subjective: Pt has been transitioned to comfort directed care.  I have had a discussion w/ Dr. Hilma Favors, who has been attending to the pt and has met w/ the family multiple times today.  In such, I do not feel I have anything to add to his care at this time, and have not seen him today.    Assessment/Plan:  Pneumonia - Acute respiratory failure with hypoxia and hypercarbia - Possible mesothelioma CT chest revealed extensive pleural/parenchymal lung disease with extensive pleural calcifications as well as complex fluid collections in the major fissure and bilateral pleural thickening, as well as nodular opacities at both lung bases - the pt was adamantly opposed to bx or further aggressive investigation - the plan at this time is to focus on comfort, with Palliative Care directing this phase of his care   Acute renal failure superimposed on stage 3 CKD Baseline creatinine 2.0   DM2, uncontrolled, with renal complications 8/67 Y1P 7.4    R LE edema Negative DVT / SVT   Chronic Normocytic anemia baseline hemoglobin 7-8 - related to patient's chronic kidney disease - no evidence of acute blood loss   Left ventricular diastolic dysfunction (grade  1) - Moderate to severe pulmonary hypertension   MRSA screen +  Code Status: DNR - NO CODE BLUE - COMFORT CARE ONLY  Family Communication:  Disposition Plan: hospital death expected   Consultants: PCCM Palliative Care   Procedures: 4/11 right lower extremity Doppler - negative DVT/SVT  Antibiotics: Zosyn 4/10 > 4/15 Vancomycin 4/10 > 4/15  DVT prophylaxis: Comfort care only   Objective: Blood pressure 100/54, pulse 60, temperature 96.6 F (35.9 C), temperature source Axillary, resp. rate 2, height _0  (1.88 m), weight 101.288 kg (223 lb 4.8 oz), SpO2 100 %.  Intake/Output Summary (Last 24 hours) at 07/22/2015 1450 Last data filed at 07/16/15 2131  Gross per 24 hour  Intake    363 ml  Output    400 ml  Net    -37 ml   Exam: No exam today   Data Reviewed:  Basic Metabolic Panel:  Recent Labs Lab 07/11/15 0339 07/13/15 0514 07/14/15 0426 07/15/15 0403  NA 142 145 142 144  K 4.3 4.9 4.4 4.5  CL 111 114* 111 111  CO2 20* _1 GLUCOSE 106* 136* 128* 117*  BUN 28* 28* 25* 25*  CREATININE 2.16* 2.03* 1.97* 2.01*  CALCIUM 8.5* 8.6* 8.6* 8.7*  MG  --  1.9 1.8 1.9    CBC:  Recent Labs Lab 07/11/15 0339 07/13/15 0514 07/14/15 0426 07/15/15 0403  WBC 5.6 6.4 5.9 6.2  NEUTROABS  --  5.2 5.0 4.7  HGB 8.6* 8.6* 8.2* 8.2*  HCT 27.8* 28.2* 26.9* 27.1*  MCV 82.7 84.4 84.9 84.7  PLT 214 221 221 220  Liver Function Tests: No results for input(s): AST, ALT, ALKPHOS, BILITOT, PROT, ALBUMIN in the last 168 hours. CBG:  Recent Labs Lab 07/16/15 1303 07/16/15 1703 07/16/15 2116 10-Aug-2015 0802 2015-08-10 1322  GLUCAP 162* 130* 158* 177* 138*    Recent Results (from the past 240 hour(s))  MRSA PCR Screening     Status: Abnormal   Collection Time: 07/05/2015  8:55 PM  Result Value Ref Range Status   MRSA by PCR POSITIVE (A) NEGATIVE Final    Comment:        The GeneXpert MRSA Assay (FDA approved for NASAL specimens only), is one component of  a comprehensive MRSA colonization surveillance program. It is not intended to diagnose MRSA infection nor to guide or monitor treatment for MRSA infections. RESULT CALLED TO, READ BACK BY AND VERIFIED WITH: L BUNN,RN_0  07/14/2015 MKELLY   Blood culture (routine x 2)     Status: None   Collection Time: 07/09/15 12:05 AM  Result Value Ref Range Status   Specimen Description BLOOD RIGHT ARM  Final   Special Requests BOTTLES DRAWN AEROBIC AND ANAEROBIC 5CC  Final   Culture NO GROWTH 5 DAYS  Final   Report Status 07/14/2015 FINAL  Final  Blood culture (routine x 2)     Status: None   Collection Time: 07/09/15 12:34 AM  Result Value Ref Range Status   Specimen Description BLOOD RIGHT HAND  Final   Special Requests IN PEDIATRIC BOTTLE 3CC  Final   Culture NO GROWTH 5 DAYS  Final   Report Status 07/14/2015 FINAL  Final  Respiratory virus panel     Status: None   Collection Time: 07/09/15  9:46 AM  Result Value Ref Range Status   Respiratory Syncytial Virus A Negative Negative Final   Respiratory Syncytial Virus B Negative Negative Final   Influenza A Negative Negative Final   Influenza B Negative Negative Final   Parainfluenza 1 Negative Negative Final   Parainfluenza 2 Negative Negative Final   Parainfluenza 3 Negative Negative Final   Metapneumovirus Negative Negative Final   Rhinovirus Negative Negative Final   Adenovirus Negative Negative Final    Comment: (NOTE) Performed At: [facility] [place], [place] [id number] Lindon Romp MD [id number]      Studies:   Recent x-ray studies have been reviewed in detail by the Attending Physician  Scheduled Meds:  Scheduled Meds: . brimonidine  1 drop Both Eyes Q12H   And  . timolol  1 drop Both Eyes Q12H  . ipratropium-albuterol  3 mL Nebulization QID  . sodium chloride flush  3 mL Intravenous Q12H    Time spent on care of this patient: no charge    Cherene Altes , MD   [facility] Office  [phone] Pager - Text Page per Amion as per below:  On-Call/Text Page:      [email or web address]      password [email or web address]  If 7PM-7AM, please contact night-coverage [email or web address] Password [email or web address] Aug 10, 2015, 2:50 PM   LOS: 9 days

## 2015-07-29 NOTE — Progress Notes (Signed)
Patient is currently off Bipap. 02 saturations in the lower 50's on 2L nasal cannula. Family is at bedside and are aware of pt's current status. Will continue to monitor

## 2015-07-29 DEATH — deceased

## 2015-08-29 NOTE — Discharge Summary (Addendum)
Death Summary  Hector Sloan [id number] DOB: [birth year] DOA: Jul 31, 2015  PCP: [name], MD  Admit date: 07/31/15 Date of Death: Aug 09, 2015  Final Diagnoses:  Pneumonia - Acute respiratory failure with hypoxia and hypercarbia - Possible mesothelioma Acute renal failure superimposed on stage 3 CKD DM2, uncontrolled, with renal complications R LE edema Chronic Normocytic anemia Left ventricular diastolic dysfunction (grade 1) - Moderate to severe pulmonary hypertension  MRSA screen +  History of present illness:  75 year old M Hx DM2 on insulin, CKD stage IV, Normocytic anemia, Chronic Diastolic CHF, and pulmonary hypertension who was discharged from this facility on 05/14/15 after admission for PNA. He was sent to a rehabilitation facility and eventually discharged home. Apparently throughout the duration of his time at the facility he was requiring nasal cannula oxygen but this was rapidly weaned and he was discharged home on room air. Patient reported that 3 days prior to discharge from rehabilitation he developed increased coughing and increasing shortness of breath and weakness.   Hospital Course:   Pneumonia - Acute respiratory failure with hypoxia and hypercarbia - Possible mesothelioma CT chest revealed extensive pleural/parenchymal lung disease with extensive pleural calcifications as well as complex fluid collections in the major fissure and bilateral pleural thickening, as well as nodular opacities at both lung bases - the pt was adamantly opposed to bx or further aggressive investigation - the decision was made to focus on comfort, with Palliative Care directing this phase of his care - the pt died peacefully while in the hospital   Acute renal failure superimposed on stage 3 CKD Baseline creatinine 2.0   DM2, uncontrolled, with renal complications [id number]   R LE edema Negative DVT / SVT   Chronic Normocytic anemia baseline hemoglobin 7-8 - related to  patient's chronic kidney disease - no evidence of acute blood loss   Left ventricular diastolic dysfunction (grade 1) - Moderate to severe pulmonary hypertension   MRSA screen +  Signed:  Kawehi Hostetter T  [facility] 08/07/2015, 6:18 PM

## 2018-02-16 IMAGING — CT CT HEAD W/O CM
4 series · 18 of 30 positions shown, 19 images · non-contrast
Comparison: None.

CLINICAL DATA: Metastatic cancer workup. History of bladder cancer.

EXAM:
CT HEAD WITHOUT CONTRAST
TECHNIQUE: Contiguous axial images were obtained from the base of the skull
through the vertex without intravenous contrast.

[Series 3: head bone · axial · 0.44mm/px · z∈[+1187,+1299]mm · 8 of 70 slices shown (1 of 2)]
[im 7/70  bone]
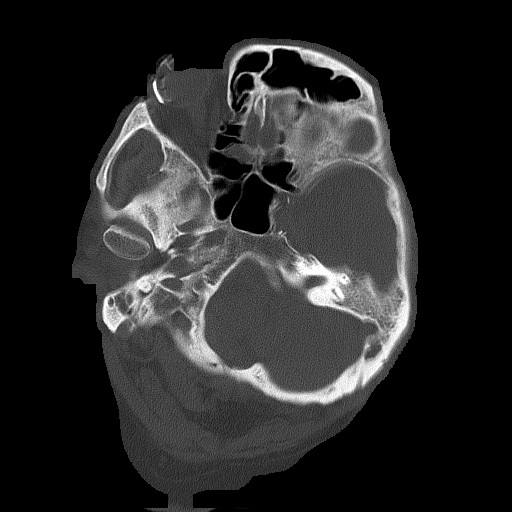
[im 14/70  bone]
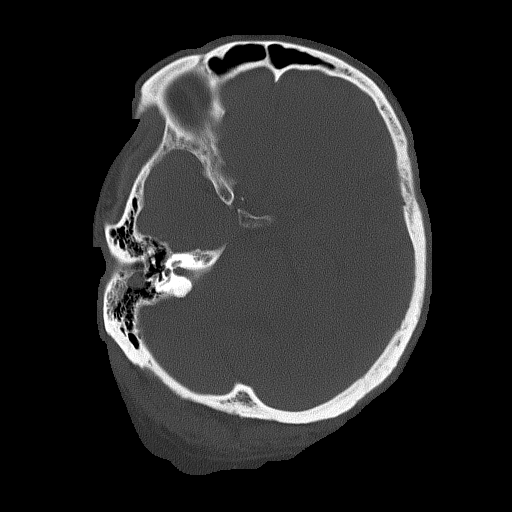
[im 21/70  bone]
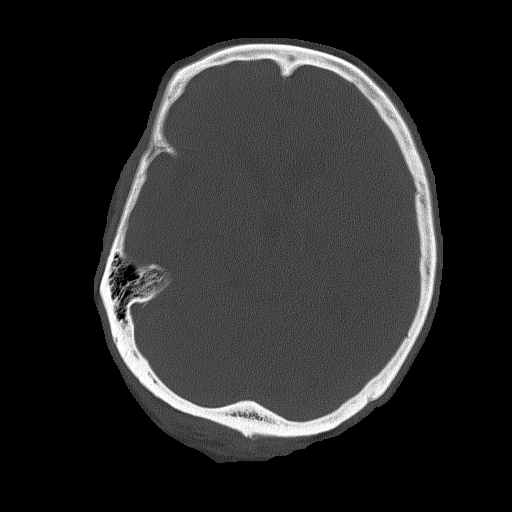
[im 28/70  bone]
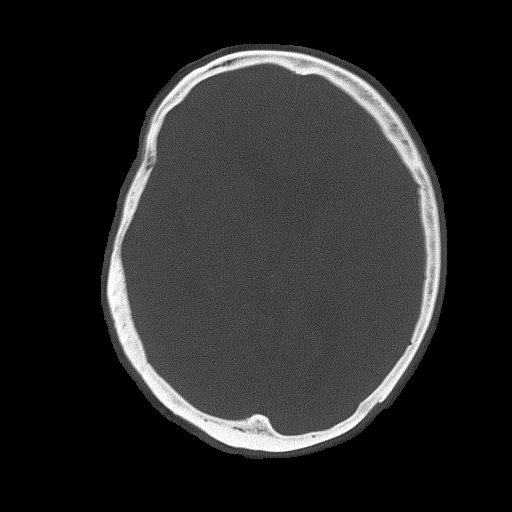
[im 42/70  bone]
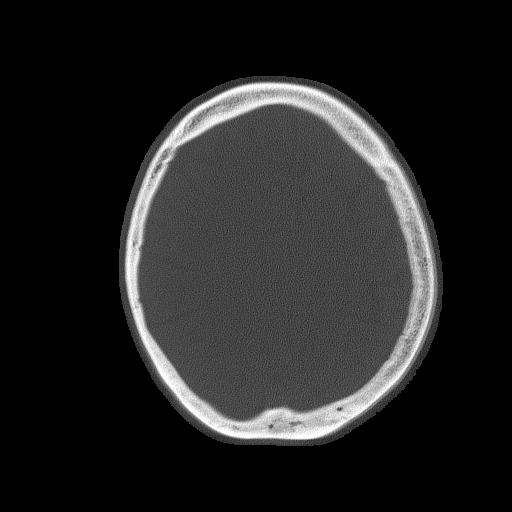
[im 49/70  bone]
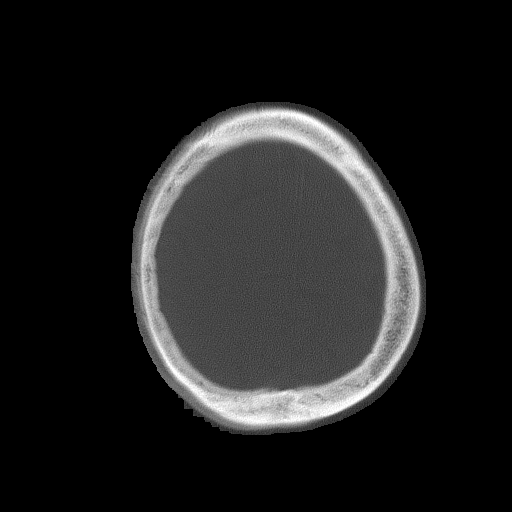
[im 56/70  bone]
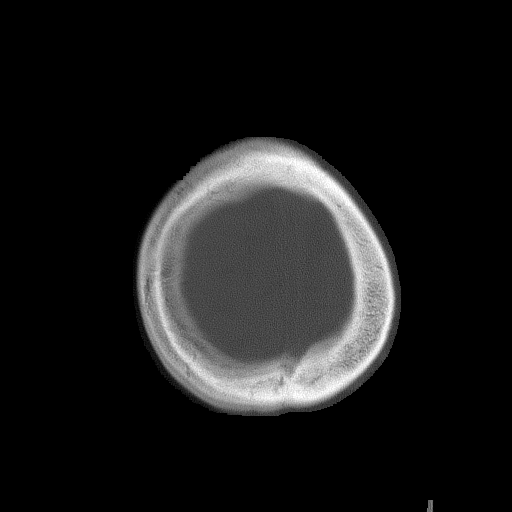
[im 63/70  bone]
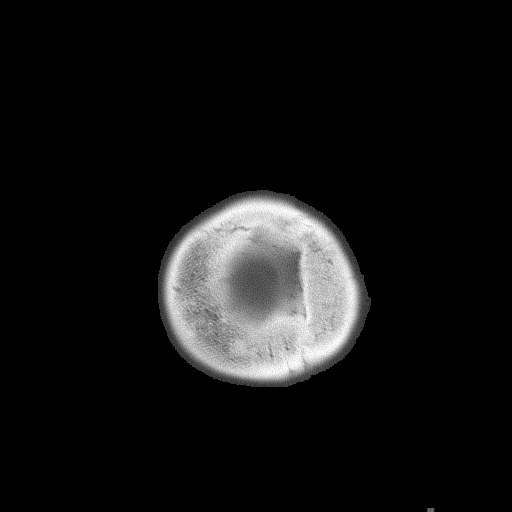

[Series 4: head without · axial · non-contrast · 0.44mm/px · z∈[+1220,+1265]mm · 2 of 28 slices shown, 3 images (1 of 2)]
[im 10/28  brain]
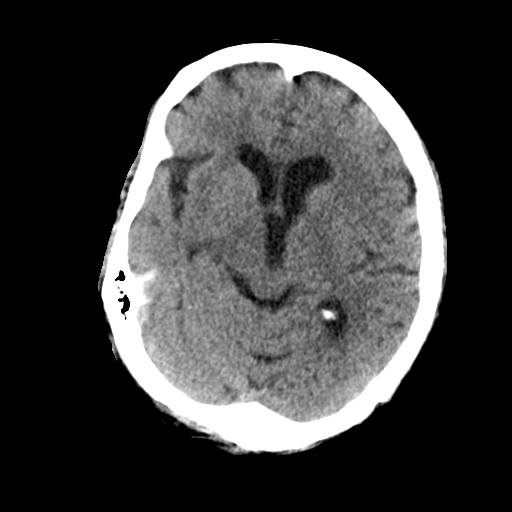
[im 10/28  bone]
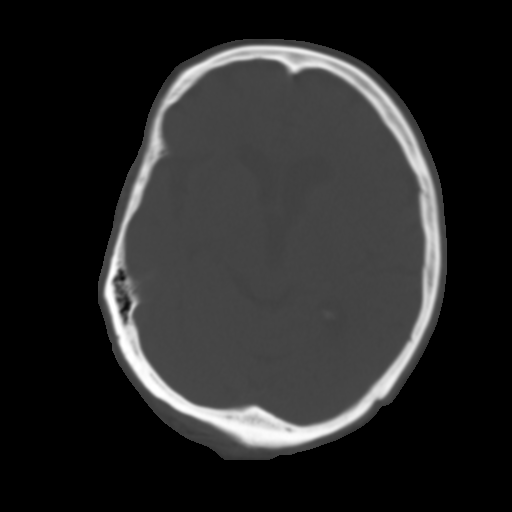
[im 19/28  brain]
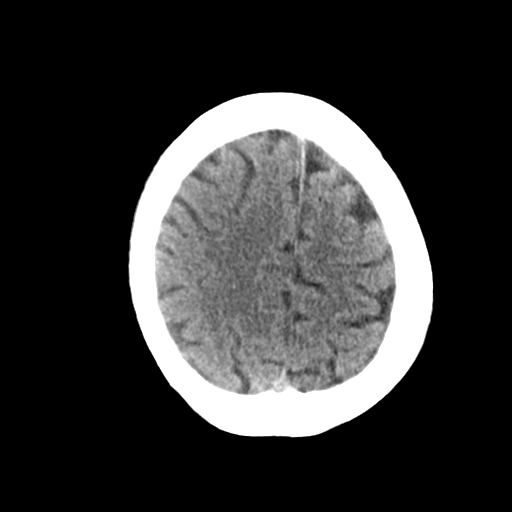

[Series 5: head without · axial · non-contrast · 0.44mm/px · z∈[+1220,+1265]mm · 2 of 28 slices shown (2 of 2)]
[im 10/28  brain]
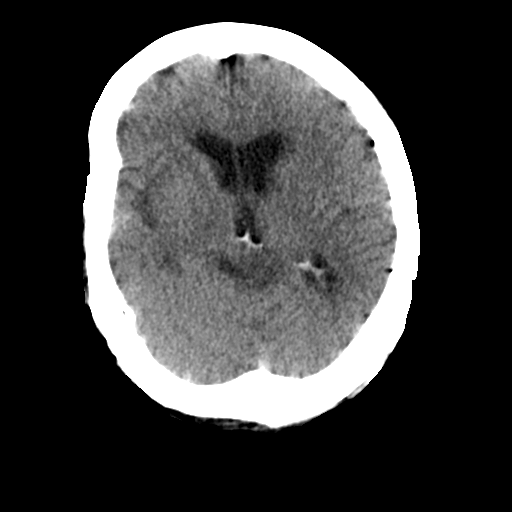
[im 19/28  brain]
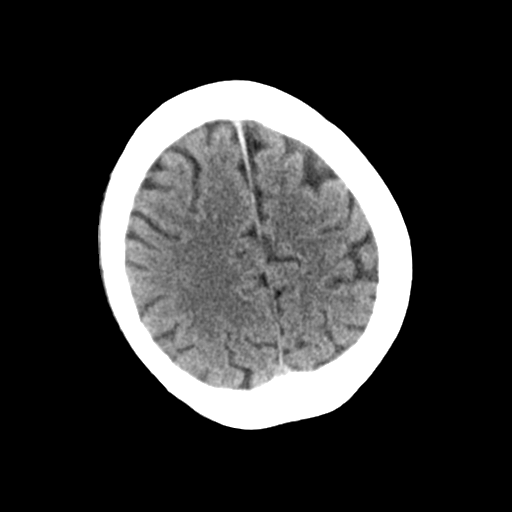

[Series 6: head bone · axial · 0.44mm/px · z∈[+1187,+1271]mm · 6 of 70 slices shown (2 of 2)]
[im 7/70  bone]
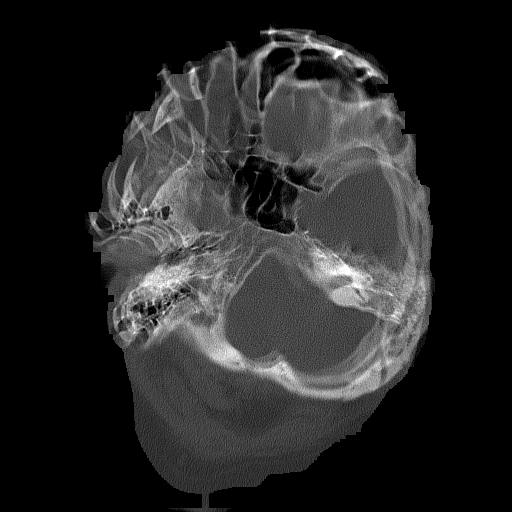
[im 14/70  bone]
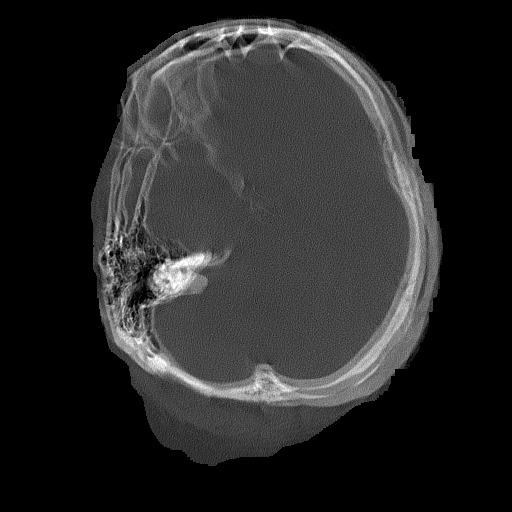
[im 21/70  bone]
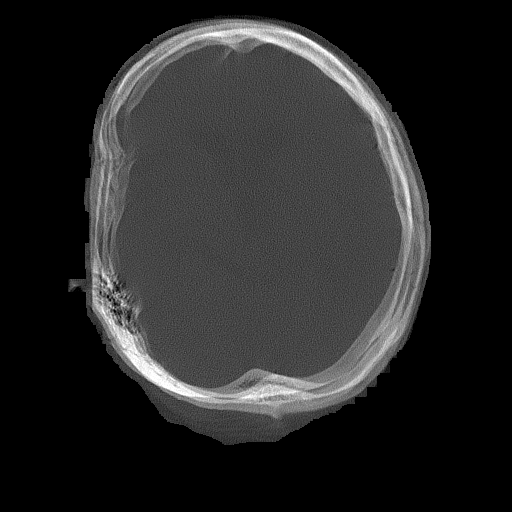
[im 28/70  bone]
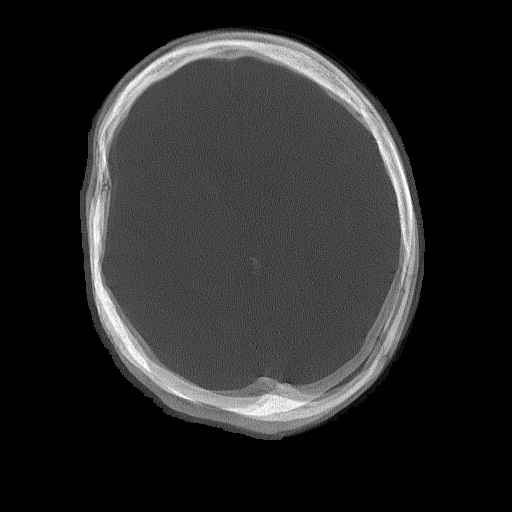
[im 42/70  bone]
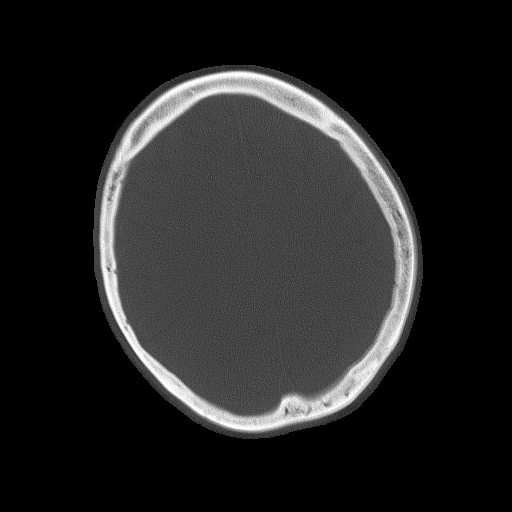
[im 49/70  bone]
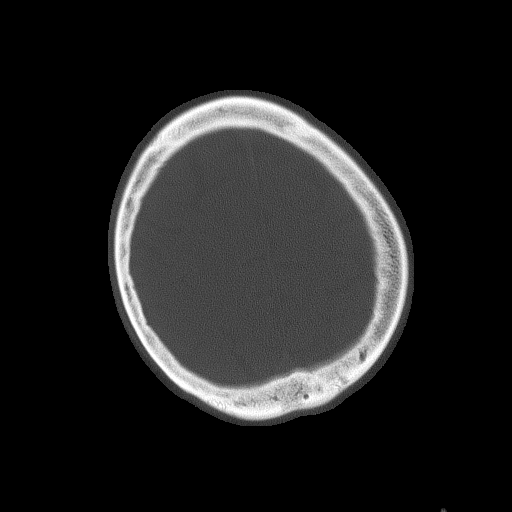

[18 of 30 positions shown; findings below may reference images not displayed]

FINDINGS: Study is motion degraded. Within this limitation, there is no
evidence for acute hemorrhage, hydrocephalus, mass lesion, or
abnormal extra-axial fluid collection. No CT evidence for acute
ischemia. Diffuse loss of parenchymal volume is consistent with
atrophy. Patchy low attenuation in the deep hemispheric and
periventricular white matter is nonspecific, but likely reflects
chronic microvascular ischemic demyelination. Postsurgical changes
noted in the globes bilaterally. The left mastoid air cells are
opacified.
IMPRESSION: 1. No acute intracranial abnormality. Specifically, no features on
this uninfused exam to suggest intracranial metastatic involvement.
2. Fluid in the left mastoid air cells.
